# Patient Record
Sex: Male | Born: 1935
Health system: Southern US, Community
[De-identification: ages and names within clinical notes are randomized; demographics above are authoritative.]

## PROBLEM LIST (undated history)

## (undated) DIAGNOSIS — F329 Major depressive disorder, single episode, unspecified: Secondary | ICD-10-CM

## (undated) DIAGNOSIS — C801 Malignant (primary) neoplasm, unspecified: Secondary | ICD-10-CM

## (undated) DIAGNOSIS — F32A Depression, unspecified: Secondary | ICD-10-CM

## (undated) DIAGNOSIS — I219 Acute myocardial infarction, unspecified: Secondary | ICD-10-CM

## (undated) DIAGNOSIS — I2510 Atherosclerotic heart disease of native coronary artery without angina pectoris: Secondary | ICD-10-CM

## (undated) DIAGNOSIS — Z87442 Personal history of urinary calculi: Secondary | ICD-10-CM

## (undated) DIAGNOSIS — K219 Gastro-esophageal reflux disease without esophagitis: Secondary | ICD-10-CM

## (undated) DIAGNOSIS — I10 Essential (primary) hypertension: Secondary | ICD-10-CM

## (undated) HISTORY — PX: OTHER SURGICAL HISTORY: SHX169

## (undated) HISTORY — PX: CORONARY ANGIOPLASTY: SHX604

---

## 2004-12-14 ENCOUNTER — Ambulatory Visit: Payer: Self-pay | Admitting: Urology

## 2004-12-16 ENCOUNTER — Ambulatory Visit: Payer: Self-pay | Admitting: Urology

## 2004-12-27 ENCOUNTER — Ambulatory Visit: Payer: Self-pay | Admitting: Internal Medicine

## 2005-04-14 ENCOUNTER — Ambulatory Visit: Payer: Self-pay | Admitting: Orthopaedic Surgery

## 2008-07-16 ENCOUNTER — Ambulatory Visit: Payer: Self-pay | Admitting: Cardiology

## 2009-01-26 ENCOUNTER — Ambulatory Visit: Payer: Self-pay | Admitting: Internal Medicine

## 2009-02-25 ENCOUNTER — Ambulatory Visit: Payer: Self-pay

## 2009-07-07 ENCOUNTER — Ambulatory Visit: Payer: Self-pay | Admitting: Orthopedic Surgery

## 2009-07-09 ENCOUNTER — Ambulatory Visit: Payer: Self-pay | Admitting: Orthopedic Surgery

## 2009-11-22 ENCOUNTER — Emergency Department: Payer: Self-pay | Admitting: Unknown Physician Specialty

## 2009-11-24 ENCOUNTER — Ambulatory Visit: Payer: Self-pay | Admitting: Urology

## 2009-12-03 ENCOUNTER — Ambulatory Visit: Payer: Self-pay | Admitting: Urology

## 2009-12-08 ENCOUNTER — Ambulatory Visit: Payer: Self-pay | Admitting: Urology

## 2010-05-31 ENCOUNTER — Ambulatory Visit: Payer: Self-pay | Admitting: Urology

## 2011-01-10 ENCOUNTER — Ambulatory Visit: Payer: Self-pay | Admitting: Cardiology

## 2012-01-31 ENCOUNTER — Inpatient Hospital Stay: Payer: Self-pay | Admitting: Cardiology

## 2012-01-31 LAB — CBC WITH DIFFERENTIAL/PLATELET
Basophil #: 0.1 10*3/uL (ref 0.0–0.1)
Eosinophil %: 3.5 %
HCT: 43 % (ref 40.0–52.0)
Lymphocyte #: 2.3 10*3/uL (ref 1.0–3.6)
MCH: 31.8 pg (ref 26.0–34.0)
MCV: 93 fL (ref 80–100)
Monocyte #: 1 x10 3/mm (ref 0.2–1.0)
Monocyte %: 10 %
Neutrophil #: 6.1 10*3/uL (ref 1.4–6.5)
Neutrophil %: 62.5 %
Platelet: 163 10*3/uL (ref 150–440)
RBC: 4.62 10*6/uL (ref 4.40–5.90)
RDW: 13.6 % (ref 11.5–14.5)
WBC: 9.7 10*3/uL (ref 3.8–10.6)

## 2012-01-31 LAB — BASIC METABOLIC PANEL
Anion Gap: 8 (ref 7–16)
BUN: 23 mg/dL — ABNORMAL HIGH (ref 7–18)
Co2: 28 mmol/L (ref 21–32)
Creatinine: 1.11 mg/dL (ref 0.60–1.30)
EGFR (African American): >60
EGFR (Non-African Amer.): >60
Glucose: 83 mg/dL (ref 65–99)
Osmolality: 288 (ref 275–301)
Sodium: 143 mmol/L (ref 136–145)

## 2012-01-31 LAB — CK TOTAL AND CKMB (NOT AT ARMC)
CK, Total: 84 U/L (ref 35–232)
CK-MB: 0.9 ng/mL (ref 0.5–3.6)

## 2012-02-01 LAB — BASIC METABOLIC PANEL
Anion Gap: 11 (ref 7–16)
BUN: 21 mg/dL — ABNORMAL HIGH (ref 7–18)
Chloride: 107 mmol/L (ref 98–107)
Creatinine: 0.95 mg/dL (ref 0.60–1.30)
EGFR (African American): >60
Glucose: 88 mg/dL (ref 65–99)
Osmolality: 286 (ref 275–301)
Potassium: 4.4 mmol/L (ref 3.5–5.1)

## 2012-02-01 LAB — CBC WITH DIFFERENTIAL/PLATELET
Basophil #: 0.1 10*3/uL (ref 0.0–0.1)
Eosinophil #: 0.4 10*3/uL (ref 0.0–0.7)
HGB: 14.1 g/dL (ref 13.0–18.0)
Lymphocyte %: 27.1 %
MCH: 32 pg (ref 26.0–34.0)
MCV: 93 fL (ref 80–100)
Monocyte #: 0.9 x10 3/mm (ref 0.2–1.0)
Neutrophil #: 5.8 10*3/uL (ref 1.4–6.5)
Neutrophil %: 59.5 %
Platelet: 151 10*3/uL (ref 150–440)
RBC: 4.41 10*6/uL (ref 4.40–5.90)
WBC: 9.8 10*3/uL (ref 3.8–10.6)

## 2012-02-01 LAB — CK TOTAL AND CKMB (NOT AT ARMC)
CK-MB: 0.8 ng/mL (ref 0.5–3.6)
CK-MB: 0.8 ng/mL (ref 0.5–3.6)

## 2013-03-04 DIAGNOSIS — N529 Male erectile dysfunction, unspecified: Secondary | ICD-10-CM | POA: Insufficient documentation

## 2013-03-04 DIAGNOSIS — Z87442 Personal history of urinary calculi: Secondary | ICD-10-CM | POA: Insufficient documentation

## 2014-01-02 DIAGNOSIS — Z9889 Other specified postprocedural states: Secondary | ICD-10-CM | POA: Insufficient documentation

## 2014-03-05 DIAGNOSIS — I10 Essential (primary) hypertension: Secondary | ICD-10-CM | POA: Insufficient documentation

## 2014-03-05 DIAGNOSIS — M5090 Cervical disc disorder, unspecified, unspecified cervical region: Secondary | ICD-10-CM | POA: Insufficient documentation

## 2014-03-05 DIAGNOSIS — E782 Mixed hyperlipidemia: Secondary | ICD-10-CM | POA: Insufficient documentation

## 2014-03-24 ENCOUNTER — Ambulatory Visit: Payer: Self-pay | Admitting: Cardiology

## 2014-05-28 DIAGNOSIS — E780 Pure hypercholesterolemia: Secondary | ICD-10-CM | POA: Diagnosis not present

## 2014-05-28 DIAGNOSIS — I2510 Atherosclerotic heart disease of native coronary artery without angina pectoris: Secondary | ICD-10-CM | POA: Diagnosis not present

## 2014-05-28 DIAGNOSIS — Z0000 Encounter for general adult medical examination without abnormal findings: Secondary | ICD-10-CM | POA: Diagnosis not present

## 2014-05-28 DIAGNOSIS — L570 Actinic keratosis: Secondary | ICD-10-CM | POA: Diagnosis not present

## 2014-07-29 DIAGNOSIS — I10 Essential (primary) hypertension: Secondary | ICD-10-CM | POA: Diagnosis not present

## 2014-07-29 DIAGNOSIS — R001 Bradycardia, unspecified: Secondary | ICD-10-CM | POA: Diagnosis not present

## 2014-07-29 DIAGNOSIS — I2510 Atherosclerotic heart disease of native coronary artery without angina pectoris: Secondary | ICD-10-CM | POA: Diagnosis not present

## 2014-07-29 DIAGNOSIS — Z9889 Other specified postprocedural states: Secondary | ICD-10-CM | POA: Diagnosis not present

## 2014-08-19 NOTE — H&P (Signed)
PATIENT NAME:  Patrick Wilson, Patrick Wilson MR#:  [id number] DATE OF BIRTH:  Nov 14, 1933  DATE OF ADMISSION:  01/31/2012  PRIMARY CARE PHYSICIAN:  Dr. Emily Filbert  CHIEF COMPLAINT: Chest pain.   HISTORY OF PRESENT ILLNESS: The patient is a 81 year old gentleman with known history of coronary artery disease admitted with a prolonged episode of chest pain. The patient has a history of coronary stent in the mid right coronary artery on 01/10/2011. The patient has had recent chest discomfort and underwent ETT sestamibi study 10/24/2011 which revealed apical wall ischemia. Cardiac catheterization was deferred at that time. Yesterday the patient was in his usual state of health and experienced substernal chest discomfort described as a pressure sensation. There was no associated radiation. This episode lasted for approximately two hours. The patient felt somewhat weak and lightheaded afterwards. He has some mild chest discomfort today but much less than yesterday.   PAST MEDICAL HISTORY:  1. Status post bare metal Vision stent mid right coronary artery on 01/10/2011.  2. Hyperlipidemia.   MEDICATIONS:  1. Aspirin 81 mg daily.  2. Clopidogrel 75 mg daily.  3. Lipitor 20 mg daily.  4. Imdur 30 mg daily.  5. Metoprolol succinate 50 mg daily.  6. Nitroglycerin 0.4 mg p.r.n.  7. Trazodone 50 mg at bedtime.  8. Omeprazole 20 mg daily.  9. Etodolac 400 mg b.i.d.  10. Celexa 20 mg daily.   SOCIAL HISTORY: The patient is married. He has two children. He quit smoking 30 years ago.   FAMILY HISTORY: The patient has a sister in her 3s status post myocardial infarction.   REVIEW OF SYSTEMS: CONSTITUTIONAL: No fever or chills. EYES: No blurry vision.  EARS: No hearing loss. RESPIRATORY: No shortness of breath. CARDIOVASCULAR: Chest pain as described above. GI: No nausea, vomiting, diarrhea, or constipation. GU: No dysuria or hematuria. ENDOCRINE: No polyuria or polydipsia. INTEGUMENT: No rash. MUSCULOSKELETAL: No  arthralgias or myalgias. NEUROLOGIC: No focal muscle weakness or numbness. PSYCH: No depression or anxiety.   PHYSICAL EXAMINATION:  VITAL SIGNS: Weight 179.6, height 5 feet 8 inches. BMI 27, blood pressure 160/80 right arm, 150/70 left arm, pulse 46 and regular.   HEENT: Pupils equal and reactive to light and accommodation.   NECK: Supple without thyromegaly.   LUNGS: Clear.   HEART: Normal jugular venous pressure. Normal point of maximal impulse. Regular rate and rhythm. Normal S1, S2. No appreciable gallop, murmur, or rub.   ABDOMEN: Soft and nontender. Pulses were intact bilaterally.   MUSCULOSKELETAL: Normal muscle tone.   NEUROLOGIC: The patient is alert and oriented times three. Motor and sensory both grossly intact.   ACCESSORY DATA:  EKG reveals sinus arrhythmia at 52 bpm with less than 1-mm of ST elevation in leads III and aVF.   IMPRESSION: This is a 81 year old gentleman with known coronary artery disease with recent stress test showing apical wall ischemia, who now presents with a two-hour episode of chest pain with some mild residual chest discomfort with subtle EKG changes suggestive of inferior wall infarct.   RECOMMENDATIONS:  1. Admit to 2A telemetry.  2. Start heparin bolus with drip per nomogram.  3. Cycle cardiac enzymes.  4. Proceed with cardiac catheterization with selective coronary arteriography on 02/01/2012. The risks, benefits, and alternatives were explained and informed written consent obtained.    ____________________________ Isaias Cowman, MD ap:bjt D: 01/31/2012 14:37:31 ET T: 01/31/2012 15:02:04 ET JOB#: [id number]  cc: Isaias Cowman, MD, <Dictator> Isaias Cowman MD ELECTRONICALLY SIGNED 02/17/2012 15:06

## 2014-08-19 NOTE — Discharge Summary (Signed)
PATIENT NAME:  Patrick Wilson, Patrick Wilson MR#:  [id number] DATE OF BIRTH:  August 10, 1933  DATE OF ADMISSION:  01/31/2012 DATE OF DISCHARGE:  02/02/2012  ADMITTING DIAGNOSIS: Unstable angina.   FINAL DIAGNOSES: 1. Coronary artery disease.  2. Hyperlipidemia.   MEDICATIONS:  1. Aspirin 81 mg daily.  2. Clopidogrel  75 mg daily.  3. Lipitor 30 mg daily.  4. Imdur 30 mg daily.  5. Amlodipine 5 mg daily.  6. Nitroglycerin 0.4 mg p.r.n.  7. Trazodone 50 mg at bedtime.  8. Omeprazole 20 mg daily.  9. Etodolac 400 mg b.i.d.  10. Celexa 20 mg daily.   HISTORY OF PRESENT ILLNESS: Please see admission History and Physical.   HOSPITAL COURSE: The patient was admitted on 01/31/2012 after a prolonged episode of chest pain. EKG showed suggestive changes in the inferior leads. The patient was admitted to telemetry where he ruled out for myocardial infarction by CPK isoenzymes and troponin. The patient underwent cardiac catheterization on 02/01/2012. Coronary arteriography revealed insignificant left coronary artery disease. There was 60% in-stent restenosis in the distal right coronary artery. Fractional flow reserve was performed, which did not reveal physiological obstruction to flow  with an FFR measurement of 0.92. Percutaneous coronary intervention was deferred. The patient was returned to telemetry. The patient was hypertensive during cardiac catheterization and amlodipine was added to his regimen.    The patient appears to be clinically stable and was discharged home. He is scheduled to see me in followup in 1 to 2 weeks.    ____________________________ Isaias Cowman, MD ap:bjt D: 02/02/2012 08:50:23 ET T: 02/02/2012 11:42:15 ET JOB#: [id number]  cc: Isaias Cowman, MD, <Dictator> Isaias Cowman MD ELECTRONICALLY SIGNED 02/17/2012 15:06

## 2014-11-19 DIAGNOSIS — L570 Actinic keratosis: Secondary | ICD-10-CM | POA: Diagnosis not present

## 2014-11-19 DIAGNOSIS — Z0000 Encounter for general adult medical examination without abnormal findings: Secondary | ICD-10-CM | POA: Diagnosis not present

## 2014-11-19 DIAGNOSIS — E780 Pure hypercholesterolemia: Secondary | ICD-10-CM | POA: Diagnosis not present

## 2014-11-19 DIAGNOSIS — I2510 Atherosclerotic heart disease of native coronary artery without angina pectoris: Secondary | ICD-10-CM | POA: Diagnosis not present

## 2014-11-26 DIAGNOSIS — Z79899 Other long term (current) drug therapy: Secondary | ICD-10-CM | POA: Diagnosis not present

## 2014-11-26 DIAGNOSIS — I2510 Atherosclerotic heart disease of native coronary artery without angina pectoris: Secondary | ICD-10-CM | POA: Diagnosis not present

## 2014-11-26 DIAGNOSIS — R079 Chest pain, unspecified: Secondary | ICD-10-CM | POA: Diagnosis not present

## 2015-01-09 DIAGNOSIS — I2510 Atherosclerotic heart disease of native coronary artery without angina pectoris: Secondary | ICD-10-CM | POA: Diagnosis not present

## 2015-01-09 DIAGNOSIS — E782 Mixed hyperlipidemia: Secondary | ICD-10-CM | POA: Diagnosis not present

## 2015-01-09 DIAGNOSIS — R001 Bradycardia, unspecified: Secondary | ICD-10-CM | POA: Diagnosis not present

## 2015-01-09 DIAGNOSIS — I10 Essential (primary) hypertension: Secondary | ICD-10-CM | POA: Diagnosis not present

## 2015-02-18 DIAGNOSIS — R21 Rash and other nonspecific skin eruption: Secondary | ICD-10-CM | POA: Diagnosis not present

## 2015-02-18 DIAGNOSIS — I952 Hypotension due to drugs: Secondary | ICD-10-CM | POA: Diagnosis not present

## 2015-02-18 DIAGNOSIS — R5382 Chronic fatigue, unspecified: Secondary | ICD-10-CM | POA: Diagnosis not present

## 2015-02-20 ENCOUNTER — Emergency Department: Payer: Commercial Managed Care - HMO

## 2015-02-20 ENCOUNTER — Emergency Department
Admission: EM | Admit: 2015-02-20 | Discharge: 2015-02-20 | Disposition: A | Discharge status: Home / Self Care | Payer: Commercial Managed Care - HMO | Attending: Student | Admitting: Student

## 2015-02-20 ENCOUNTER — Encounter: Payer: Self-pay | Admitting: *Deleted

## 2015-02-20 DIAGNOSIS — I10 Essential (primary) hypertension: Secondary | ICD-10-CM | POA: Insufficient documentation

## 2015-02-20 DIAGNOSIS — Z880 Allergy status to penicillin: Secondary | ICD-10-CM | POA: Diagnosis not present

## 2015-02-20 DIAGNOSIS — R42 Dizziness and giddiness: Secondary | ICD-10-CM

## 2015-02-20 DIAGNOSIS — R030 Elevated blood-pressure reading, without diagnosis of hypertension: Secondary | ICD-10-CM | POA: Diagnosis not present

## 2015-02-20 LAB — URINALYSIS COMPLETE WITH MICROSCOPIC (ARMC ONLY)
BACTERIA UA: NONE SEEN
Bilirubin Urine: NEGATIVE
GLUCOSE, UA: NEGATIVE mg/dL
Ketones, ur: NEGATIVE mg/dL
Leukocytes, UA: NEGATIVE
Nitrite: NEGATIVE
Protein, ur: NEGATIVE mg/dL
SQUAMOUS EPITHELIAL / LPF: NONE SEEN
Specific Gravity, Urine: 1.01 (ref 1.005–1.030)
pH: 7 (ref 5.0–8.0)

## 2015-02-20 LAB — CBC
HCT: 45.8 % (ref 40.0–52.0)
Hemoglobin: 15.3 g/dL (ref 13.0–18.0)
MCH: 31 pg (ref 26.0–34.0)
MCHC: 33.5 g/dL (ref 32.0–36.0)
MCV: 92.4 fL (ref 80.0–100.0)
PLATELETS: 186 10*3/uL (ref 150–440)
RBC: 4.96 MIL/uL (ref 4.40–5.90)
RDW: 13.3 % (ref 11.5–14.5)
WBC: 9.9 10*3/uL (ref 3.8–10.6)

## 2015-02-20 LAB — BASIC METABOLIC PANEL
Anion gap: 6 (ref 5–15)
BUN: 22 mg/dL — AB (ref 6–20)
CHLORIDE: 107 mmol/L (ref 101–111)
CO2: 27 mmol/L (ref 22–32)
CREATININE: 1.16 mg/dL (ref 0.61–1.24)
Calcium: 9.3 mg/dL (ref 8.9–10.3)
GFR calc Af Amer: >60 mL/min (ref >60)
GFR calc non Af Amer: 58 mL/min — ABNORMAL LOW (ref >60)
Glucose, Bld: 100 mg/dL — ABNORMAL HIGH (ref 65–99)
Potassium: 4.4 mmol/L (ref 3.5–5.1)
SODIUM: 140 mmol/L (ref 135–145)

## 2015-02-20 LAB — TROPONIN I: Troponin I: <0.03 ng/mL (ref <0.031)

## 2015-02-20 MED ORDER — HYDROCHLOROTHIAZIDE 12.5 MG PO CAPS: 12.5000 mg | ORAL_CAPSULE | Freq: Every day | ORAL | Status: DC

## 2015-02-20 NOTE — ED Provider Notes (Signed)
[facility] Emergency Department Provider Note  ____________________________________________  Time seen: Approximately 5:44 PM  I have reviewed the triage vital signs and the nursing notes.   HISTORY  Chief Complaint Hypertension    HPI Patrick Wilson is a 81 y.o. male with history of coronary artery disease status post stents who presents for evaluation of hypertension, lightheadedness, mild headache today, gradual onset, constant since onset. Currently his lightheadedness and headache have resolved. Hee reports that earlier today he began feeling slightly lightheaded, he took his blood pressure and it was elevated. He went to the fire department and it was 172/108 when it was checked. He had a mild headache which is now resolved. No chest pain, no difficulty breathing, no vision change, no numbness, weakness, speech difficulty. He has otherwise been in his usual state of health. No history of hypertension. No modifying factors. Currently he feels well.   No past medical history on file.  There are no active problems to display for this patient.   Past Surgical History  Procedure Laterality Date  . Cardiac stents      No current outpatient prescriptions on file.  Allergies Penicillins  No family history on file.  Social History Social History  Substance Use Topics  . Smoking status: Never Smoker   . Smokeless tobacco: None  . Alcohol Use: No    Review of Systems Constitutional: No fever/chills Eyes: No visual changes. ENT: No sore throat. Cardiovascular: Denies chest pain. Respiratory: Denies shortness of breath. Gastrointestinal: No abdominal pain.  No nausea, no vomiting.  No diarrhea.  No constipation. Genitourinary: Negative for dysuria. Musculoskeletal: Negative for back pain. Skin: Negative for rash. Neurological: Positive for mild headahce, no focal weakness or numbness.  10-point ROS otherwise  negative.  ____________________________________________   PHYSICAL EXAM:  VITAL SIGNS: ED Triage Vitals  Enc Vitals Group     BP 02/20/15 1544 158/87 mmHg     Pulse Rate 02/20/15 1544 81     Resp --      Temp 02/20/15 1544 97.3 F (36.3 C)     Temp Source 02/20/15 1544 Oral     SpO2 02/20/15 1544 97 %     Weight 02/20/15 1544 178 lb (80.74 kg)     Height 02/20/15 1544 5\' 8"  (1.727 m)     Head Cir --      Peak Flow --      Pain Score --      Pain Loc --      Pain Edu? --      Excl. in [place]? --     Constitutional: Alert and oriented. Well appearing and in no acute distress. Eyes: Conjunctivae are normal. PERRL. EOMI. Head: Atraumatic. Nose: No congestion/rhinnorhea. Mouth/Throat: Mucous membranes are moist.  Oropharynx non-erythematous. Neck: No stridor.   Cardiovascular: Normal rate, regular rhythm. Grossly normal heart sounds.  Good peripheral circulation. Respiratory: Normal respiratory effort.  No retractions. Lungs CTAB. Gastrointestinal: Soft and nontender. No distention. No abdominal bruits. No CVA tenderness. Genitourinary: deferred Musculoskeletal: No lower extremity tenderness nor edema.  No joint effusions. Neurologic:  Normal speech and language. No gross focal neurologic deficits are appreciated. No gait instability. 5 out of 5 strength in bilateral upper and lower extremities, sensation intact to light touch throughout, cranial nerves II through XII intact, normal finger-nose-finger. Normal ambulation. Skin:  Skin is warm, dry and intact. No rash noted. Psychiatric: Mood and affect are normal. Speech and behavior are normal.  ____________________________________________   LABS (all  labs ordered are listed, but only abnormal results are displayed)  Labs Reviewed  BASIC METABOLIC PANEL - Abnormal; Notable for the following:    Glucose, Bld 100 (*)    BUN 22 (*)    GFR calc non Af Amer 58 (*)    All other components within normal limits  CBC  TROPONIN I   URINALYSIS COMPLETEWITH MICROSCOPIC ([facility] ONLY)  CBG MONITORING, ED   ____________________________________________  EKG  ED ECG REPORT I, Joanne Gavel, the attending physician, personally viewed and interpreted this ECG.   Date: 02/20/2015  EKG Time: 15:50  Rate: 74  Rhythm: sinus rhythm with 1st degree AV block  Axis: normal  Intervals:none  ST&T Change: No acute ST elevation.  ____________________________________________  RADIOLOGY  CXR  IMPRESSION: No acute abnormalities.   CT head  IMPRESSION: No evidence of acute intracranial abnormality.  Mild small vessel ischemic changes.  ____________________________________________   PROCEDURES  Procedure(s) performed: None  Critical Care performed: No  ____________________________________________   INITIAL IMPRESSION / ASSESSMENT AND PLAN / ED COURSE  Pertinent labs & imaging results that were available during my care of the patient were reviewed by me and considered in my medical decision making (see chart for details).  Patrick Wilson is a 81 y.o. male with history of coronary artery disease status post stents who presents for evaluation of hypertension, lightheadedness, mild headache today, gradual onset, constant since onset. On exam, he is very well-appearing and in no acute distress. Blood pressure 158/87 without any intervention in the ER. He is afebrile and vital signs are otherwise stable. He has an intact neurological exam and no planes at this time. EKG reassuring. Labs reviewed. Normal BMP, normal CBC, troponin negative. Urinalysis without proteinuria. Chest x-ray clear. CT head negative for any acute intracranial process. Suspect mild hypertension without any evidence of end organ dysfunction. Will start on low-dose HCTZ. Discussed return precautions and he will follow-up with his primary care doctor on Monday morning. He and his family at bedside are comfortable with the discharge  plan. ____________________________________________   FINAL CLINICAL IMPRESSION(S) / ED DIAGNOSES  Final diagnoses:  Essential hypertension  Lightheadedness      Joanne Gavel, MD 02/20/15 1929

## 2015-02-20 NOTE — ED Notes (Signed)
Pt reports feeling weak and reports "stubbling" while at home. Pt denies dizziness at this time. Pt is talking and joking with staff and presents in no acute distress.

## 2015-02-20 NOTE — ED Notes (Signed)
Pt reports being light headed, pt went to the fire station had blood check ; 172/108, pt denies a history of hypertension

## 2015-02-23 DIAGNOSIS — I10 Essential (primary) hypertension: Secondary | ICD-10-CM | POA: Diagnosis not present

## 2015-04-17 DIAGNOSIS — J0110 Acute frontal sinusitis, unspecified: Secondary | ICD-10-CM | POA: Diagnosis not present

## 2015-04-17 DIAGNOSIS — I10 Essential (primary) hypertension: Secondary | ICD-10-CM | POA: Diagnosis not present

## 2015-05-19 DIAGNOSIS — R001 Bradycardia, unspecified: Secondary | ICD-10-CM | POA: Diagnosis not present

## 2015-05-19 DIAGNOSIS — Z9889 Other specified postprocedural states: Secondary | ICD-10-CM | POA: Diagnosis not present

## 2015-05-19 DIAGNOSIS — R079 Chest pain, unspecified: Secondary | ICD-10-CM | POA: Diagnosis not present

## 2015-05-19 DIAGNOSIS — I2510 Atherosclerotic heart disease of native coronary artery without angina pectoris: Secondary | ICD-10-CM | POA: Diagnosis not present

## 2015-05-19 DIAGNOSIS — I10 Essential (primary) hypertension: Secondary | ICD-10-CM | POA: Diagnosis not present

## 2015-05-19 DIAGNOSIS — E782 Mixed hyperlipidemia: Secondary | ICD-10-CM | POA: Diagnosis not present

## 2015-05-22 DIAGNOSIS — R079 Chest pain, unspecified: Secondary | ICD-10-CM | POA: Diagnosis not present

## 2015-05-22 DIAGNOSIS — I2510 Atherosclerotic heart disease of native coronary artery without angina pectoris: Secondary | ICD-10-CM | POA: Diagnosis not present

## 2015-05-28 DIAGNOSIS — Z125 Encounter for screening for malignant neoplasm of prostate: Secondary | ICD-10-CM | POA: Diagnosis not present

## 2015-05-28 DIAGNOSIS — Z79899 Other long term (current) drug therapy: Secondary | ICD-10-CM | POA: Diagnosis not present

## 2015-06-02 DIAGNOSIS — E782 Mixed hyperlipidemia: Secondary | ICD-10-CM | POA: Diagnosis not present

## 2015-06-02 DIAGNOSIS — R001 Bradycardia, unspecified: Secondary | ICD-10-CM | POA: Diagnosis not present

## 2015-06-02 DIAGNOSIS — I10 Essential (primary) hypertension: Secondary | ICD-10-CM | POA: Diagnosis not present

## 2015-06-02 DIAGNOSIS — R0602 Shortness of breath: Secondary | ICD-10-CM | POA: Diagnosis not present

## 2015-06-02 DIAGNOSIS — R079 Chest pain, unspecified: Secondary | ICD-10-CM | POA: Diagnosis not present

## 2015-06-02 DIAGNOSIS — I2510 Atherosclerotic heart disease of native coronary artery without angina pectoris: Secondary | ICD-10-CM | POA: Diagnosis not present

## 2015-06-02 DIAGNOSIS — E7800 Pure hypercholesterolemia, unspecified: Secondary | ICD-10-CM | POA: Diagnosis not present

## 2015-06-02 DIAGNOSIS — Z9889 Other specified postprocedural states: Secondary | ICD-10-CM | POA: Diagnosis not present

## 2015-06-04 DIAGNOSIS — Z0000 Encounter for general adult medical examination without abnormal findings: Secondary | ICD-10-CM | POA: Diagnosis not present

## 2015-07-10 DIAGNOSIS — Z9889 Other specified postprocedural states: Secondary | ICD-10-CM | POA: Diagnosis not present

## 2015-07-10 DIAGNOSIS — I2510 Atherosclerotic heart disease of native coronary artery without angina pectoris: Secondary | ICD-10-CM | POA: Diagnosis not present

## 2015-07-10 DIAGNOSIS — I10 Essential (primary) hypertension: Secondary | ICD-10-CM | POA: Diagnosis not present

## 2015-07-10 DIAGNOSIS — E782 Mixed hyperlipidemia: Secondary | ICD-10-CM | POA: Diagnosis not present

## 2015-10-02 DIAGNOSIS — I2510 Atherosclerotic heart disease of native coronary artery without angina pectoris: Secondary | ICD-10-CM | POA: Diagnosis not present

## 2015-10-02 DIAGNOSIS — I10 Essential (primary) hypertension: Secondary | ICD-10-CM | POA: Diagnosis not present

## 2015-10-02 DIAGNOSIS — Z9889 Other specified postprocedural states: Secondary | ICD-10-CM | POA: Diagnosis not present

## 2015-11-13 ENCOUNTER — Ambulatory Visit
Admission: RE | Admit: 2015-11-13 | Discharge: 2015-11-13 | Disposition: A | Discharge status: Home / Self Care | Payer: Commercial Managed Care - HMO | Source: Ambulatory Visit | Attending: Urology | Admitting: Urology

## 2015-11-13 ENCOUNTER — Ambulatory Visit (INDEPENDENT_AMBULATORY_CARE_PROVIDER_SITE_OTHER): Payer: Commercial Managed Care - HMO | Admitting: Urology

## 2015-11-13 VITALS — Ht 68.0 in | Wt 176.4 lb

## 2015-11-13 DIAGNOSIS — R109 Unspecified abdominal pain: Secondary | ICD-10-CM | POA: Insufficient documentation

## 2015-11-13 DIAGNOSIS — Z87442 Personal history of urinary calculi: Secondary | ICD-10-CM

## 2015-11-13 DIAGNOSIS — N200 Calculus of kidney: Secondary | ICD-10-CM

## 2015-11-13 DIAGNOSIS — I7090 Unspecified atherosclerosis: Secondary | ICD-10-CM | POA: Insufficient documentation

## 2015-11-13 LAB — URINALYSIS, COMPLETE
BILIRUBIN UA: NEGATIVE
Glucose, UA: NEGATIVE
Leukocytes, UA: NEGATIVE
Nitrite, UA: NEGATIVE
Specific Gravity, UA: 1.025 (ref 1.005–1.030)
UUROB: 0.2 mg/dL (ref 0.2–1.0)
pH, UA: 6 (ref 5.0–7.5)

## 2015-11-13 LAB — MICROSCOPIC EXAMINATION

## 2015-11-13 MED ORDER — OXYCODONE-ACETAMINOPHEN 5-325 MG PO TABS: 1.0000 | ORAL_TABLET | ORAL | Status: DC | PRN

## 2015-11-13 MED ORDER — TAMSULOSIN HCL 0.4 MG PO CAPS: 0.4000 mg | ORAL_CAPSULE | Freq: Every day | ORAL | Status: DC

## 2015-11-13 NOTE — Progress Notes (Signed)
11/13/2015 12:51 PM   Patrick Wilson 05/10/1933 [id number]  Referring provider: Rusty Aus, MD [facility] [place], [place] [address]  Chief Complaint  Patient presents with  . New Patient (Initial Visit)    Renal Stone    HPI: The patient is a 82 year old gentleman with a past medical history of nephrolithiasis who 2 weeks ago felt like he was passing a stone on the left side. He felt intense sharp pain in the left side which is since resolved. He said stones 3 times in the past. 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 He also has had laser lithotripsy 1 and ESWL times one.  X-ray today shows a stone approximately 5 mm in size on the left lateral to L4.He also has nonobstructing left renal stones.  PMH: No past medical history on file.  Surgical History: Past Surgical History  Procedure Laterality Date  . Cardiac stents      Home Medications:    Medication List       This list is accurate as of: 11/13/15 12:51 PM.  Always use your most recent med list.               amLODipine 5 MG tablet  Commonly known as:  NORVASC  Take 5 mg by mouth at bedtime.     aspirin EC 81 MG tablet  Take 81 mg by mouth daily.     atorvastatin 20 MG tablet  Commonly known as:  LIPITOR  Take 20 mg by mouth at bedtime.     clopidogrel 75 MG tablet  Commonly known as:  PLAVIX  Take 75 mg by mouth daily.     Denture Adhesive Crea     hydrochlorothiazide 12.5 MG capsule  Commonly known as:  MICROZIDE  Take 1 capsule (12.5 mg total) by mouth daily.     isosorbide mononitrate 60 MG 24 hr tablet  Commonly known as:  IMDUR  Take by mouth. Reported on 11/13/2015     lisinopril-hydrochlorothiazide 20-12.5 MG tablet  Commonly known as:  PRINZIDE,ZESTORETIC  Take by mouth.     meloxicam 15 MG tablet  Commonly known as:  MOBIC  Take 15 mg by mouth at bedtime.     nitroGLYCERIN 0.4 MG SL tablet  Commonly known as:  NITROSTAT  Place 0.4 mg under the tongue every 5 (five) minutes as needed for chest pain. Reported on 11/13/2015     omeprazole 20 MG capsule  Commonly known as:  PRILOSEC  Take 20 mg by mouth at bedtime.     oxyCODONE-acetaminophen 5-325 MG tablet  Commonly known as:  ROXICET  Take 1 tablet by mouth every 4 (four) hours as needed for severe pain.     Phenylephrine-Acetaminophen 5-325 MG Tabs     tamsulosin 0.4 MG Caps capsule  Commonly known as:  FLOMAX  Take 1 capsule (0.4 mg total) by mouth daily.     traZODone 50 MG tablet  Commonly known as:  DESYREL  Take 50 mg by mouth at bedtime.     VITAMIN B COMPLEX PO        Allergies:  Allergies  Allergen Reactions  . Penicillins Rash and Other (See Comments)    Has patient had a PCN reaction causing immediate rash, facial/tongue/throat swelling, SOB or lightheadedness with hypotension: No Has patient had a PCN reaction causing severe rash involving mucus membranes or skin necrosis:  No Has patient had a PCN reaction that required hospitalization No Has patient had a PCN reaction occurring within the last 10 years: No If all of the above answers are "NO", then may proceed with Cephalosporin use.    Family History: No family history on file.  Social History:  reports that he has never smoked. He does not have any smokeless tobacco history on file. He reports that he does not drink alcohol. His drug history is not on file.  ROS: UROLOGY Frequent Urination?: Yes Hard to postpone urination?: Yes Burning/pain with urination?: No Get up at night to urinate?: Yes Leakage of urine?: Yes Urine stream starts and stops?: Yes Trouble starting stream?: No Do you have to strain to urinate?: No Blood in urine?: No Urinary tract infection?: No Sexually transmitted disease?: No Injury to kidneys or bladder?: No Painful intercourse?: No Weak stream?: No Erection problems?:  Yes Penile pain?: No  Gastrointestinal Nausea?: No Vomiting?: No Indigestion/heartburn?: No Diarrhea?: No Constipation?: Yes  Constitutional Fever: No Night sweats?: No Weight loss?: No Fatigue?: Yes  Skin Skin rash/lesions?: No Itching?: Yes  Eyes Blurred vision?: No Double vision?: No  Ears/Nose/Throat Sore throat?: No Sinus problems?: No  Hematologic/Lymphatic Swollen glands?: No Easy bruising?: No  Cardiovascular Leg swelling?: No Chest pain?: Yes  Respiratory Cough?: No Shortness of breath?: No  Endocrine Excessive thirst?: No  Musculoskeletal Back pain?: Yes Joint pain?: No  Neurological Headaches?: No Dizziness?: No  Psychologic Depression?: No Anxiety?: No  Physical Exam: Ht 5\' 8"  (1.727 m)  Wt 176 lb 6.4 oz (80.015 kg)  BMI 26.83 kg/m2  Constitutional:  Alert and oriented, No acute distress. HEENT: [place] AT, moist mucus membranes.  Trachea midline, no masses. Cardiovascular: No clubbing, cyanosis, or edema. Respiratory: Normal respiratory effort, no increased work of breathing. GI: Abdomen is soft, nontender, nondistended, no abdominal masses GU: No CVA tenderness.  Skin: No rashes, bruises or suspicious lesions. Lymph: No cervical or inguinal adenopathy. Neurologic: Grossly intact, no focal deficits, moving all 4 extremities. Psychiatric: Normal mood and affect.  Laboratory Data: Lab Results  Component Value Date   WBC 9.9 02/20/2015   HGB 15.3 02/20/2015   HCT 45.8 02/20/2015   MCV 92.4 02/20/2015   PLT 186 02/20/2015    Lab Results  Component Value Date   CREATININE 1.16 02/20/2015    No results found for: PSA  No results found for: TESTOSTERONE  No results found for: HGBA1C  Urinalysis    Component Value Date/Time   COLORURINE STRAW* 02/20/2015 1842   APPEARANCEUR CLEAR* 02/20/2015 1842   LABSPEC 1.010 02/20/2015 1842   PHURINE 7.0 02/20/2015 1842   GLUCOSEU NEGATIVE 02/20/2015 1842   HGBUR 1+* 02/20/2015  1842   BILIRUBINUR NEGATIVE 02/20/2015 1842   KETONESUR NEGATIVE 02/20/2015 1842   PROTEINUR NEGATIVE 02/20/2015 1842   NITRITE NEGATIVE 02/20/2015 1842   LEUKOCYTESUR NEGATIVE 02/20/2015 1842    Pertinent Imaging: X-ray today shows a stone approximately 5 mm in size on the left lateral to L4.He also has nonobstructing left renal stones.  Assessment & Plan:   1. Left ureteral stone 2. Left renal stone 3. Microscopic hematuria The patient has a left ureteral stone approximately 3 mm in width and 6 mm in length. I discussed treatment options which include watchful waiting with medical expulsive therapy, ureteroscopy, lithotripsy. He also has nonobstructing left renal stones. I did discuss ureteroscopy is awaited render him stone free. He has had this in the past and did not tolerate the procedure well  especially the stent afterwards. He is interested in medical expulsive therapy this time. He was given Flomax, pain medications, and a strainer. He will in the office if he develops unexpected fevers. He becomes uncontrolled. He'll follow-up in 2 weeks for KUB. He may decide intervention around that time he does not pass the stone.  His minimal microscopic hematuria is likely from the stone that he is currently passing   Return in about 2 weeks (around 11/27/2015) for KUB prior.  Nickie Retort, MD  [facility] [address], [place], [place] [address]  [phone]

## 2015-11-22 ENCOUNTER — Emergency Department
Admission: EM | Admit: 2015-11-22 | Discharge: 2015-11-23 | Disposition: A | Discharge status: Home / Self Care | Payer: Commercial Managed Care - HMO | Attending: Emergency Medicine | Admitting: Emergency Medicine

## 2015-11-22 DIAGNOSIS — I2510 Atherosclerotic heart disease of native coronary artery without angina pectoris: Secondary | ICD-10-CM | POA: Insufficient documentation

## 2015-11-22 DIAGNOSIS — I10 Essential (primary) hypertension: Secondary | ICD-10-CM | POA: Insufficient documentation

## 2015-11-22 DIAGNOSIS — N200 Calculus of kidney: Secondary | ICD-10-CM

## 2015-11-22 DIAGNOSIS — Z79899 Other long term (current) drug therapy: Secondary | ICD-10-CM | POA: Insufficient documentation

## 2015-11-22 DIAGNOSIS — R109 Unspecified abdominal pain: Secondary | ICD-10-CM | POA: Diagnosis not present

## 2015-11-22 DIAGNOSIS — Z7982 Long term (current) use of aspirin: Secondary | ICD-10-CM | POA: Diagnosis not present

## 2015-11-22 LAB — BASIC METABOLIC PANEL
Anion gap: 7 (ref 5–15)
BUN: 31 mg/dL — AB (ref 6–20)
CALCIUM: 9 mg/dL (ref 8.9–10.3)
CO2: 25 mmol/L (ref 22–32)
CREATININE: 1.33 mg/dL — AB (ref 0.61–1.24)
Chloride: 108 mmol/L (ref 101–111)
GFR calc Af Amer: 57 mL/min — ABNORMAL LOW (ref >60)
GFR calc non Af Amer: 49 mL/min — ABNORMAL LOW (ref >60)
GLUCOSE: 126 mg/dL — AB (ref 65–99)
Potassium: 3.6 mmol/L (ref 3.5–5.1)
Sodium: 140 mmol/L (ref 135–145)

## 2015-11-22 LAB — CBC
HCT: 42 % (ref 40.0–52.0)
Hemoglobin: 14.3 g/dL (ref 13.0–18.0)
MCH: 31.5 pg (ref 26.0–34.0)
MCHC: 34 g/dL (ref 32.0–36.0)
MCV: 92.8 fL (ref 80.0–100.0)
PLATELETS: 173 10*3/uL (ref 150–440)
RBC: 4.53 MIL/uL (ref 4.40–5.90)
RDW: 13.6 % (ref 11.5–14.5)
WBC: 9.6 10*3/uL (ref 3.8–10.6)

## 2015-11-22 MED ORDER — FENTANYL CITRATE (PF) 100 MCG/2ML IJ SOLN: 50.0000 ug | INTRAMUSCULAR | Status: DC | PRN

## 2015-11-22 MED ORDER — FENTANYL CITRATE (PF) 100 MCG/2ML IJ SOLN
INTRAMUSCULAR | Status: AC
  Filled 2015-11-22: qty 2

## 2015-11-22 NOTE — ED Triage Notes (Signed)
Paient reports he was told last week that he had a kidney stone.  Reports started with left flank pain today.

## 2015-11-22 NOTE — ED Notes (Signed)
Urine sent to lab 

## 2015-11-22 NOTE — ED Provider Notes (Signed)
[facility] Emergency Department Provider Note   ____________________________________________  Time seen: Approximately 11:24 PM  I have reviewed the triage vital signs and the nursing notes.   HISTORY  Chief Complaint Flank Pain    HPI DESSIE ROELOFS is a 82 y.o. male who comes into the hospital today with kidney stones. The patient reports this pain started at 5:30 to 6 PM. He reports that he has been seen by his urologist and diagnosed. He has about 5-7 stones on his left kidney. He reports that there anywhere from 2-6 mm in size. The patient reports that he has a plan to get x-rayed again next week he has not been taking anything for pain. The pain and not really been bothering him much until today. The patient has a prescription waiting for him at [facility] but did not take any medication for pain tonight and did not fill his prescriptions. Currently the patient's pain is a 0 out of 10 in intensity. He has some mild discomfort across his lower back. Reports is not that bad. The patient denies pain with urination and denies any visible blood in his urine. The patient reports that he did have some when he saw his urologist. He denies any nausea, vomiting, chest pain, shortness of breath, headache.   Past medical history Kidney stones Arthritis CAD  Patient Active Problem List   Diagnosis Date Noted  . Arterial vascular disease 11/13/2015  . Calculus of kidney 11/13/2015  . Cervical disc disease 03/05/2014  . Combined fat and carbohydrate induced hyperlipemia 03/05/2014  . Essential (primary) hypertension 03/05/2014  . History of cardiac catheterization 01/02/2014  . ED (erectile dysfunction) of organic origin 03/04/2013  . H/O urinary stone 03/04/2013    Past Surgical History:  Procedure Laterality Date  . cardiac stents          Lithotripsy       Kidney surgery  Current Outpatient Rx  . Order #: [id number] Class: Historical Med  . Order #:  [id number] Class: Historical Med  . Order #: [id number] Class: Historical Med  . Order #: [id number] Class: Historical Med  . Order #: [id number] Class: Historical Med  . Order #: [id number] Class: Historical Med  . Order #: [id number] Class: Print  . Order #: [id number] Class: Historical Med  . Order #: [id number] Class: Historical Med  . Order #: [id number] Class: Historical Med  . Order #: [id number] Class: Historical Med  . Order #: [id number] Class: Historical Med  . Order #: [id number] Class: Print  . Order #: [id number] Class: Historical Med  . Order #: [id number] Class: Print  . Order #: [id number] Class: Historical Med    Allergies Penicillins  No family history on file.  Social History Social History  Substance Use Topics  . Smoking status: Never Smoker  . Smokeless tobacco: Not on file  . Alcohol use No    Review of Systems Constitutional: No fever/chills Eyes: No visual changes. ENT: No sore throat. Cardiovascular: Denies chest pain. Respiratory: Denies shortness of breath. Gastrointestinal: No abdominal pain.  No nausea, no vomiting.  No diarrhea.  No constipation. Genitourinary: Negative for dysuria. Musculoskeletal: Left flank pain Skin: Negative for rash. Neurological: Negative for headaches, focal weakness or numbness.  10-point ROS otherwise negative.  ____________________________________________   PHYSICAL EXAM:  VITAL SIGNS: ED Triage Vitals  Enc Vitals Group     BP 11/22/15 2014 (!) 148/112     Pulse Rate 11/22/15 2014 75     Resp 11/22/15 2014 (!) 22     Temp 11/22/15 2014 97.8 F (36.6  C)     Temp Source 11/22/15 2014 Oral     SpO2 11/22/15 2014 98 %     Weight 11/22/15 2014 178 lb (80.7 kg)     Height 11/22/15 2014 5\' 8"  (1.727 m)     Head Circumference --      Peak Flow --      Pain Score 11/22/15 2015 10     Pain Loc --      Pain Edu? --      Excl. in [place]? --     Constitutional: Alert and oriented. Well appearing and in no acute distress. Eyes:  Conjunctivae are normal. PERRL. EOMI. Head: Atraumatic. Nose: No congestion/rhinnorhea. Mouth/Throat: Mucous membranes are moist.  Oropharynx non-erythematous. Cardiovascular: Normal rate, regular rhythm. Grossly normal heart sounds.  Good peripheral circulation. Respiratory: Normal respiratory effort.  No retractions. Lungs CTAB. Gastrointestinal: Soft and nontender. No distention. Mild left CVA tenderness to palpation Musculoskeletal: No lower extremity tenderness nor edema.   Neurologic:  Normal speech and language.  Skin:  Skin is warm, dry and intact.  Psychiatric: Mood and affect are normal.   ____________________________________________   LABS (all labs ordered are listed, but only abnormal results are displayed)  Labs Reviewed  URINALYSIS COMPLETEWITH MICROSCOPIC ([place]) - Abnormal; Notable for the following:       Result Value   Color, Urine YELLOW (*)    APPearance CLEAR (*)    Hgb urine dipstick 3+ (*)    Protein, ur 30 (*)    Squamous Epithelial / LPF 0-5 (*)    All other components within normal limits  BASIC METABOLIC PANEL - Abnormal; Notable for the following:    Glucose, Bld 126 (*)    BUN 31 (*)    Creatinine, Ser 1.33 (*)    GFR calc non Af Amer 49 (*)    GFR calc Af Amer 57 (*)    All other components within normal limits  CBC   ____________________________________________  EKG  None ____________________________________________  RADIOLOGY  None ____________________________________________   PROCEDURES  Procedure(s) performed: None  Procedures  Critical Care performed: No  ____________________________________________   INITIAL IMPRESSION / ASSESSMENT AND PLAN / ED COURSE  Pertinent labs & imaging results that were available during my care of the patient were reviewed by me and considered in my medical decision making (see chart for details).  This is an 82 year old male who comes into the hospital today with some left flank pain.  The patient has kidney stones and reports that he sits here to have his pain control. The patient did receive some medication and his pain is improved at this time. I am awaiting the results of his urinalysis and the patient will be dispositioned.  The patient's urinalysis does show too numerous to count red blood cells with 60-30 whites and no bacteria. As the patient does not have a significant infection he will be discharged to home. ____________________________________________   FINAL CLINICAL IMPRESSION(S) / ED DIAGNOSES  Final diagnoses:  Kidney stone  Flank pain      NEW MEDICATIONS STARTED DURING THIS VISIT:  New Prescriptions   No medications on file     Note:  This document was prepared using Dragon voice recognition software and may include unintentional dictation errors.    Loney Hering, MD 11/23/15 [phone]

## 2015-11-22 NOTE — ED Notes (Signed)
Patient to ED for flank pain and known kidney stone. Patient states he is already being treated for kidney stone that is not obstructing but he did not get his prescription filled because he wasn't hurting. Tonight he started hurting and had to come here for pain control. Currently patient is alert and oriented, pain-free and has just urinated while waiting to see RN.

## 2015-11-22 NOTE — ED Notes (Signed)
Patient reports that he is pain free at this time.

## 2015-11-23 LAB — URINALYSIS COMPLETE WITH MICROSCOPIC (ARMC ONLY)
BILIRUBIN URINE: NEGATIVE
Bacteria, UA: NONE SEEN
GLUCOSE, UA: NEGATIVE mg/dL
KETONES UR: NEGATIVE mg/dL
Leukocytes, UA: NEGATIVE
Nitrite: NEGATIVE
PH: 6 (ref 5.0–8.0)
Protein, ur: 30 mg/dL — AB
Specific Gravity, Urine: 1.016 (ref 1.005–1.030)

## 2015-11-23 NOTE — Discharge Instructions (Signed)
He was treated tonight for a kidney stone. He did receive some pain medicine here pain has resolved. Please follow back up with your urologist for further evaluation as well as to determine if you need any further intervention to treat or kidney stones. He is return if you have any further or worsening pain or any other concerns.

## 2015-11-24 ENCOUNTER — Ambulatory Visit (INDEPENDENT_AMBULATORY_CARE_PROVIDER_SITE_OTHER): Payer: Commercial Managed Care - HMO | Admitting: Urology

## 2015-11-24 ENCOUNTER — Encounter: Payer: Self-pay | Admitting: Urology

## 2015-11-24 VITALS — BP 107/73 | Ht 68.0 in | Wt 177.2 lb

## 2015-11-24 DIAGNOSIS — N200 Calculus of kidney: Secondary | ICD-10-CM | POA: Diagnosis not present

## 2015-11-24 DIAGNOSIS — N201 Calculus of ureter: Secondary | ICD-10-CM | POA: Diagnosis not present

## 2015-11-24 NOTE — Progress Notes (Signed)
11/24/2015 4:21 PM   Patrick Wilson Jun 02, 1933 [id number]  Referring provider: Rusty Aus, MD [facility] [place], [place] [address]  Chief Complaint  Patient presents with  . Follow-up    renal stone    HPI: Patrick Wilson is a 82yo here for followup for a left ureteral calculus. He has been on MET for 2 weeks and has not passed his calculus. KUB 2 weeks ago shows a 5-31m left proximal ureteral calculus. He has intermittent left flank pain that is alleviated with narcotics. He presented to the ER 2 days ago for pain which has been better since discharge. He denies worsening LUTS.   Pt is currently on plavix.   PMH: No past medical history on file.  Surgical History: Past Surgical History:  Procedure Laterality Date  . cardiac stents      Home Medications:    Medication List       Accurate as of 11/24/15  4:21 PM. Always use your most recent med list.          amLODipine 5 MG tablet Commonly known as:  NORVASC Take 5 mg by mouth at bedtime.   aspirin EC 81 MG tablet Take 81 mg by mouth daily.   atorvastatin 20 MG tablet Commonly known as:  LIPITOR Take 20 mg by mouth at bedtime.   clopidogrel 75 MG tablet Commonly known as:  PLAVIX Take 75 mg by mouth daily.   Denture Adhesive Crea   hydrochlorothiazide 12.5 MG capsule Commonly known as:  MICROZIDE Take 1 capsule (12.5 mg total) by mouth daily.   nitroGLYCERIN 0.4 MG SL tablet Commonly known as:  NITROSTAT Place 0.4 mg under the tongue every 5 (five) minutes as needed for chest pain. Reported on 11/13/2015   omeprazole 20 MG capsule Commonly known as:  PRILOSEC Take 20 mg by mouth at bedtime.   oxyCODONE-acetaminophen 5-325 MG tablet Commonly known as:  ROXICET Take 1 tablet by mouth every 4 (four) hours as needed for severe pain.   Phenylephrine-Acetaminophen 5-325 MG Tabs   tamsulosin 0.4 MG Caps capsule Commonly known as:  FLOMAX Take 1 capsule (0.4  mg total) by mouth daily.   traZODone 50 MG tablet Commonly known as:  DESYREL Take 50 mg by mouth at bedtime.   VITAMIN B COMPLEX PO       Allergies:  Allergies  Allergen Reactions  . Penicillins Rash and Other (See Comments)    Has patient had a PCN reaction causing immediate rash, facial/tongue/throat swelling, SOB or lightheadedness with hypotension: No Has patient had a PCN reaction causing severe rash involving mucus membranes or skin necrosis: No Has patient had a PCN reaction that required hospitalization No Has patient had a PCN reaction occurring within the last 10 years: No If all of the above answers are "NO", then may proceed with Cephalosporin use.    Family History: No family history on file.  Social History:  reports that he has never smoked. He does not have any smokeless tobacco history on file. He reports that he does not drink alcohol. His drug history is not on file.  ROS:                                        Physical Exam: BP 107/73 (BP Location: Left Arm, Patient Position: Sitting, Cuff Size: Large)   Ht '5\' 8"'  (  1.727 m)   Wt 80.4 kg (177 lb 3.2 oz)   BMI 26.94 kg/m   Constitutional:  Alert and oriented, No acute distress. HEENT: [place] AT, moist mucus membranes.  Trachea midline, no masses. Cardiovascular: No clubbing, cyanosis, or edema. Respiratory: Normal respiratory effort, no increased work of breathing. GI: Abdomen is soft, nontender, nondistended, no abdominal masses GU: No CVA tenderness.  Skin: No rashes, bruises or suspicious lesions. Lymph: No cervical or inguinal adenopathy. Neurologic: Grossly intact, no focal deficits, moving all 4 extremities. Psychiatric: Normal mood and affect.  Laboratory Data: Lab Results  Component Value Date   WBC 9.6 11/22/2015   HGB 14.3 11/22/2015   HCT 42.0 11/22/2015   MCV 92.8 11/22/2015   PLT 173 11/22/2015    Lab Results  Component Value Date   CREATININE 1.33 (H)  11/22/2015    No results found for: PSA  No results found for: TESTOSTERONE  No results found for: HGBA1C  Urinalysis    Component Value Date/Time   COLORURINE YELLOW (A) 11/22/2015 2346   APPEARANCEUR CLEAR (A) 11/22/2015 2346   APPEARANCEUR Clear 11/13/2015 1135   LABSPEC 1.016 11/22/2015 2346   PHURINE 6.0 11/22/2015 2346   GLUCOSEU NEGATIVE 11/22/2015 2346   HGBUR 3+ (A) 11/22/2015 2346   BILIRUBINUR NEGATIVE 11/22/2015 2346   BILIRUBINUR Negative 11/13/2015 1135   KETONESUR NEGATIVE 11/22/2015 2346   PROTEINUR 30 (A) 11/22/2015 2346   NITRITE NEGATIVE 11/22/2015 2346   LEUKOCYTESUR NEGATIVE 11/22/2015 2346   LEUKOCYTESUR Negative 11/13/2015 1135    Pertinent Imaging: KUB  Assessment & Plan:    1. Ureteral calculus -schedule for ESWL  There are no diagnoses linked to this encounter.  No Follow-up on file.  Nicolette Bang, MD  [facility] [address] [place], [place] [address] [phone]

## 2015-11-26 ENCOUNTER — Telehealth: Payer: Self-pay | Admitting: Radiology

## 2015-11-26 DIAGNOSIS — Z0000 Encounter for general adult medical examination without abnormal findings: Secondary | ICD-10-CM | POA: Diagnosis not present

## 2015-11-26 NOTE — Telephone Encounter (Signed)
Notified pt that per Dr Saralyn Pilar he should hold Plavix beginning 11/28/15 & hold ASA 81mg  beginning 11/30/15 prior to ESWL scheduled 12/03/15 with Dr Erlene Quan. Pt voices understanding.

## 2015-12-01 ENCOUNTER — Encounter: Payer: Self-pay | Admitting: *Deleted

## 2015-12-03 ENCOUNTER — Encounter: Payer: Self-pay | Admitting: *Deleted

## 2015-12-03 ENCOUNTER — Ambulatory Visit: Payer: Commercial Managed Care - HMO

## 2015-12-03 ENCOUNTER — Ambulatory Visit
Admission: RE | Admit: 2015-12-03 | Discharge: 2015-12-03 | Disposition: A | Discharge status: Home / Self Care | Payer: Commercial Managed Care - HMO | Source: Ambulatory Visit | Attending: Urology | Admitting: Urology

## 2015-12-03 ENCOUNTER — Encounter
Admission: RE | Disposition: A | Discharge status: Home / Self Care | Payer: Self-pay | Source: Ambulatory Visit | Attending: Urology

## 2015-12-03 DIAGNOSIS — Z79899 Other long term (current) drug therapy: Secondary | ICD-10-CM | POA: Insufficient documentation

## 2015-12-03 DIAGNOSIS — Z880 Allergy status to penicillin: Secondary | ICD-10-CM | POA: Insufficient documentation

## 2015-12-03 DIAGNOSIS — N201 Calculus of ureter: Secondary | ICD-10-CM | POA: Insufficient documentation

## 2015-12-03 DIAGNOSIS — N200 Calculus of kidney: Secondary | ICD-10-CM

## 2015-12-03 DIAGNOSIS — I252 Old myocardial infarction: Secondary | ICD-10-CM | POA: Diagnosis not present

## 2015-12-03 DIAGNOSIS — Z7982 Long term (current) use of aspirin: Secondary | ICD-10-CM | POA: Insufficient documentation

## 2015-12-03 DIAGNOSIS — Z7902 Long term (current) use of antithrombotics/antiplatelets: Secondary | ICD-10-CM | POA: Diagnosis not present

## 2015-12-03 DIAGNOSIS — Z79891 Long term (current) use of opiate analgesic: Secondary | ICD-10-CM | POA: Insufficient documentation

## 2015-12-03 DIAGNOSIS — Z955 Presence of coronary angioplasty implant and graft: Secondary | ICD-10-CM | POA: Diagnosis not present

## 2015-12-03 HISTORY — PX: EXTRACORPOREAL SHOCK WAVE LITHOTRIPSY: SHX1557

## 2015-12-03 HISTORY — DX: Acute myocardial infarction, unspecified: I21.9

## 2015-12-03 SURGERY — LITHOTRIPSY, ESWL
Anesthesia: Moderate Sedation | Laterality: Left

## 2015-12-03 MED ORDER — DIAZEPAM 5 MG PO TABS
10.0000 mg | ORAL_TABLET | ORAL | Status: AC
Start: 2015-12-03 — End: 2015-12-03
  Administered 2015-12-03: 10 mg via ORAL

## 2015-12-03 MED ORDER — OXYCODONE-ACETAMINOPHEN 5-325 MG PO TABS: 1.0000 | ORAL_TABLET | ORAL | 0 refills | Status: DC | PRN

## 2015-12-03 MED ORDER — TAMSULOSIN HCL 0.4 MG PO CAPS: 0.4000 mg | ORAL_CAPSULE | Freq: Every day | ORAL | 0 refills | Status: DC

## 2015-12-03 MED ORDER — DIPHENHYDRAMINE HCL 25 MG PO CAPS
25.0000 mg | ORAL_CAPSULE | ORAL | Status: AC
  Administered 2015-12-03: 25 mg via ORAL

## 2015-12-03 MED ORDER — DOCUSATE SODIUM 100 MG PO CAPS: 100.0000 mg | ORAL_CAPSULE | Freq: Two times a day (BID) | ORAL | 0 refills | Status: DC

## 2015-12-03 MED ORDER — CIPROFLOXACIN HCL 500 MG PO TABS
500.0000 mg | ORAL_TABLET | ORAL | Status: AC
  Administered 2015-12-03: 500 mg via ORAL

## 2015-12-03 MED ORDER — CIPROFLOXACIN HCL 500 MG PO TABS
ORAL_TABLET | ORAL | Status: AC
  Administered 2015-12-03: 500 mg via ORAL
  Filled 2015-12-03: qty 1

## 2015-12-03 MED ORDER — DIPHENHYDRAMINE HCL 25 MG PO CAPS
ORAL_CAPSULE | ORAL | Status: AC
  Administered 2015-12-03: 25 mg via ORAL
  Filled 2015-12-03: qty 1

## 2015-12-03 MED ORDER — DEXTROSE-NACL 5-0.45 % IV SOLN
INTRAVENOUS | Status: DC
  Administered 2015-12-03: 08:00:00 via INTRAVENOUS

## 2015-12-03 MED ORDER — DIAZEPAM 5 MG PO TABS
ORAL_TABLET | ORAL | Status: AC
  Administered 2015-12-03: 10 mg via ORAL
  Filled 2015-12-03: qty 2

## 2015-12-03 NOTE — Discharge Instructions (Addendum)
See [facility] discharge instructions in chart. ° °AMBULATORY SURGERY  °DISCHARGE INSTRUCTIONS ° ° °1) The drugs that you were given will stay in your system until tomorrow so for the next 24 hours you should not: ° °A) Drive an automobile °B) Make any legal decisions °C) Drink any alcoholic beverage ° ° °2) You may resume regular meals tomorrow.  Today it is better to start with liquids and gradually work up to solid foods. ° °You may eat anything you prefer, but it is better to start with liquids, then soup and crackers, and gradually work up to solid foods. ° ° °3) Please notify your doctor immediately if you have any unusual bleeding, trouble breathing, redness and pain at the surgery site, drainage, fever, or pain not relieved by medication. ° ° ° °4) Additional Instructions: ° ° ° ° ° ° ° °Please contact your physician with any problems or Same Day Surgery at [phone], Monday through Friday 6 am to 4 pm, or [place] [facility] [place] Main number at [phone]. ° ° °Lithotripsy, Care After °Refer to this sheet in the next few weeks. These instructions provide you with information on caring for yourself after your procedure. Your health care provider may also give you more specific instructions. Your treatment has been planned according to current medical practices, but problems sometimes occur. Call your health care provider if you have any problems or questions after your procedure. °WHAT TO EXPECT AFTER THE PROCEDURE  °· Your urine may have a red tinge for a few days after treatment. Blood loss is usually minimal. °· You may have soreness in the back or flank area. This usually goes away after a few days. The procedure can cause blotches or bruises on the back where the pressure wave enters the skin. These marks usually cause only minimal discomfort and should disappear in a short time. °· Stone fragments should begin to pass within 24 hours of treatment. However, a delayed passage is not  unusual. °· You may have pain, discomfort, and feel sick to your stomach (nauseated) when the crushed fragments of stone are passed down the tube from the kidney to the bladder. Stone fragments can pass soon after the procedure and may last for up to 4-8 weeks. °· A small number of patients may have severe pain when stone fragments are not able to pass, which leads to an obstruction. °· If your stone is greater than 1 inch (2.5 cm) in diameter or if you have multiple stones that have a combined diameter greater than 1 inch (2.5 cm), you may require more than one treatment. °· If you had a stent placed prior to your procedure, you may experience some discomfort, especially during urination. You may experience the pain or discomfort in your flank or back, or you may experience a sharp pain or discomfort at the base of your penis or in your lower abdomen. The discomfort usually lasts only a few minutes after urinating. °HOME CARE INSTRUCTIONS  °· Rest at home until you feel your energy improving. °· Only take over-the-counter or prescription medicines for pain, discomfort, or fever as directed by your health care provider. Depending on the type of lithotripsy, you may need to take antibiotics and anti-inflammatory medicines for a few days. °· Drink enough water and fluids to keep your urine clear or pale yellow. This helps "flush" your kidneys. It helps pass any remaining pieces of stone and prevents stones from coming back. °· Most people can resume daily   activities within 1-2 days after standard lithotripsy. It can take longer to recover from laser and percutaneous lithotripsy. °· Strain all urine through the provided strainer. Keep all particulate matter and stones for your health care provider to see. The stone may be as small as a grain of salt. It is very important to use the strainer each and every time you pass your urine. Any stones that are found can be sent to a medical lab for examination. °· Visit your  health care provider for a follow-up appointment in a few weeks. Your doctor may remove your stent if you have one. Your health care provider will also check to see whether stone particles still remain. °SEEK MEDICAL CARE IF:  °· Your pain is not relieved by medicine. °· You have a lasting nauseous feeling. °· You feel there is too much blood in the urine. °· You develop persistent problems with frequent or painful urination that does not at least partially improve after 2 days following the procedure. °· You have a congested cough. °· You feel lightheaded. °· You develop a rash or any other signs that might suggest an allergic problem. °· You develop any reaction or side effects to your medicine(s). °SEEK IMMEDIATE MEDICAL CARE IF:  °· You experience severe back or flank pain or both. °· You see nothing but blood when you urinate. °· You cannot pass any urine at all. °· You have a fever or shaking chills. °· You develop shortness of breath, difficulty breathing, or chest pain. °· You develop vomiting that will not stop after 6-8 hours. °· You have a fainting episode. °  °This information is not intended to replace advice given to you by your health care provider. Make sure you discuss any questions you have with your health care provider. °  °Document Released: 05/08/2007 Document Revised: 01/07/2015 Document Reviewed: 11/01/2012 °Elsevier Interactive Patient Education ©2016 [employer] Inc[employer] ° °

## 2015-12-03 NOTE — H&P (View-Only) (Signed)
11/24/2015 4:21 PM   Luisa Dago Aug 14, 1933 [id number]  Referring provider: Rusty Aus, MD [facility] [place], [place] [address]  Chief Complaint  Patient presents with  . Follow-up    renal stone    HPI: Mr Barletta is a 82yo here for followup for a left ureteral calculus. He has been on MET for 2 weeks and has not passed his calculus. KUB 2 weeks ago shows a 5-29m left proximal ureteral calculus. He has intermittent left flank pain that is alleviated with narcotics. He presented to the ER 2 days ago for pain which has been better since discharge. He denies worsening LUTS.   Pt is currently on plavix.   PMH: No past medical history on file.  Surgical History: Past Surgical History:  Procedure Laterality Date  . cardiac stents      Home Medications:    Medication List       Accurate as of 11/24/15  4:21 PM. Always use your most recent med list.          amLODipine 5 MG tablet Commonly known as:  NORVASC Take 5 mg by mouth at bedtime.   aspirin EC 81 MG tablet Take 81 mg by mouth daily.   atorvastatin 20 MG tablet Commonly known as:  LIPITOR Take 20 mg by mouth at bedtime.   clopidogrel 75 MG tablet Commonly known as:  PLAVIX Take 75 mg by mouth daily.   Denture Adhesive Crea   hydrochlorothiazide 12.5 MG capsule Commonly known as:  MICROZIDE Take 1 capsule (12.5 mg total) by mouth daily.   nitroGLYCERIN 0.4 MG SL tablet Commonly known as:  NITROSTAT Place 0.4 mg under the tongue every 5 (five) minutes as needed for chest pain. Reported on 11/13/2015   omeprazole 20 MG capsule Commonly known as:  PRILOSEC Take 20 mg by mouth at bedtime.   oxyCODONE-acetaminophen 5-325 MG tablet Commonly known as:  ROXICET Take 1 tablet by mouth every 4 (four) hours as needed for severe pain.   Phenylephrine-Acetaminophen 5-325 MG Tabs   tamsulosin 0.4 MG Caps capsule Commonly known as:  FLOMAX Take 1 capsule (0.4  mg total) by mouth daily.   traZODone 50 MG tablet Commonly known as:  DESYREL Take 50 mg by mouth at bedtime.   VITAMIN B COMPLEX PO       Allergies:  Allergies  Allergen Reactions  . Penicillins Rash and Other (See Comments)    Has patient had a PCN reaction causing immediate rash, facial/tongue/throat swelling, SOB or lightheadedness with hypotension: No Has patient had a PCN reaction causing severe rash involving mucus membranes or skin necrosis: No Has patient had a PCN reaction that required hospitalization No Has patient had a PCN reaction occurring within the last 10 years: No If all of the above answers are "NO", then may proceed with Cephalosporin use.    Family History: No family history on file.  Social History:  reports that he has never smoked. He does not have any smokeless tobacco history on file. He reports that he does not drink alcohol. His drug history is not on file.  ROS:                                        Physical Exam: BP 107/73 (BP Location: Left Arm, Patient Position: Sitting, Cuff Size: Large)   Ht '5\' 8"'  (  1.727 m)   Wt 80.4 kg (177 lb 3.2 oz)   BMI 26.94 kg/m   Constitutional:  Alert and oriented, No acute distress. HEENT: [place] AT, moist mucus membranes.  Trachea midline, no masses. Cardiovascular: No clubbing, cyanosis, or edema. Respiratory: Normal respiratory effort, no increased work of breathing. GI: Abdomen is soft, nontender, nondistended, no abdominal masses GU: No CVA tenderness.  Skin: No rashes, bruises or suspicious lesions. Lymph: No cervical or inguinal adenopathy. Neurologic: Grossly intact, no focal deficits, moving all 4 extremities. Psychiatric: Normal mood and affect.  Laboratory Data: Lab Results  Component Value Date   WBC 9.6 11/22/2015   HGB 14.3 11/22/2015   HCT 42.0 11/22/2015   MCV 92.8 11/22/2015   PLT 173 11/22/2015    Lab Results  Component Value Date   CREATININE 1.33 (H)  11/22/2015    No results found for: PSA  No results found for: TESTOSTERONE  No results found for: HGBA1C  Urinalysis    Component Value Date/Time   COLORURINE YELLOW (A) 11/22/2015 2346   APPEARANCEUR CLEAR (A) 11/22/2015 2346   APPEARANCEUR Clear 11/13/2015 1135   LABSPEC 1.016 11/22/2015 2346   PHURINE 6.0 11/22/2015 2346   GLUCOSEU NEGATIVE 11/22/2015 2346   HGBUR 3+ (A) 11/22/2015 2346   BILIRUBINUR NEGATIVE 11/22/2015 2346   BILIRUBINUR Negative 11/13/2015 1135   KETONESUR NEGATIVE 11/22/2015 2346   PROTEINUR 30 (A) 11/22/2015 2346   NITRITE NEGATIVE 11/22/2015 2346   LEUKOCYTESUR NEGATIVE 11/22/2015 2346   LEUKOCYTESUR Negative 11/13/2015 1135    Pertinent Imaging: KUB  Assessment & Plan:    1. Ureteral calculus -schedule for ESWL  There are no diagnoses linked to this encounter.  No Follow-up on file.  Nicolette Bang, MD  [facility] [address], [place], [place] [address] [phone]

## 2015-12-03 NOTE — Interval H&P Note (Signed)
History and Physical Interval Note:  12/03/2015 11:34 AM  Patrick Wilson  has presented today for surgery, with the diagnosis of kidney stones  The various methods of treatment have been discussed with the patient and family. After consideration of risks, benefits and other options for treatment, the patient has consented to  Procedure(s): EXTRACORPOREAL SHOCK WAVE LITHOTRIPSY (ESWL) (Left) as a surgical intervention .  The patient's history has been reviewed, patient examined, no change in status, stable for surgery.  I have reviewed the patient's chart and labs.  Questions were answered to the patient's satisfaction.     Hollice Espy

## 2015-12-04 ENCOUNTER — Ambulatory Visit: Payer: Commercial Managed Care - HMO

## 2015-12-04 DIAGNOSIS — L570 Actinic keratosis: Secondary | ICD-10-CM | POA: Diagnosis not present

## 2015-12-04 DIAGNOSIS — E782 Mixed hyperlipidemia: Secondary | ICD-10-CM | POA: Diagnosis not present

## 2015-12-04 DIAGNOSIS — I2510 Atherosclerotic heart disease of native coronary artery without angina pectoris: Secondary | ICD-10-CM | POA: Diagnosis not present

## 2015-12-16 ENCOUNTER — Ambulatory Visit: Payer: Commercial Managed Care - HMO | Admitting: Urology

## 2015-12-16 ENCOUNTER — Encounter: Payer: Self-pay | Admitting: Urology

## 2015-12-16 NOTE — Progress Notes (Deleted)
12/16/2015 2:20 PM   Patrick Wilson January 26, 1934 [id number]  Referring provider: Rusty Aus, MD [facility] [place], [place] [address]  No chief complaint on file.   HPI: ***     PMH: Past Medical History:  Diagnosis Date  . Myocardial infarction [facility])     Surgical History: Past Surgical History:  Procedure Laterality Date  . cardiac stents    . EXTRACORPOREAL SHOCK WAVE LITHOTRIPSY Left 12/03/2015   Procedure: EXTRACORPOREAL SHOCK WAVE LITHOTRIPSY (ESWL);  Surgeon: Hollice Espy, MD;  Location: [facility] ORS;  Service: Urology;  Laterality: Left;    Home Medications:    Medication List       Accurate as of 12/16/15  2:20 PM. Always use your most recent med list.          amLODipine 5 MG tablet Commonly known as:  NORVASC Take 5 mg by mouth at bedtime.   aspirin EC 81 MG tablet Take 81 mg by mouth daily.   atorvastatin 20 MG tablet Commonly known as:  LIPITOR Take 20 mg by mouth at bedtime.   clopidogrel 75 MG tablet Commonly known as:  PLAVIX Take 75 mg by mouth daily.   Denture Adhesive Crea   docusate sodium 100 MG capsule Commonly known as:  COLACE Take 1 capsule (100 mg total) by mouth 2 (two) times daily.   hydrochlorothiazide 12.5 MG capsule Commonly known as:  MICROZIDE Take 1 capsule (12.5 mg total) by mouth daily.   nitroGLYCERIN 0.4 MG SL tablet Commonly known as:  NITROSTAT Place 0.4 mg under the tongue every 5 (five) minutes as needed for chest pain. Reported on 11/13/2015   omeprazole 20 MG capsule Commonly known as:  PRILOSEC Take 20 mg by mouth at bedtime.   oxyCODONE-acetaminophen 5-325 MG tablet Commonly known as:  ROXICET Take 1 tablet by mouth every 4 (four) hours as needed for severe pain.   Phenylephrine-Acetaminophen 5-325 MG Tabs   tamsulosin 0.4 MG Caps capsule Commonly known as:  FLOMAX Take 1 capsule (0.4 mg total) by mouth daily.   traZODone 50 MG tablet Commonly known  as:  DESYREL Take 50 mg by mouth at bedtime.   VITAMIN B COMPLEX PO       Allergies:  Allergies  Allergen Reactions  . Penicillins Rash and Other (See Comments)    Has patient had a PCN reaction causing immediate rash, facial/tongue/throat swelling, SOB or lightheadedness with hypotension: No Has patient had a PCN reaction causing severe rash involving mucus membranes or skin necrosis: No Has patient had a PCN reaction that required hospitalization No Has patient had a PCN reaction occurring within the last 10 years: No If all of the above answers are "NO", then may proceed with Cephalosporin use.    Family History: No family history on file.  Social History:  reports that he has never smoked. He uses smokeless tobacco. He reports that he does not drink alcohol. His drug history is not on file.  ROS:                                        Physical Exam: There were no vitals taken for this visit.  Constitutional:  Alert and oriented, No acute distress. HEENT: [name], moist mucus membranes.  Trachea midline, no masses. Cardiovascular: No clubbing, cyanosis, or edema. Respiratory: Normal respiratory effort, no increased work of breathing.  GI: Abdomen is soft, nontender, nondistended, no abdominal masses GU: No CVA tenderness. *** Skin: No rashes, bruises or suspicious lesions. Lymph: No cervical or inguinal adenopathy. Neurologic: Grossly intact, no focal deficits, moving all 4 extremities. Psychiatric: Normal mood and affect.  Laboratory Data: Lab Results  Component Value Date   WBC 9.6 11/22/2015   HGB 14.3 11/22/2015   HCT 42.0 11/22/2015   MCV 92.8 11/22/2015   PLT 173 11/22/2015    Lab Results  Component Value Date   CREATININE 1.33 (H) 11/22/2015    No results found for: PSA  No results found for: TESTOSTERONE  No results found for: HGBA1C  Urinalysis    Component Value Date/Time   COLORURINE YELLOW (A) 11/22/2015 2346    APPEARANCEUR CLEAR (A) 11/22/2015 2346   APPEARANCEUR Clear 11/13/2015 1135   LABSPEC 1.016 11/22/2015 2346   PHURINE 6.0 11/22/2015 2346   GLUCOSEU NEGATIVE 11/22/2015 2346   HGBUR 3+ (A) 11/22/2015 2346   BILIRUBINUR NEGATIVE 11/22/2015 2346   BILIRUBINUR Negative 11/13/2015 1135   KETONESUR NEGATIVE 11/22/2015 2346   PROTEINUR 30 (A) 11/22/2015 2346   NITRITE NEGATIVE 11/22/2015 2346   LEUKOCYTESUR NEGATIVE 11/22/2015 2346   LEUKOCYTESUR Negative 11/13/2015 1135    Pertinent Imaging: ***  Assessment & Plan:  ***  There are no diagnoses linked to this encounter.  No Follow-up on file.  Hollice Espy, MD  [facility] [address] [place], [place] [address] [phone]

## 2015-12-24 DIAGNOSIS — H2513 Age-related nuclear cataract, bilateral: Secondary | ICD-10-CM | POA: Diagnosis not present

## 2016-01-15 ENCOUNTER — Ambulatory Visit (INDEPENDENT_AMBULATORY_CARE_PROVIDER_SITE_OTHER): Payer: Commercial Managed Care - HMO | Admitting: Urology

## 2016-01-15 ENCOUNTER — Ambulatory Visit
Admission: RE | Admit: 2016-01-15 | Discharge: 2016-01-15 | Disposition: A | Discharge status: Home / Self Care | Payer: Commercial Managed Care - HMO | Source: Ambulatory Visit | Attending: Urology | Admitting: Urology

## 2016-01-15 VITALS — BP 112/68 | HR 65 | Ht 68.0 in | Wt 179.0 lb

## 2016-01-15 DIAGNOSIS — I878 Other specified disorders of veins: Secondary | ICD-10-CM | POA: Insufficient documentation

## 2016-01-15 DIAGNOSIS — N200 Calculus of kidney: Secondary | ICD-10-CM | POA: Diagnosis not present

## 2016-01-15 DIAGNOSIS — N201 Calculus of ureter: Secondary | ICD-10-CM | POA: Diagnosis not present

## 2016-01-15 NOTE — Progress Notes (Signed)
01/15/2016 12:58 PM   Luisa Dago Mar 10, 1934 [id number]  Referring provider: Rusty Aus, MD [facility] [place], [place] [address]  Chief Complaint  Patient presents with  . Nephrolithiasis    2wk post litho w/KUB    HPI: 82 year old male with a 5-6 mm left proximal ureteral calculus who underwent ESWL on 12/03/2015. He returns today for follow-up. He was previously scheduled for 2 weeks postop however missed this appointment and rescheduled for today.  No flank pain since surgery.  He tolerated surgery well.  No urinary symptoms.  KUB shows no obvious ureteral stone although it was difficult to see stone on previous KUB.  He has had 4 kidney stone episodes in the past.  He has had open nephrolithotomy, basket extraction, and ESWL x 2 (including this episode).   He does report that he does drink a lot of water, 3-4x 16 oz water.   PMH: Past Medical History:  Diagnosis Date  . Myocardial infarction [facility])     Surgical History: Past Surgical History:  Procedure Laterality Date  . cardiac stents    . EXTRACORPOREAL SHOCK WAVE LITHOTRIPSY Left 12/03/2015   Procedure: EXTRACORPOREAL SHOCK WAVE LITHOTRIPSY (ESWL);  Surgeon: Hollice Espy, MD;  Location: [facility] ORS;  Service: Urology;  Laterality: Left;    Home Medications:    Medication List       Accurate as of 01/15/16 12:58 PM. Always use your most recent med list.          amLODipine 5 MG tablet Commonly known as:  NORVASC Take 5 mg by mouth at bedtime.   aspirin EC 81 MG tablet Take 81 mg by mouth daily.   atorvastatin 20 MG tablet Commonly known as:  LIPITOR Take 20 mg by mouth at bedtime.   clopidogrel 75 MG tablet Commonly known as:  PLAVIX Take 75 mg by mouth daily.   Denture Adhesive Crea   docusate sodium 100 MG capsule Commonly known as:  COLACE Take 1 capsule (100 mg total) by mouth 2 (two) times daily.   hydrochlorothiazide 12.5 MG  capsule Commonly known as:  MICROZIDE Take 1 capsule (12.5 mg total) by mouth daily.   nitroGLYCERIN 0.4 MG SL tablet Commonly known as:  NITROSTAT Place 0.4 mg under the tongue every 5 (five) minutes as needed for chest pain. Reported on 11/13/2015   omeprazole 20 MG capsule Commonly known as:  PRILOSEC Take 20 mg by mouth at bedtime.   Phenylephrine-Acetaminophen 5-325 MG Tabs   tamsulosin 0.4 MG Caps capsule Commonly known as:  FLOMAX Take 1 capsule (0.4 mg total) by mouth daily.   traZODone 50 MG tablet Commonly known as:  DESYREL   VITAMIN B COMPLEX PO       Allergies:  Allergies  Allergen Reactions  . Penicillins Rash and Other (See Comments)    Has patient had a PCN reaction causing immediate rash, facial/tongue/throat swelling, SOB or lightheadedness with hypotension: No Has patient had a PCN reaction causing severe rash involving mucus membranes or skin necrosis: No Has patient had a PCN reaction that required hospitalization No Has patient had a PCN reaction occurring within the last 10 years: No If all of the above answers are "NO", then may proceed with Cephalosporin use.    Family History: No family history on file.  Social History:  reports that he has never smoked. He uses smokeless tobacco. He reports that he does not drink alcohol. His drug history is not on  file.  ROS: UROLOGY Frequent Urination?: Yes Hard to postpone urination?: Yes Burning/pain with urination?: No Get up at night to urinate?: No Leakage of urine?: No Urine stream starts and stops?: No Trouble starting stream?: No Do you have to strain to urinate?: No Blood in urine?: No Urinary tract infection?: No Sexually transmitted disease?: No Injury to kidneys or bladder?: No Painful intercourse?: No Weak stream?: No Erection problems?: No Penile pain?: No  Gastrointestinal Nausea?: No Vomiting?: No Indigestion/heartburn?: No Diarrhea?: No Constipation?:  Yes  Constitutional Fever: No Night sweats?: No Weight loss?: No Fatigue?: Yes  Skin Skin rash/lesions?: No Itching?: No  Eyes Blurred vision?: No Double vision?: No  Ears/Nose/Throat Sore throat?: No Sinus problems?: No  Hematologic/Lymphatic Swollen glands?: No Easy bruising?: No  Cardiovascular Leg swelling?: No Chest pain?: No  Respiratory Cough?: No Shortness of breath?: No  Endocrine Excessive thirst?: No  Musculoskeletal Back pain?: No Joint pain?: No  Neurological Headaches?: No Dizziness?: No  Psychologic Depression?: No Anxiety?: No  Physical Exam: BP 112/68   Pulse 65   Ht 5\' 8"  (1.727 m)   Wt 179 lb (81.2 kg)   BMI 27.22 kg/m   Constitutional:  Alert and oriented, No acute distress. HEENT: [place] AT, moist mucus membranes.  Trachea midline, no masses. Cardiovascular: No clubbing, cyanosis, or edema. Respiratory: Normal respiratory effort, no increased work of breathing. GI: Abdomen is soft, nontender, nondistended, no abdominal masses GU: No CVA tenderness.  Skin: No rashes, bruises or suspicious lesions. Neurologic: Grossly intact, no focal deficits, moving all 4 extremities. Psychiatric: Normal mood and affect.  Laboratory Data: Lab Results  Component Value Date   WBC 9.6 11/22/2015   HGB 14.3 11/22/2015   HCT 42.0 11/22/2015   MCV 92.8 11/22/2015   PLT 173 11/22/2015    Lab Results  Component Value Date   CREATININE 1.33 (H) 11/22/2015    Pertinent Imaging: Study Result   CLINICAL DATA:  Left-sided nephrolithiasis.  EXAM: ABDOMEN - 1 VIEW  COMPARISON:  12/03/2015  FINDINGS: Probable tiny radiopaque stones overlying lower pole right kidney. Multiple tiny stones are seen overlying the left kidney. No definite ureteral stone on either side. Phleboliths overlie the anatomic pelvis.  IMPRESSION: Bilateral nephrolithiasis.   Electronically Signed   By: Misty Stanley M.D.   On: 01/15/2016 10:28    KUB  reviewed personally today with the patient.  Assessment & Plan:    1. Left ureteral stone S/p presumably successful ESWL given lack of symptoms  KUB today shows no ureteral stone, although stone was previously difficult to visualize on x-ray Recommend follow-up with renal ultrasound to ensure no evidence of residual hydronephrosis - will call with renal ultrasound results - US Renal; Future  2. Nephrolithiasis Multiple bilateral nonobstructing stones, small, asymptomatic  Will continue to follow with serial imaging  We discussed general stone prevention techniques including drinking plenty water with goal of producing 2.5 L urine daily, increased citric acid intake, avoidance of high oxalate containing foods, and decreased salt intake.  Information about dietary recommendations given today.    - DG Abd 1 View; Future  Return in about 1 year (around 01/14/2017) for KUB (will call with renal ultrasound results).  Hollice Espy, MD  [facility] [address] [place], [place] [address] [phone]

## 2016-01-15 NOTE — Patient Instructions (Signed)
Dietary Guidelines to Help Prevent Kidney Stones Your risk of kidney stones can be decreased by adjusting the foods you eat. The most important thing you can do is drink enough fluid. You should drink enough fluid to keep your urine clear or pale yellow. The following guidelines provide specific information for the type of kidney stone you have had. GUIDELINES ACCORDING TO TYPE OF KIDNEY STONE Calcium Oxalate Kidney Stones  Reduce the amount of salt you eat. Foods that have a lot of salt cause your body to release excess calcium into your urine. The excess calcium can combine with a substance called oxalate to form kidney stones.  Reduce the amount of animal protein you eat if the amount you eat is excessive. Animal protein causes your body to release excess calcium into your urine. Ask your dietitian how much protein from animal sources you should be eating.  Avoid foods that are high in oxalates. If you take vitamins, they should have less than 500 mg of vitamin C. Your body turns vitamin C into oxalates. You do not need to avoid fruits and vegetables high in vitamin C. Calcium Phosphate Kidney Stones  Reduce the amount of salt you eat to help prevent the release of excess calcium into your urine.  Reduce the amount of animal protein you eat if the amount you eat is excessive. Animal protein causes your body to release excess calcium into your urine. Ask your dietitian how much protein from animal sources you should be eating.  Get enough calcium from food or take a calcium supplement (ask your dietitian for recommendations). Food sources of calcium that do not increase your risk of kidney stones include:  Broccoli.  Dairy products, such as cheese and yogurt.  Pudding. Uric Acid Kidney Stones  Do not have more than 6 oz of animal protein per day. FOOD SOURCES Animal Protein Sources  Meat (all types).  Poultry.  Eggs.  Fish, seafood. Foods High in Salt  Salt seasonings.  Soy  sauce.  Teriyaki sauce.  Cured and processed meats.  Salted crackers and snack foods.  Fast food.  Canned soups and most canned foods. Foods High in Oxalates  Grains:  Amaranth.  Barley.  Grits.  Wheat germ.  Bran.  Buckwheat flour.  All bran cereals.  Pretzels.  Whole wheat bread.  Vegetables:  Beans (wax).  Beets and beet greens.  Collard greens.  Eggplant.  Escarole.  Leeks.  Okra.  Parsley.  Rutabagas.  Spinach.  Swiss chard.  Tomato paste.  Fried potatoes.  Sweet potatoes.  Fruits:  Red currants.  Figs.  Kiwi.  Rhubarb.  Meat and Other Protein Sources:  Beans (dried).  Soy burgers and other soybean products.  Miso.  Nuts (peanuts, almonds, pecans, cashews, hazelnuts).  Nut butters.  Sesame seeds and tahini (paste made of sesame seeds).  Poppy seeds.  Beverages:  Chocolate drink mixes.  Soy milk.  Instant iced tea.  Juices made from high-oxalate fruits or vegetables.  Other:  Carob.  Chocolate.  Fruitcake.  Marmalades.   This information is not intended to replace advice given to you by your health care provider. Make sure you discuss any questions you have with your health care provider.   Document Released: 08/13/2010 Document Revised: 04/23/2013 Document Reviewed: 03/15/2013 Elsevier Interactive Patient Education 2016 Elsevier Inc.  

## 2016-01-27 ENCOUNTER — Ambulatory Visit
Admission: RE | Admit: 2016-01-27 | Discharge: 2016-01-27 | Disposition: A | Discharge status: Home / Self Care | Payer: Commercial Managed Care - HMO | Source: Ambulatory Visit | Attending: Urology | Admitting: Urology

## 2016-01-27 DIAGNOSIS — N400 Enlarged prostate without lower urinary tract symptoms: Secondary | ICD-10-CM | POA: Insufficient documentation

## 2016-01-27 DIAGNOSIS — R93422 Abnormal radiologic findings on diagnostic imaging of left kidney: Secondary | ICD-10-CM | POA: Diagnosis not present

## 2016-01-27 DIAGNOSIS — R93421 Abnormal radiologic findings on diagnostic imaging of right kidney: Secondary | ICD-10-CM | POA: Diagnosis not present

## 2016-01-27 DIAGNOSIS — N201 Calculus of ureter: Secondary | ICD-10-CM | POA: Insufficient documentation

## 2016-01-29 ENCOUNTER — Telehealth: Payer: Self-pay

## 2016-01-29 NOTE — Telephone Encounter (Signed)
-----   Message from Hollice Espy, MD sent at 01/28/2016  8:13 AM EDT ----- Your renal ultrasound looks fine.  No swelling of your kidneys.  Good news.  Please let us know if you develop any flank pain.  Hollice Espy, MD

## 2016-01-29 NOTE — Telephone Encounter (Signed)
Spoke with pt in reference to RUS results. Pt voiced understanding.  

## 2016-02-25 DIAGNOSIS — R079 Chest pain, unspecified: Secondary | ICD-10-CM | POA: Diagnosis not present

## 2016-02-25 DIAGNOSIS — I10 Essential (primary) hypertension: Secondary | ICD-10-CM | POA: Diagnosis not present

## 2016-02-25 DIAGNOSIS — Z9889 Other specified postprocedural states: Secondary | ICD-10-CM | POA: Diagnosis not present

## 2016-02-25 DIAGNOSIS — E782 Mixed hyperlipidemia: Secondary | ICD-10-CM | POA: Diagnosis not present

## 2016-02-25 DIAGNOSIS — I2510 Atherosclerotic heart disease of native coronary artery without angina pectoris: Secondary | ICD-10-CM | POA: Diagnosis not present

## 2016-02-29 DIAGNOSIS — R079 Chest pain, unspecified: Secondary | ICD-10-CM | POA: Diagnosis not present

## 2016-02-29 DIAGNOSIS — I2510 Atherosclerotic heart disease of native coronary artery without angina pectoris: Secondary | ICD-10-CM | POA: Diagnosis not present

## 2016-03-07 DIAGNOSIS — Z9889 Other specified postprocedural states: Secondary | ICD-10-CM | POA: Diagnosis not present

## 2016-03-07 DIAGNOSIS — R079 Chest pain, unspecified: Secondary | ICD-10-CM | POA: Diagnosis not present

## 2016-03-07 DIAGNOSIS — I10 Essential (primary) hypertension: Secondary | ICD-10-CM | POA: Diagnosis not present

## 2016-03-16 DIAGNOSIS — I493 Ventricular premature depolarization: Secondary | ICD-10-CM | POA: Diagnosis not present

## 2016-03-16 DIAGNOSIS — R0789 Other chest pain: Secondary | ICD-10-CM | POA: Diagnosis not present

## 2016-03-22 DIAGNOSIS — R0789 Other chest pain: Secondary | ICD-10-CM | POA: Diagnosis not present

## 2016-04-05 DIAGNOSIS — R0789 Other chest pain: Secondary | ICD-10-CM | POA: Diagnosis not present

## 2016-06-01 DIAGNOSIS — I2510 Atherosclerotic heart disease of native coronary artery without angina pectoris: Secondary | ICD-10-CM | POA: Diagnosis not present

## 2016-06-01 DIAGNOSIS — R7309 Other abnormal glucose: Secondary | ICD-10-CM | POA: Diagnosis not present

## 2016-06-01 DIAGNOSIS — Z125 Encounter for screening for malignant neoplasm of prostate: Secondary | ICD-10-CM | POA: Diagnosis not present

## 2016-06-01 DIAGNOSIS — E782 Mixed hyperlipidemia: Secondary | ICD-10-CM | POA: Diagnosis not present

## 2016-06-08 DIAGNOSIS — Z0000 Encounter for general adult medical examination without abnormal findings: Secondary | ICD-10-CM | POA: Insufficient documentation

## 2016-06-08 DIAGNOSIS — R972 Elevated prostate specific antigen [PSA]: Secondary | ICD-10-CM | POA: Diagnosis not present

## 2016-09-07 DIAGNOSIS — I10 Essential (primary) hypertension: Secondary | ICD-10-CM | POA: Diagnosis not present

## 2016-09-07 DIAGNOSIS — Z9889 Other specified postprocedural states: Secondary | ICD-10-CM | POA: Diagnosis not present

## 2016-09-07 DIAGNOSIS — I2510 Atherosclerotic heart disease of native coronary artery without angina pectoris: Secondary | ICD-10-CM | POA: Diagnosis not present

## 2016-09-07 DIAGNOSIS — E782 Mixed hyperlipidemia: Secondary | ICD-10-CM | POA: Diagnosis not present

## 2016-10-28 DIAGNOSIS — H2511 Age-related nuclear cataract, right eye: Secondary | ICD-10-CM | POA: Diagnosis not present

## 2016-11-30 DIAGNOSIS — E782 Mixed hyperlipidemia: Secondary | ICD-10-CM | POA: Diagnosis not present

## 2016-11-30 DIAGNOSIS — R972 Elevated prostate specific antigen [PSA]: Secondary | ICD-10-CM | POA: Diagnosis not present

## 2016-11-30 DIAGNOSIS — Z0000 Encounter for general adult medical examination without abnormal findings: Secondary | ICD-10-CM | POA: Diagnosis not present

## 2016-11-30 DIAGNOSIS — I2510 Atherosclerotic heart disease of native coronary artery without angina pectoris: Secondary | ICD-10-CM | POA: Diagnosis not present

## 2016-12-07 DIAGNOSIS — Z125 Encounter for screening for malignant neoplasm of prostate: Secondary | ICD-10-CM | POA: Diagnosis not present

## 2016-12-07 DIAGNOSIS — Z79899 Other long term (current) drug therapy: Secondary | ICD-10-CM | POA: Diagnosis not present

## 2016-12-07 DIAGNOSIS — I2510 Atherosclerotic heart disease of native coronary artery without angina pectoris: Secondary | ICD-10-CM | POA: Diagnosis not present

## 2016-12-07 DIAGNOSIS — E782 Mixed hyperlipidemia: Secondary | ICD-10-CM | POA: Diagnosis not present

## 2017-01-11 ENCOUNTER — Encounter: Payer: Self-pay | Admitting: Urology

## 2017-01-11 ENCOUNTER — Ambulatory Visit: Payer: Commercial Managed Care - HMO | Admitting: Urology

## 2017-01-24 DIAGNOSIS — H6122 Impacted cerumen, left ear: Secondary | ICD-10-CM | POA: Diagnosis not present

## 2017-01-24 DIAGNOSIS — L989 Disorder of the skin and subcutaneous tissue, unspecified: Secondary | ICD-10-CM | POA: Diagnosis not present

## 2017-01-24 DIAGNOSIS — J309 Allergic rhinitis, unspecified: Secondary | ICD-10-CM | POA: Diagnosis not present

## 2017-02-01 DIAGNOSIS — Z08 Encounter for follow-up examination after completed treatment for malignant neoplasm: Secondary | ICD-10-CM | POA: Diagnosis not present

## 2017-02-01 DIAGNOSIS — L570 Actinic keratosis: Secondary | ICD-10-CM | POA: Diagnosis not present

## 2017-02-01 DIAGNOSIS — L820 Inflamed seborrheic keratosis: Secondary | ICD-10-CM | POA: Diagnosis not present

## 2017-02-01 DIAGNOSIS — D485 Neoplasm of uncertain behavior of skin: Secondary | ICD-10-CM | POA: Diagnosis not present

## 2017-02-01 DIAGNOSIS — L298 Other pruritus: Secondary | ICD-10-CM | POA: Diagnosis not present

## 2017-02-01 DIAGNOSIS — C44629 Squamous cell carcinoma of skin of left upper limb, including shoulder: Secondary | ICD-10-CM | POA: Diagnosis not present

## 2017-02-01 DIAGNOSIS — X32XXXA Exposure to sunlight, initial encounter: Secondary | ICD-10-CM | POA: Diagnosis not present

## 2017-02-01 DIAGNOSIS — L538 Other specified erythematous conditions: Secondary | ICD-10-CM | POA: Diagnosis not present

## 2017-02-01 DIAGNOSIS — Z85828 Personal history of other malignant neoplasm of skin: Secondary | ICD-10-CM | POA: Diagnosis not present

## 2017-02-08 DIAGNOSIS — D0462 Carcinoma in situ of skin of left upper limb, including shoulder: Secondary | ICD-10-CM | POA: Diagnosis not present

## 2017-02-08 DIAGNOSIS — C44629 Squamous cell carcinoma of skin of left upper limb, including shoulder: Secondary | ICD-10-CM | POA: Diagnosis not present

## 2017-02-14 DIAGNOSIS — H6123 Impacted cerumen, bilateral: Secondary | ICD-10-CM | POA: Diagnosis not present

## 2017-02-14 DIAGNOSIS — H60332 Swimmer's ear, left ear: Secondary | ICD-10-CM | POA: Diagnosis not present

## 2017-02-16 DIAGNOSIS — H60332 Swimmer's ear, left ear: Secondary | ICD-10-CM | POA: Diagnosis not present

## 2017-03-13 DIAGNOSIS — E782 Mixed hyperlipidemia: Secondary | ICD-10-CM | POA: Diagnosis not present

## 2017-03-13 DIAGNOSIS — I10 Essential (primary) hypertension: Secondary | ICD-10-CM | POA: Diagnosis not present

## 2017-03-13 DIAGNOSIS — Z9889 Other specified postprocedural states: Secondary | ICD-10-CM | POA: Diagnosis not present

## 2017-05-12 DIAGNOSIS — D0439 Carcinoma in situ of skin of other parts of face: Secondary | ICD-10-CM | POA: Diagnosis not present

## 2017-05-12 DIAGNOSIS — C44319 Basal cell carcinoma of skin of other parts of face: Secondary | ICD-10-CM | POA: Diagnosis not present

## 2017-05-12 DIAGNOSIS — X32XXXA Exposure to sunlight, initial encounter: Secondary | ICD-10-CM | POA: Diagnosis not present

## 2017-05-12 DIAGNOSIS — Z4802 Encounter for removal of sutures: Secondary | ICD-10-CM | POA: Diagnosis not present

## 2017-05-12 DIAGNOSIS — Z08 Encounter for follow-up examination after completed treatment for malignant neoplasm: Secondary | ICD-10-CM | POA: Diagnosis not present

## 2017-05-12 DIAGNOSIS — Z85828 Personal history of other malignant neoplasm of skin: Secondary | ICD-10-CM | POA: Diagnosis not present

## 2017-05-12 DIAGNOSIS — D485 Neoplasm of uncertain behavior of skin: Secondary | ICD-10-CM | POA: Diagnosis not present

## 2017-05-12 DIAGNOSIS — L570 Actinic keratosis: Secondary | ICD-10-CM | POA: Diagnosis not present

## 2017-06-08 DIAGNOSIS — Z125 Encounter for screening for malignant neoplasm of prostate: Secondary | ICD-10-CM | POA: Diagnosis not present

## 2017-06-08 DIAGNOSIS — Z79899 Other long term (current) drug therapy: Secondary | ICD-10-CM | POA: Diagnosis not present

## 2017-06-08 DIAGNOSIS — E782 Mixed hyperlipidemia: Secondary | ICD-10-CM | POA: Diagnosis not present

## 2017-06-15 DIAGNOSIS — Z0000 Encounter for general adult medical examination without abnormal findings: Secondary | ICD-10-CM | POA: Diagnosis not present

## 2017-06-15 DIAGNOSIS — R972 Elevated prostate specific antigen [PSA]: Secondary | ICD-10-CM | POA: Diagnosis not present

## 2017-06-15 DIAGNOSIS — E782 Mixed hyperlipidemia: Secondary | ICD-10-CM | POA: Diagnosis not present

## 2017-06-21 DIAGNOSIS — C44319 Basal cell carcinoma of skin of other parts of face: Secondary | ICD-10-CM | POA: Diagnosis not present

## 2017-06-28 DIAGNOSIS — D0439 Carcinoma in situ of skin of other parts of face: Secondary | ICD-10-CM | POA: Diagnosis not present

## 2017-09-13 DIAGNOSIS — I10 Essential (primary) hypertension: Secondary | ICD-10-CM | POA: Diagnosis not present

## 2017-09-13 DIAGNOSIS — E782 Mixed hyperlipidemia: Secondary | ICD-10-CM | POA: Diagnosis not present

## 2017-09-13 DIAGNOSIS — Z9889 Other specified postprocedural states: Secondary | ICD-10-CM | POA: Diagnosis not present

## 2017-10-06 DIAGNOSIS — H2513 Age-related nuclear cataract, bilateral: Secondary | ICD-10-CM | POA: Diagnosis not present

## 2017-10-13 DIAGNOSIS — H2512 Age-related nuclear cataract, left eye: Secondary | ICD-10-CM | POA: Diagnosis not present

## 2017-10-17 ENCOUNTER — Encounter: Payer: Self-pay | Admitting: *Deleted

## 2017-10-24 ENCOUNTER — Encounter
Admission: RE | Disposition: A | Discharge status: Home / Self Care | Payer: Self-pay | Source: Ambulatory Visit | Attending: Ophthalmology

## 2017-10-24 ENCOUNTER — Ambulatory Visit: Payer: Medicare HMO | Admitting: Certified Registered"

## 2017-10-24 ENCOUNTER — Other Ambulatory Visit: Payer: Self-pay

## 2017-10-24 ENCOUNTER — Ambulatory Visit
Admission: RE | Admit: 2017-10-24 | Discharge: 2017-10-24 | Disposition: A | Discharge status: Home / Self Care | Payer: Medicare HMO | Source: Ambulatory Visit | Attending: Ophthalmology | Admitting: Ophthalmology

## 2017-10-24 DIAGNOSIS — K219 Gastro-esophageal reflux disease without esophagitis: Secondary | ICD-10-CM | POA: Diagnosis not present

## 2017-10-24 DIAGNOSIS — Z85828 Personal history of other malignant neoplasm of skin: Secondary | ICD-10-CM | POA: Insufficient documentation

## 2017-10-24 DIAGNOSIS — Z880 Allergy status to penicillin: Secondary | ICD-10-CM | POA: Insufficient documentation

## 2017-10-24 DIAGNOSIS — E7800 Pure hypercholesterolemia, unspecified: Secondary | ICD-10-CM | POA: Insufficient documentation

## 2017-10-24 DIAGNOSIS — Z87442 Personal history of urinary calculi: Secondary | ICD-10-CM | POA: Diagnosis not present

## 2017-10-24 DIAGNOSIS — I2510 Atherosclerotic heart disease of native coronary artery without angina pectoris: Secondary | ICD-10-CM | POA: Diagnosis not present

## 2017-10-24 DIAGNOSIS — J45909 Unspecified asthma, uncomplicated: Secondary | ICD-10-CM | POA: Diagnosis not present

## 2017-10-24 DIAGNOSIS — I10 Essential (primary) hypertension: Secondary | ICD-10-CM | POA: Diagnosis not present

## 2017-10-24 DIAGNOSIS — F17220 Nicotine dependence, chewing tobacco, uncomplicated: Secondary | ICD-10-CM | POA: Insufficient documentation

## 2017-10-24 DIAGNOSIS — F329 Major depressive disorder, single episode, unspecified: Secondary | ICD-10-CM | POA: Diagnosis not present

## 2017-10-24 DIAGNOSIS — I252 Old myocardial infarction: Secondary | ICD-10-CM | POA: Diagnosis not present

## 2017-10-24 DIAGNOSIS — H2512 Age-related nuclear cataract, left eye: Secondary | ICD-10-CM | POA: Insufficient documentation

## 2017-10-24 DIAGNOSIS — Z955 Presence of coronary angioplasty implant and graft: Secondary | ICD-10-CM | POA: Diagnosis not present

## 2017-10-24 HISTORY — DX: Malignant (primary) neoplasm, unspecified: C80.1

## 2017-10-24 HISTORY — DX: Major depressive disorder, single episode, unspecified: F32.9

## 2017-10-24 HISTORY — DX: Essential (primary) hypertension: I10

## 2017-10-24 HISTORY — PX: CATARACT EXTRACTION W/PHACO: SHX586

## 2017-10-24 HISTORY — DX: Depression, unspecified: F32.A

## 2017-10-24 HISTORY — DX: Atherosclerotic heart disease of native coronary artery without angina pectoris: I25.10

## 2017-10-24 HISTORY — DX: Personal history of urinary calculi: Z87.442

## 2017-10-24 SURGERY — PHACOEMULSIFICATION, CATARACT, WITH IOL INSERTION
Anesthesia: Monitor Anesthesia Care | Site: Eye | Laterality: Left | Wound class: "Clean "

## 2017-10-24 MED ORDER — ARMC OPHTHALMIC DILATING DROPS
1.0000 | OPHTHALMIC | Status: AC
Start: 2017-10-24 — End: 2017-10-24
  Administered 2017-10-24 (×2): 1 via OPHTHALMIC

## 2017-10-24 MED ORDER — EPINEPHRINE PF 1 MG/ML IJ SOLN
INTRAOCULAR | Status: DC | PRN
  Administered 2017-10-24: 11:00:00 via OPHTHALMIC

## 2017-10-24 MED ORDER — LIDOCAINE HCL (PF) 4 % IJ SOLN
INTRAOCULAR | Status: DC | PRN
  Administered 2017-10-24: 4 mL via OPHTHALMIC

## 2017-10-24 MED ORDER — LIDOCAINE HCL (PF) 4 % IJ SOLN
INTRAMUSCULAR | Status: AC
  Filled 2017-10-24: qty 5

## 2017-10-24 MED ORDER — CARBACHOL 0.01 % IO SOLN
INTRAOCULAR | Status: DC | PRN
Start: 2017-10-24 — End: 2017-10-24
  Administered 2017-10-24: 0.5 mL via INTRAOCULAR

## 2017-10-24 MED ORDER — MOXIFLOXACIN HCL 0.5 % OP SOLN: 1.0000 [drp] | OPHTHALMIC | Status: DC | PRN

## 2017-10-24 MED ORDER — MOXIFLOXACIN HCL 0.5 % OP SOLN
OPHTHALMIC | Status: DC | PRN
  Administered 2017-10-24: 0.2 mL via OPHTHALMIC

## 2017-10-24 MED ORDER — POVIDONE-IODINE 5 % OP SOLN
OPHTHALMIC | Status: DC | PRN
  Administered 2017-10-24: 1 via OPHTHALMIC

## 2017-10-24 MED ORDER — ARMC OPHTHALMIC DILATING DROPS
OPHTHALMIC | Status: AC
  Administered 2017-10-24 (×2): 1 via OPHTHALMIC
  Administered 2017-10-24: 1
  Filled 2017-10-24: qty 0.4

## 2017-10-24 MED ORDER — POVIDONE-IODINE 5 % OP SOLN
OPHTHALMIC | Status: AC
  Filled 2017-10-24: qty 30

## 2017-10-24 MED ORDER — NA CHONDROIT SULF-NA HYALURON 40-17 MG/ML IO SOLN
INTRAOCULAR | Status: AC
  Filled 2017-10-24: qty 1

## 2017-10-24 MED ORDER — SODIUM CHLORIDE 0.9 % IV SOLN
INTRAVENOUS | Status: DC
  Administered 2017-10-24: 10:00:00 via INTRAVENOUS

## 2017-10-24 MED ORDER — MIDAZOLAM HCL 2 MG/2ML IJ SOLN
INTRAMUSCULAR | Status: AC
Start: 2017-10-24 — End: ?
  Filled 2017-10-24: qty 2

## 2017-10-24 MED ORDER — EPINEPHRINE PF 1 MG/ML IJ SOLN
INTRAMUSCULAR | Status: AC
  Filled 2017-10-24: qty 2

## 2017-10-24 MED ORDER — MOXIFLOXACIN HCL 0.5 % OP SOLN
OPHTHALMIC | Status: AC
  Filled 2017-10-24: qty 3

## 2017-10-24 MED ORDER — NA CHONDROIT SULF-NA HYALURON 40-17 MG/ML IO SOLN
INTRAOCULAR | Status: DC | PRN
  Administered 2017-10-24: 1 mL via INTRAOCULAR

## 2017-10-24 MED ORDER — MIDAZOLAM HCL 2 MG/2ML IJ SOLN
INTRAMUSCULAR | Status: DC | PRN
  Administered 2017-10-24: 1 mg via INTRAVENOUS

## 2017-10-24 SURGICAL SUPPLY — 16 items
GLOVE BIO SURGEON STRL SZ8 (GLOVE) ×3 IMPLANT
GLOVE BIOGEL M 6.5 STRL (GLOVE) ×3 IMPLANT
GLOVE SURG LX 8.0 MICRO (GLOVE) ×2
GLOVE SURG LX STRL 8.0 MICRO (GLOVE) ×1 IMPLANT
GOWN STRL REUS W/ TWL LRG LVL3 (GOWN DISPOSABLE) ×2 IMPLANT
GOWN STRL REUS W/TWL LRG LVL3 (GOWN DISPOSABLE) ×4
LABEL CATARACT MEDS ST (LABEL) ×3 IMPLANT
LENS IOL TECNIS ITEC 22.0 (Intraocular Lens) ×2 IMPLANT
PACK CATARACT (MISCELLANEOUS) ×3 IMPLANT
PACK CATARACT BRASINGTON LX (MISCELLANEOUS) ×3 IMPLANT
PACK EYE AFTER SURG (MISCELLANEOUS) ×3 IMPLANT
SOL BSS BAG (MISCELLANEOUS) ×3
SOLUTION BSS BAG (MISCELLANEOUS) ×1 IMPLANT
SYR 5ML LL (SYRINGE) ×3 IMPLANT
WATER STERILE IRR 250ML POUR (IV SOLUTION) ×3 IMPLANT
WIPE NON LINTING 3.25X3.25 (MISCELLANEOUS) ×3 IMPLANT

## 2017-10-24 NOTE — Anesthesia Procedure Notes (Signed)
Procedure Name: MAC Performed by: Lance Muss, CRNA Pre-anesthesia Checklist: Patient identified, Emergency Drugs available, Suction available, Patient being monitored and Timeout performed Oxygen Delivery Method: Nasal cannula

## 2017-10-24 NOTE — Discharge Instructions (Signed)

## 2017-10-24 NOTE — Anesthesia Preprocedure Evaluation (Addendum)
Anesthesia Evaluation  Patient identified by MRN, date of birth, ID band Patient awake    Reviewed: Allergy & Precautions, H&P , NPO status , reviewed documented beta blocker date and time   Airway Mallampati: II  TM Distance: >3 FB Neck ROM: full    Dental  (+) Upper Dentures, Lower Dentures   Pulmonary    Pulmonary exam normal        Cardiovascular hypertension, + CAD and + Past MI  Normal cardiovascular exam     Neuro/Psych PSYCHIATRIC DISORDERS Depression    GI/Hepatic   Endo/Other    Renal/GU Renal disease     Musculoskeletal   Abdominal   Peds  Hematology   Anesthesia Other Findings Past Medical History: No date: Cancer ([facility])     Comment:  SKIN No date: Coronary artery disease No date: Depression No date: History of kidney stones No date: Hypertension No date: Myocardial infarction [facility])  Past Surgical History: No date: cardiac stents No date: CORONARY ANGIOPLASTY     Comment:  STENT 12/03/2015: EXTRACORPOREAL SHOCK WAVE LITHOTRIPSY; Left     Comment:  Procedure: EXTRACORPOREAL SHOCK WAVE LITHOTRIPSY (ESWL);              Surgeon: Hollice Espy, MD;  Location: [facility] ORS;                Service: Urology;  Laterality: Left;  BMI    Body Mass Index:  27.41 kg/m      Reproductive/Obstetrics                             Anesthesia Physical Anesthesia Plan  ASA: III  Anesthesia Plan: MAC   Post-op Pain Management:    Induction:   PONV Risk Score and Plan: 1 and Treatment may vary due to age or medical condition and TIVA  Airway Management Planned:   Additional Equipment:   Intra-op Plan:   Post-operative Plan:   Informed Consent: I have reviewed the patients History and Physical, chart, labs and discussed the procedure including the risks, benefits and alternatives for the proposed anesthesia with the patient or authorized representative who has indicated his/her  understanding and acceptance.   Dental Advisory Given  Plan Discussed with: CRNA  Anesthesia Plan Comments:         Anesthesia Quick Evaluation

## 2017-10-24 NOTE — Anesthesia Post-op Follow-up Note (Signed)
Anesthesia QCDR form completed.        

## 2017-10-24 NOTE — Op Note (Signed)
PREOPERATIVE DIAGNOSIS:  Nuclear sclerotic cataract of the left eye.   POSTOPERATIVE DIAGNOSIS:  Nuclear sclerotic cataract of the left eye.   OPERATIVE PROCEDURE: Procedure(s): CATARACT EXTRACTION PHACO AND INTRAOCULAR LENS PLACEMENT (IOC)   SURGEON:  Birder Robson, MD.   ANESTHESIA:  Anesthesiologist: Alphonsus Sias, MD CRNA: Lance Muss, CRNA  1.      Managed anesthesia care. 2.     0.73ml of Shugarcaine was instilled following the paracentesis   COMPLICATIONS:  None.   TECHNIQUE:   Stop and chop   DESCRIPTION OF PROCEDURE:  The patient was examined and consented in the preoperative holding area where the aforementioned topical anesthesia was applied to the left eye and then brought back to the Operating Room where the left eye was prepped and draped in the usual sterile ophthalmic fashion and a lid speculum was placed. A paracentesis was created with the side port blade and the anterior chamber was filled with viscoelastic. A near clear corneal incision was performed with the steel keratome. A continuous curvilinear capsulorrhexis was performed with a cystotome followed by the capsulorrhexis forceps. Hydrodissection and hydrodelineation were carried out with BSS on a blunt cannula. The lens was removed in a stop and chop  technique and the remaining cortical material was removed with the irrigation-aspiration handpiece. The capsular bag was inflated with viscoelastic and the Technis [id number] lens was placed in the capsular bag without complication. The remaining viscoelastic was removed from the eye with the irrigation-aspiration handpiece. The wounds were hydrated. The anterior chamber was flushed with Miostat and the eye was inflated to physiologic pressure. 0.19ml Vigamox was placed in the anterior chamber. The wounds were found to be water tight. The eye was dressed with Vigamox. The patient was given protective glasses to wear throughout the day and a shield with which to sleep tonight.  The patient was also given drops with which to begin a drop regimen today and will follow-up with me in one day. Implant Name Type Inv. Item Serial No. Manufacturer Lot No. LRB No. Used  LENS IOL DIOP 22.0 - [id number] Intraocular Lens LENS IOL DIOP 22.0 [id number] AMO  Left 1    Procedure(s) with comments: CATARACT EXTRACTION PHACO AND INTRAOCULAR LENS PLACEMENT (IOC) (Left) - [place] 00:34.3 AP% 15.8 CDE 5.41 Fluid Pack lot # [id number]  Electronically signed: Birder Robson 10/24/2017 11:07 AM

## 2017-10-24 NOTE — Transfer of Care (Signed)
Immediate Anesthesia Transfer of Care Note  Patient: Patrick Wilson  Procedure(s) Performed: CATARACT EXTRACTION PHACO AND INTRAOCULAR LENS PLACEMENT (IOC) (Left Eye)  Patient Location: PACU  Anesthesia Type:MAC  Level of Consciousness: awake, alert  and oriented  Airway & Oxygen Therapy: Patient Spontanous Breathing  Post-op Assessment: Report given to RN and Post -op Vital signs reviewed and stable  Post vital signs: Reviewed and stable  Last Vitals:  Vitals Value Taken Time  BP 108/61 10/24/2017 11:08 AM  Temp 36.3 C 10/24/2017 11:08 AM  Pulse 55 10/24/2017 11:08 AM  Resp 11 10/24/2017 11:08 AM  SpO2 98 % 10/24/2017 11:08 AM    Last Pain:  Vitals:   10/24/17 1108  TempSrc: Temporal  PainSc: 0-No pain         Complications: No apparent anesthesia complications

## 2017-10-24 NOTE — Anesthesia Postprocedure Evaluation (Signed)
Anesthesia Post Note  Patient: Patrick Wilson  Procedure(s) Performed: CATARACT EXTRACTION PHACO AND INTRAOCULAR LENS PLACEMENT (IOC) (Left Eye)  Patient location during evaluation: PACU Anesthesia Type: MAC Level of consciousness: awake and awake and alert Pain management: pain level controlled Vital Signs Assessment: post-procedure vital signs reviewed and stable Respiratory status: spontaneous breathing, nonlabored ventilation and respiratory function stable Cardiovascular status: stable Anesthetic complications: no     Last Vitals:  Vitals:   10/24/17 0957 10/24/17 1108  BP: (!) 115/58 108/61  Pulse: 61 (!) 55  Resp: 18 11  Temp: (!) 36.1 C (!) 36.3 C  SpO2: 97% 98%    Last Pain:  Vitals:   10/24/17 1108  TempSrc: Temporal  PainSc: 0-No pain                 [employer]

## 2017-10-24 NOTE — H&P (Signed)
All labs reviewed. Abnormal studies sent to patients PCP when indicated.  Previous H&P reviewed, patient examined, there are NO CHANGES.  Patrick Wilson Porfilio6/25/201910:42 AM

## 2017-10-25 ENCOUNTER — Encounter: Payer: Self-pay | Admitting: Ophthalmology

## 2017-11-08 DIAGNOSIS — H60339 Swimmer's ear, unspecified ear: Secondary | ICD-10-CM | POA: Diagnosis not present

## 2017-11-08 DIAGNOSIS — H6123 Impacted cerumen, bilateral: Secondary | ICD-10-CM | POA: Diagnosis not present

## 2017-11-17 DIAGNOSIS — H2511 Age-related nuclear cataract, right eye: Secondary | ICD-10-CM | POA: Diagnosis not present

## 2017-11-20 ENCOUNTER — Encounter: Payer: Self-pay | Admitting: *Deleted

## 2017-11-28 ENCOUNTER — Ambulatory Visit: Payer: Medicare HMO | Admitting: Anesthesiology

## 2017-11-28 ENCOUNTER — Other Ambulatory Visit: Payer: Self-pay

## 2017-11-28 ENCOUNTER — Ambulatory Visit
Admission: RE | Admit: 2017-11-28 | Discharge: 2017-11-28 | Disposition: A | Discharge status: Home / Self Care | Payer: Medicare HMO | Source: Ambulatory Visit | Attending: Ophthalmology | Admitting: Ophthalmology

## 2017-11-28 ENCOUNTER — Encounter
Admission: RE | Disposition: A | Discharge status: Home / Self Care | Payer: Self-pay | Source: Ambulatory Visit | Attending: Ophthalmology

## 2017-11-28 ENCOUNTER — Encounter: Payer: Self-pay | Admitting: *Deleted

## 2017-11-28 DIAGNOSIS — Z79899 Other long term (current) drug therapy: Secondary | ICD-10-CM | POA: Diagnosis not present

## 2017-11-28 DIAGNOSIS — H2511 Age-related nuclear cataract, right eye: Secondary | ICD-10-CM | POA: Insufficient documentation

## 2017-11-28 DIAGNOSIS — I110 Hypertensive heart disease with heart failure: Secondary | ICD-10-CM | POA: Diagnosis not present

## 2017-11-28 DIAGNOSIS — I252 Old myocardial infarction: Secondary | ICD-10-CM | POA: Insufficient documentation

## 2017-11-28 DIAGNOSIS — I10 Essential (primary) hypertension: Secondary | ICD-10-CM | POA: Insufficient documentation

## 2017-11-28 DIAGNOSIS — I509 Heart failure, unspecified: Secondary | ICD-10-CM | POA: Diagnosis not present

## 2017-11-28 DIAGNOSIS — Z85828 Personal history of other malignant neoplasm of skin: Secondary | ICD-10-CM | POA: Diagnosis not present

## 2017-11-28 DIAGNOSIS — I2510 Atherosclerotic heart disease of native coronary artery without angina pectoris: Secondary | ICD-10-CM | POA: Insufficient documentation

## 2017-11-28 DIAGNOSIS — Z955 Presence of coronary angioplasty implant and graft: Secondary | ICD-10-CM | POA: Insufficient documentation

## 2017-11-28 DIAGNOSIS — F329 Major depressive disorder, single episode, unspecified: Secondary | ICD-10-CM | POA: Diagnosis not present

## 2017-11-28 DIAGNOSIS — F17220 Nicotine dependence, chewing tobacco, uncomplicated: Secondary | ICD-10-CM | POA: Diagnosis not present

## 2017-11-28 DIAGNOSIS — K219 Gastro-esophageal reflux disease without esophagitis: Secondary | ICD-10-CM | POA: Insufficient documentation

## 2017-11-28 HISTORY — PX: CATARACT EXTRACTION W/PHACO: SHX586

## 2017-11-28 HISTORY — DX: Gastro-esophageal reflux disease without esophagitis: K21.9

## 2017-11-28 SURGERY — PHACOEMULSIFICATION, CATARACT, WITH IOL INSERTION
Anesthesia: Monitor Anesthesia Care | Site: Eye | Laterality: Right | Wound class: "Clean "

## 2017-11-28 MED ORDER — NA CHONDROIT SULF-NA HYALURON 40-17 MG/ML IO SOLN
INTRAOCULAR | Status: AC
  Filled 2017-11-28: qty 1

## 2017-11-28 MED ORDER — POVIDONE-IODINE 5 % OP SOLN
OPHTHALMIC | Status: DC | PRN
  Administered 2017-11-28: 1 via OPHTHALMIC

## 2017-11-28 MED ORDER — MOXIFLOXACIN HCL 0.5 % OP SOLN
OPHTHALMIC | Status: DC | PRN
  Administered 2017-11-28: .2 mL via OPHTHALMIC

## 2017-11-28 MED ORDER — CARBACHOL 0.01 % IO SOLN
INTRAOCULAR | Status: DC | PRN
  Administered 2017-11-28: .5 mL via INTRAOCULAR

## 2017-11-28 MED ORDER — PHENYLEPHRINE HCL 10 % OP SOLN
1.0000 [drp] | OPHTHALMIC | Status: AC
  Administered 2017-11-28 (×4): 1 [drp] via OPHTHALMIC

## 2017-11-28 MED ORDER — CYCLOPENTOLATE HCL 2 % OP SOLN
1.0000 [drp] | OPHTHALMIC | Status: AC
  Administered 2017-11-28 (×4): 1 [drp] via OPHTHALMIC

## 2017-11-28 MED ORDER — MIDAZOLAM HCL 2 MG/2ML IJ SOLN
INTRAMUSCULAR | Status: DC | PRN
  Administered 2017-11-28: 0.5 mg via INTRAVENOUS

## 2017-11-28 MED ORDER — LIDOCAINE HCL (PF) 4 % IJ SOLN
INTRAOCULAR | Status: DC | PRN
  Administered 2017-11-28: 2 mL via OPHTHALMIC

## 2017-11-28 MED ORDER — LIDOCAINE HCL (PF) 4 % IJ SOLN
INTRAMUSCULAR | Status: AC
  Filled 2017-11-28: qty 5

## 2017-11-28 MED ORDER — FENTANYL CITRATE (PF) 100 MCG/2ML IJ SOLN
INTRAMUSCULAR | Status: DC | PRN
  Administered 2017-11-28: 25 ug via INTRAVENOUS

## 2017-11-28 MED ORDER — MOXIFLOXACIN HCL 0.5 % OP SOLN: 1.0000 [drp] | OPHTHALMIC | Status: DC | PRN

## 2017-11-28 MED ORDER — EPINEPHRINE PF 1 MG/ML IJ SOLN
INTRAMUSCULAR | Status: AC
  Filled 2017-11-28: qty 1

## 2017-11-28 MED ORDER — CYCLOPENTOLATE HCL 2 % OP SOLN
OPHTHALMIC | Status: AC
  Administered 2017-11-28: 1 [drp] via OPHTHALMIC
  Filled 2017-11-28: qty 2

## 2017-11-28 MED ORDER — MOXIFLOXACIN HCL 0.5 % OP SOLN
OPHTHALMIC | Status: AC
  Filled 2017-11-28: qty 3

## 2017-11-28 MED ORDER — TETRACAINE HCL 0.5 % OP SOLN
OPHTHALMIC | Status: AC
  Administered 2017-11-28: 09:00:00
  Filled 2017-11-28: qty 4

## 2017-11-28 MED ORDER — NA CHONDROIT SULF-NA HYALURON 40-17 MG/ML IO SOLN
INTRAOCULAR | Status: DC | PRN
  Administered 2017-11-28: 1 mL via INTRAOCULAR

## 2017-11-28 MED ORDER — PHENYLEPHRINE HCL 10 % OP SOLN
OPHTHALMIC | Status: AC
  Administered 2017-11-28: 1 [drp] via OPHTHALMIC
  Filled 2017-11-28: qty 5

## 2017-11-28 MED ORDER — TETRACAINE HCL 0.5 % OP SOLN
1.0000 [drp] | Freq: Once | OPHTHALMIC | Status: AC
  Administered 2017-11-28: 1 [drp] via OPHTHALMIC

## 2017-11-28 MED ORDER — ONDANSETRON HCL 4 MG/2ML IJ SOLN: 4.0000 mg | Freq: Once | INTRAMUSCULAR | Status: DC | PRN

## 2017-11-28 MED ORDER — POVIDONE-IODINE 5 % OP SOLN
OPHTHALMIC | Status: AC
  Filled 2017-11-28: qty 30

## 2017-11-28 MED ORDER — SODIUM CHLORIDE 0.9 % IV SOLN
INTRAVENOUS | Status: DC
  Administered 2017-11-28: 08:00:00 via INTRAVENOUS

## 2017-11-28 MED ORDER — EPINEPHRINE PF 1 MG/ML IJ SOLN
INTRAOCULAR | Status: DC | PRN
  Administered 2017-11-28: 1 mL via OPHTHALMIC

## 2017-11-28 SURGICAL SUPPLY — 16 items
GLOVE BIO SURGEON STRL SZ8 (GLOVE) ×3 IMPLANT
GLOVE BIOGEL M 6.5 STRL (GLOVE) ×3 IMPLANT
GLOVE SURG LX 8.0 MICRO (GLOVE) ×2
GLOVE SURG LX STRL 8.0 MICRO (GLOVE) ×1 IMPLANT
GOWN STRL REUS W/ TWL LRG LVL3 (GOWN DISPOSABLE) ×2 IMPLANT
GOWN STRL REUS W/TWL LRG LVL3 (GOWN DISPOSABLE) ×4
LABEL CATARACT MEDS ST (LABEL) ×3 IMPLANT
LENS IOL TECNIS ITEC 21.5 (Intraocular Lens) ×2 IMPLANT
PACK CATARACT (MISCELLANEOUS) ×3 IMPLANT
PACK CATARACT BRASINGTON LX (MISCELLANEOUS) ×3 IMPLANT
PACK EYE AFTER SURG (MISCELLANEOUS) ×3 IMPLANT
SOL BSS BAG (MISCELLANEOUS) ×3
SOLUTION BSS BAG (MISCELLANEOUS) ×1 IMPLANT
SYR 5ML LL (SYRINGE) ×3 IMPLANT
WATER STERILE IRR 250ML POUR (IV SOLUTION) ×3 IMPLANT
WIPE NON LINTING 3.25X3.25 (MISCELLANEOUS) ×3 IMPLANT

## 2017-11-28 NOTE — Anesthesia Post-op Follow-up Note (Signed)
Anesthesia QCDR form completed.        

## 2017-11-28 NOTE — Op Note (Signed)
PREOPERATIVE DIAGNOSIS:  Nuclear sclerotic cataract of the right eye.   POSTOPERATIVE DIAGNOSIS:  nuclear sclerotic cataract right eye   OPERATIVE PROCEDURE: Procedure(s): CATARACT EXTRACTION PHACO AND INTRAOCULAR LENS PLACEMENT (IOC)   SURGEON:  Birder Robson, MD.   ANESTHESIA:  Anesthesiologist: Gunnar Fusi, MD CRNA: Aline Brochure, CRNA; Bernardo Heater, CRNA  1.      Managed anesthesia care. 2.      0.56ml of Shugarcaine was instilled in the eye following the paracentesis.   COMPLICATIONS:  None.   TECHNIQUE:   Stop and chop   DESCRIPTION OF PROCEDURE:  The patient was examined and consented in the preoperative holding area where the aforementioned topical anesthesia was applied to the right eye and then brought back to the Operating Room where the right eye was prepped and draped in the usual sterile ophthalmic fashion and a lid speculum was placed. A paracentesis was created with the side port blade and the anterior chamber was filled with viscoelastic. A near clear corneal incision was performed with the steel keratome. A continuous curvilinear capsulorrhexis was performed with a cystotome followed by the capsulorrhexis forceps. Hydrodissection and hydrodelineation were carried out with BSS on a blunt cannula. The lens was removed in a stop and chop  technique and the remaining cortical material was removed with the irrigation-aspiration handpiece. The capsular bag was inflated with viscoelastic and the Technis [id number]  lens was placed in the capsular bag without complication. The remaining viscoelastic was removed from the eye with the irrigation-aspiration handpiece. The wounds were hydrated. The anterior chamber was flushed with Miostat and the eye was inflated to physiologic pressure. 0.48ml of Vigamox was placed in the anterior chamber. The wounds were found to be water tight. The eye was dressed with Vigamox. The patient was given protective glasses to wear throughout the day  and a shield with which to sleep tonight. The patient was also given drops with which to begin a drop regimen today and will follow-up with me in one day. Implant Name Type Inv. Item Serial No. Manufacturer Lot No. LRB No. Used  LENS IOL DIOP 21.5 - [id number] Intraocular Lens LENS IOL DIOP 21.5 [id number] AMO  Right 1   Procedure(s) with comments: CATARACT EXTRACTION PHACO AND INTRAOCULAR LENS PLACEMENT (IOC) (Right) - [place] 00:32 AP% 11.3 CDE 3.66 Fluid pack lot # [id number]  Electronically signed: Birder Robson 11/28/2017 9:33 AM

## 2017-11-28 NOTE — H&P (Signed)
All labs reviewed. Abnormal studies sent to patients PCP when indicated.  Previous H&P reviewed, patient examined, there are NO CHANGES.  Patrick Geissinger Porfilio7/30/20199:09 AM

## 2017-11-28 NOTE — Anesthesia Preprocedure Evaluation (Signed)
Anesthesia Evaluation  Patient identified by MRN, date of birth, ID band Patient awake    Reviewed: Allergy & Precautions, NPO status , Patient's Chart, lab work & pertinent test results  History of Anesthesia Complications Negative for: history of anesthetic complications  Airway Mallampati: III       Dental   Pulmonary neg sleep apnea, neg COPD,           Cardiovascular hypertension, Pt. on medications + CAD, + Past MI and + Cardiac Stents  (-) CHF (-) dysrhythmias (-) Valvular Problems/Murmurs     Neuro/Psych neg Seizures Depression    GI/Hepatic Neg liver ROS, GERD  Medicated,  Endo/Other  neg diabetes  Renal/GU Renal disease (stones)     Musculoskeletal   Abdominal   Peds  Hematology   Anesthesia Other Findings   Reproductive/Obstetrics                            Anesthesia Physical Anesthesia Plan  ASA: III  Anesthesia Plan: MAC   Post-op Pain Management:    Induction:   PONV Risk Score and Plan:   Airway Management Planned:   Additional Equipment:   Intra-op Plan:   Post-operative Plan:   Informed Consent: I have reviewed the patients History and Physical, chart, labs and discussed the procedure including the risks, benefits and alternatives for the proposed anesthesia with the patient or authorized representative who has indicated his/her understanding and acceptance.     Plan Discussed with:   Anesthesia Plan Comments:         Anesthesia Quick Evaluation

## 2017-11-28 NOTE — Transfer of Care (Signed)
Immediate Anesthesia Transfer of Care Note  Patient: Patrick Wilson  Procedure(s) Performed: CATARACT EXTRACTION PHACO AND INTRAOCULAR LENS PLACEMENT (IOC) (Right Eye)  Patient Location: Short Stay  Anesthesia Type:MAC  Level of Consciousness: awake, alert  and oriented  Airway & Oxygen Therapy: Patient Spontanous Breathing  Post-op Assessment: Post -op Vital signs reviewed and stable  Post vital signs: stable  Last Vitals:  Vitals Value Taken Time  BP 138/77 11/28/2017  9:36 AM  Temp 36.3 C 11/28/2017  9:35 AM  Pulse 66 11/28/2017  9:36 AM  Resp 12 11/28/2017  9:36 AM  SpO2 97 % 11/28/2017  9:36 AM    Last Pain:  Vitals:   11/28/17 0935  TempSrc: Temporal  PainSc:          Complications: No apparent anesthesia complications

## 2017-11-28 NOTE — Anesthesia Postprocedure Evaluation (Signed)
Anesthesia Post Note  Patient: Patrick Wilson  Procedure(s) Performed: CATARACT EXTRACTION PHACO AND INTRAOCULAR LENS PLACEMENT (IOC) (Right Eye)  Patient location during evaluation: Short Stay Anesthesia Type: MAC Level of consciousness: awake and alert Pain management: pain level controlled Vital Signs Assessment: post-procedure vital signs reviewed and stable Respiratory status: spontaneous breathing, nonlabored ventilation, respiratory function stable and patient connected to nasal cannula oxygen Cardiovascular status: stable and blood pressure returned to baseline Postop Assessment: no apparent nausea or vomiting Anesthetic complications: no     Last Vitals:  Vitals:   11/28/17 0935 11/28/17 0936  BP: 138/77 138/77  Pulse: 67 66  Resp: 16 12  Temp: (!) 36.3 C   SpO2: 97% 97%    Last Pain:  Vitals:   11/28/17 0935  TempSrc: Temporal  PainSc: 0-No pain                 Estill Batten

## 2017-11-28 NOTE — OR Nursing (Signed)
IV not documented in epic - d/c'd postop left hand, gauze/paper tape applied.

## 2017-11-28 NOTE — Discharge Instructions (Addendum)
Eye Surgery Discharge Instructions    Expect mild scratchy sensation or mild soreness. DO NOT RUB YOUR EYE!  The day of surgery:  Minimal physical activity, but bed rest is not required  No reading, computer work, or close hand work  No bending, lifting, or straining.  May watch TV  For 24 hours:  No driving, legal decisions, or alcoholic beverages  Safety precautions  Eat anything you prefer: It is better to start with liquids, then soup then solid foods.  Solar shield eyeglasses should be worn for comfort in the sunlight/patch while sleeping  Resume all regular medications including aspirin or Coumadin if these were discontinued prior to surgery. You may shower, bathe, shave, or wash your hair. Tylenol may be taken for mild discomfort. FOLLOW DR. PORFILIO'S POSTOP EYE DROP INSTRUCTION SHEET AS REVIEWED.  Call your doctor if you experience significant pain, nausea, or vomiting, fever > 101 or other signs of infection. [phone] or [phone] Specific instructions:  Follow-up Information    Birder Robson, MD Follow up.   Specialty:  Ophthalmology Why:  11/29/17 @ 8:10 am Contact information: [address] [place]  [address] [phone]

## 2017-12-13 DIAGNOSIS — E782 Mixed hyperlipidemia: Secondary | ICD-10-CM | POA: Diagnosis not present

## 2017-12-13 DIAGNOSIS — R972 Elevated prostate specific antigen [PSA]: Secondary | ICD-10-CM | POA: Diagnosis not present

## 2017-12-20 DIAGNOSIS — I2510 Atherosclerotic heart disease of native coronary artery without angina pectoris: Secondary | ICD-10-CM | POA: Diagnosis not present

## 2017-12-20 DIAGNOSIS — Z79899 Other long term (current) drug therapy: Secondary | ICD-10-CM | POA: Diagnosis not present

## 2017-12-20 DIAGNOSIS — E782 Mixed hyperlipidemia: Secondary | ICD-10-CM | POA: Diagnosis not present

## 2017-12-20 DIAGNOSIS — Z125 Encounter for screening for malignant neoplasm of prostate: Secondary | ICD-10-CM | POA: Diagnosis not present

## 2017-12-27 DIAGNOSIS — C44311 Basal cell carcinoma of skin of nose: Secondary | ICD-10-CM | POA: Diagnosis not present

## 2017-12-27 DIAGNOSIS — X32XXXA Exposure to sunlight, initial encounter: Secondary | ICD-10-CM | POA: Diagnosis not present

## 2017-12-27 DIAGNOSIS — D0461 Carcinoma in situ of skin of right upper limb, including shoulder: Secondary | ICD-10-CM | POA: Diagnosis not present

## 2017-12-27 DIAGNOSIS — L570 Actinic keratosis: Secondary | ICD-10-CM | POA: Diagnosis not present

## 2017-12-27 DIAGNOSIS — L821 Other seborrheic keratosis: Secondary | ICD-10-CM | POA: Diagnosis not present

## 2017-12-27 DIAGNOSIS — D485 Neoplasm of uncertain behavior of skin: Secondary | ICD-10-CM | POA: Diagnosis not present

## 2017-12-27 DIAGNOSIS — Z08 Encounter for follow-up examination after completed treatment for malignant neoplasm: Secondary | ICD-10-CM | POA: Diagnosis not present

## 2017-12-27 DIAGNOSIS — Z85828 Personal history of other malignant neoplasm of skin: Secondary | ICD-10-CM | POA: Diagnosis not present

## 2018-01-03 DIAGNOSIS — Z961 Presence of intraocular lens: Secondary | ICD-10-CM | POA: Diagnosis not present

## 2018-01-18 DIAGNOSIS — D0461 Carcinoma in situ of skin of right upper limb, including shoulder: Secondary | ICD-10-CM | POA: Diagnosis not present

## 2018-03-01 DIAGNOSIS — C44311 Basal cell carcinoma of skin of nose: Secondary | ICD-10-CM | POA: Diagnosis not present

## 2018-03-01 DIAGNOSIS — Z85828 Personal history of other malignant neoplasm of skin: Secondary | ICD-10-CM | POA: Diagnosis not present

## 2018-03-08 DIAGNOSIS — Z4802 Encounter for removal of sutures: Secondary | ICD-10-CM | POA: Diagnosis not present

## 2018-03-26 DIAGNOSIS — Z9889 Other specified postprocedural states: Secondary | ICD-10-CM | POA: Diagnosis not present

## 2018-03-26 DIAGNOSIS — I2510 Atherosclerotic heart disease of native coronary artery without angina pectoris: Secondary | ICD-10-CM | POA: Diagnosis not present

## 2018-03-26 DIAGNOSIS — E782 Mixed hyperlipidemia: Secondary | ICD-10-CM | POA: Diagnosis not present

## 2018-03-26 DIAGNOSIS — I10 Essential (primary) hypertension: Secondary | ICD-10-CM | POA: Diagnosis not present

## 2018-05-09 DIAGNOSIS — H6123 Impacted cerumen, bilateral: Secondary | ICD-10-CM | POA: Diagnosis not present

## 2018-05-09 DIAGNOSIS — H60339 Swimmer's ear, unspecified ear: Secondary | ICD-10-CM | POA: Diagnosis not present

## 2018-06-18 DIAGNOSIS — Z125 Encounter for screening for malignant neoplasm of prostate: Secondary | ICD-10-CM | POA: Diagnosis not present

## 2018-06-18 DIAGNOSIS — Z79899 Other long term (current) drug therapy: Secondary | ICD-10-CM | POA: Diagnosis not present

## 2018-06-18 DIAGNOSIS — E782 Mixed hyperlipidemia: Secondary | ICD-10-CM | POA: Diagnosis not present

## 2018-06-25 DIAGNOSIS — F320 Major depressive disorder, single episode, mild: Secondary | ICD-10-CM | POA: Diagnosis not present

## 2018-06-25 DIAGNOSIS — Z0000 Encounter for general adult medical examination without abnormal findings: Secondary | ICD-10-CM | POA: Diagnosis not present

## 2018-06-25 DIAGNOSIS — E782 Mixed hyperlipidemia: Secondary | ICD-10-CM | POA: Diagnosis not present

## 2018-06-28 IMAGING — CR DG ABDOMEN 1V
2 series · 2 of 2 positions shown · non-contrast
Comparison: Radiographs November 13, 2015.

CLINICAL DATA: Kidney stones.

EXAM:
ABDOMEN - 1 VIEW

[abdomen kub (1 of 2)]
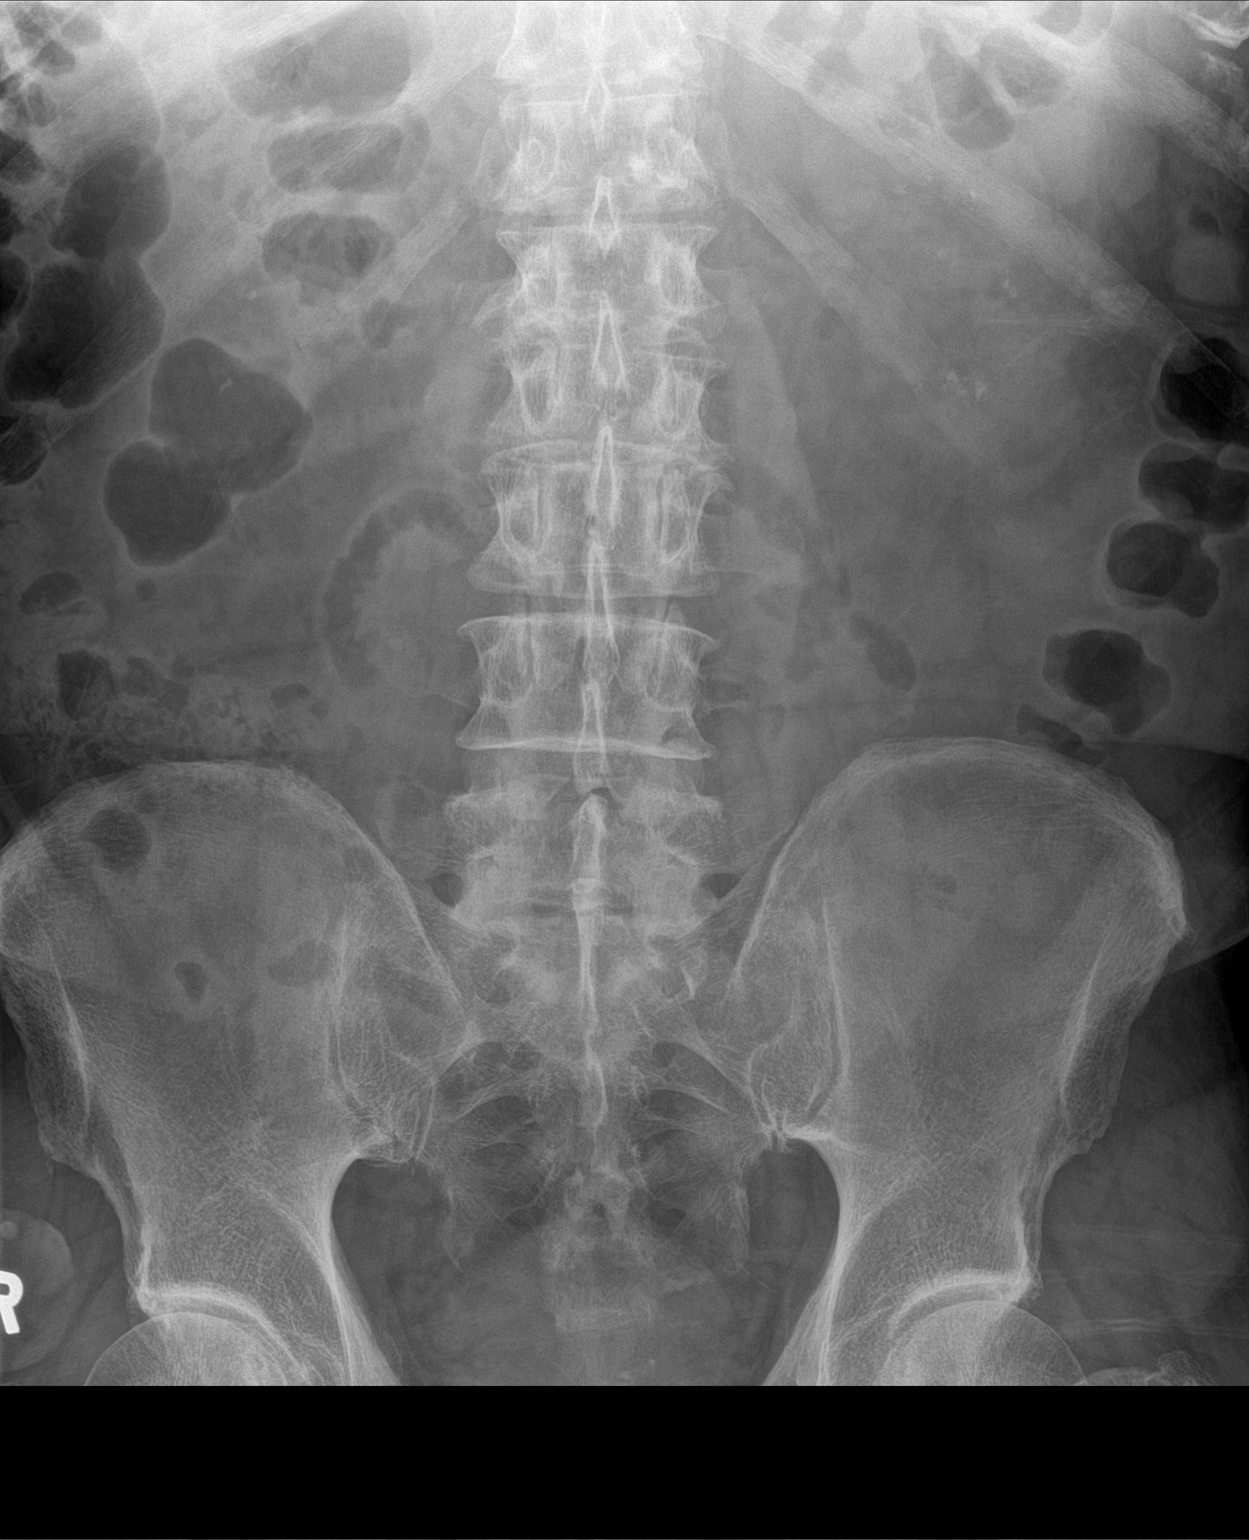

[abdomen kub (2 of 2)]
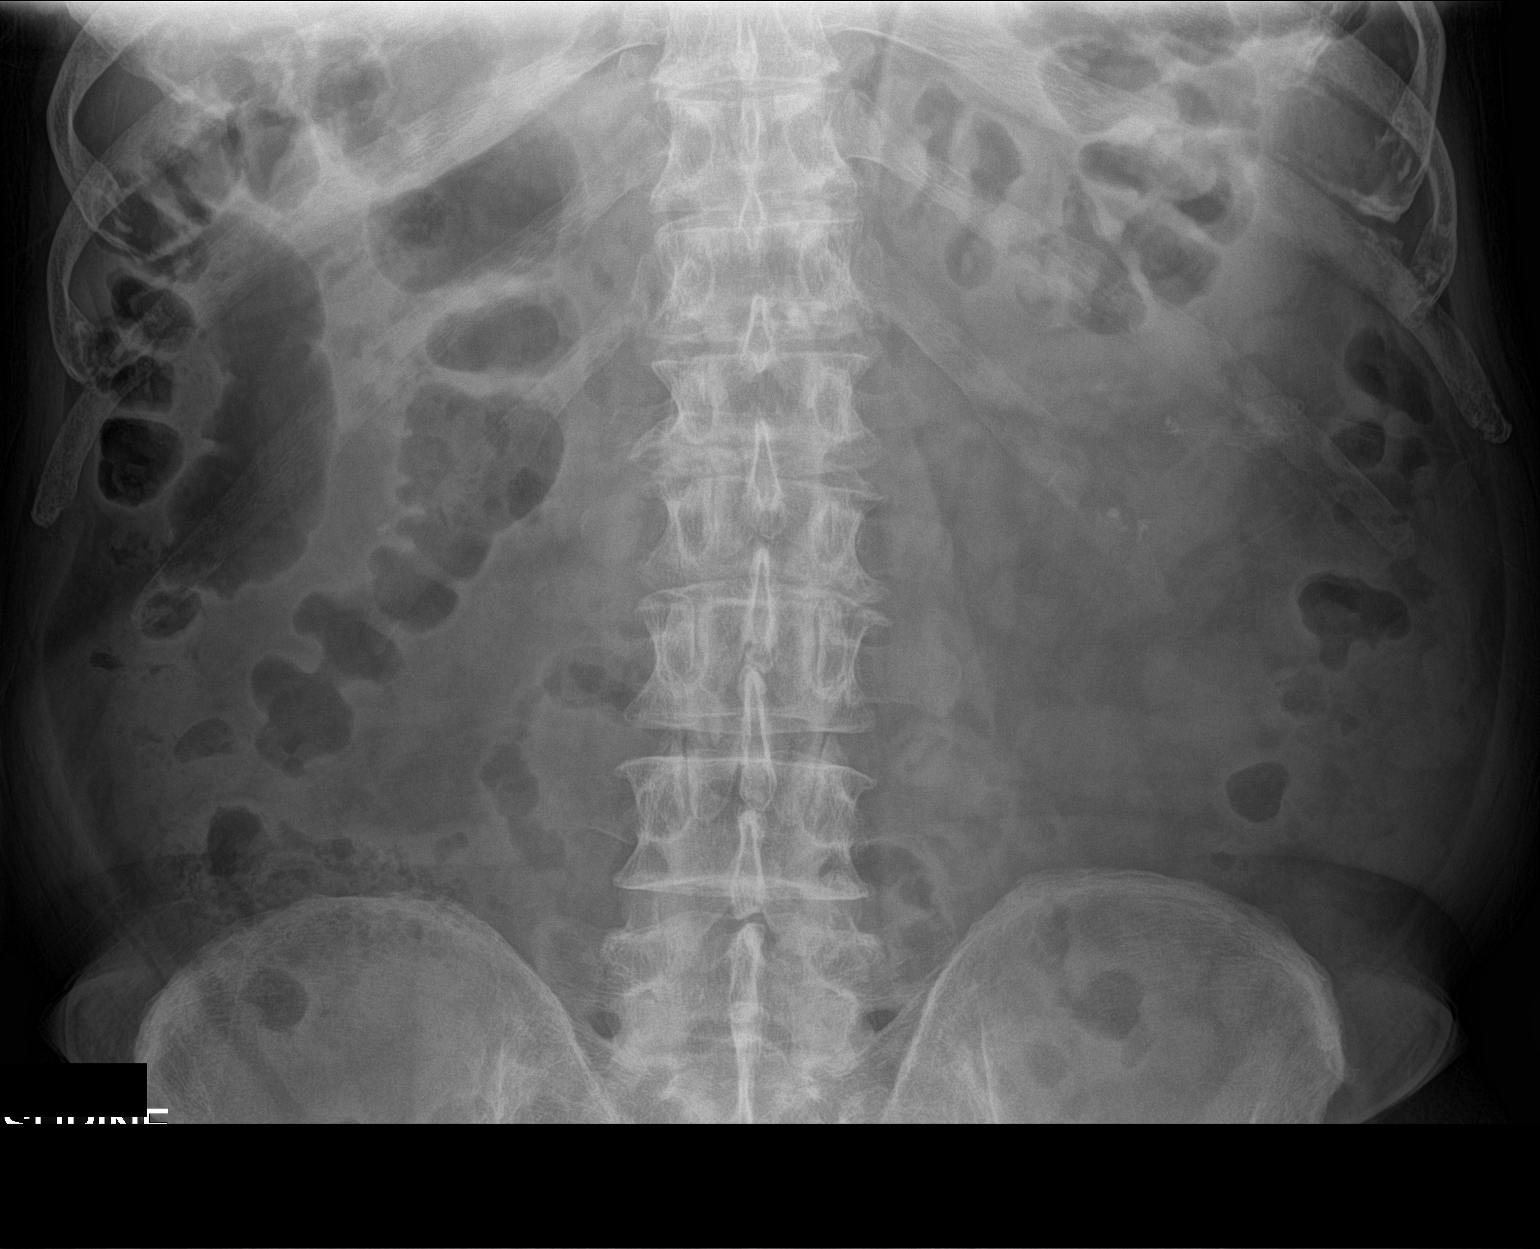

[2 of 2 positions shown; findings below may reference images not displayed]

FINDINGS: The bowel gas pattern is normal. Stable bilateral nephrolithiasis.
Possible left ureteral calculus noted on prior study is not well
visualized currently. Calcifications are seen in the pelvis which
are stable and may represent phleboliths.
IMPRESSION: Stable bilateral nephrolithiasis. No evidence of bowel obstruction
or ileus.

## 2018-07-19 DIAGNOSIS — H11003 Unspecified pterygium of eye, bilateral: Secondary | ICD-10-CM | POA: Diagnosis not present

## 2018-08-10 IMAGING — CR DG ABDOMEN 1V
1 series · 2 of 2 positions shown · non-contrast
Comparison: 12/03/2015

CLINICAL DATA: Left-sided nephrolithiasis.

EXAM:
ABDOMEN - 1 VIEW

[Series 1: dg abd 1 view · 0.14mm/px · 2 of 2 slices shown]
[im 1/2]
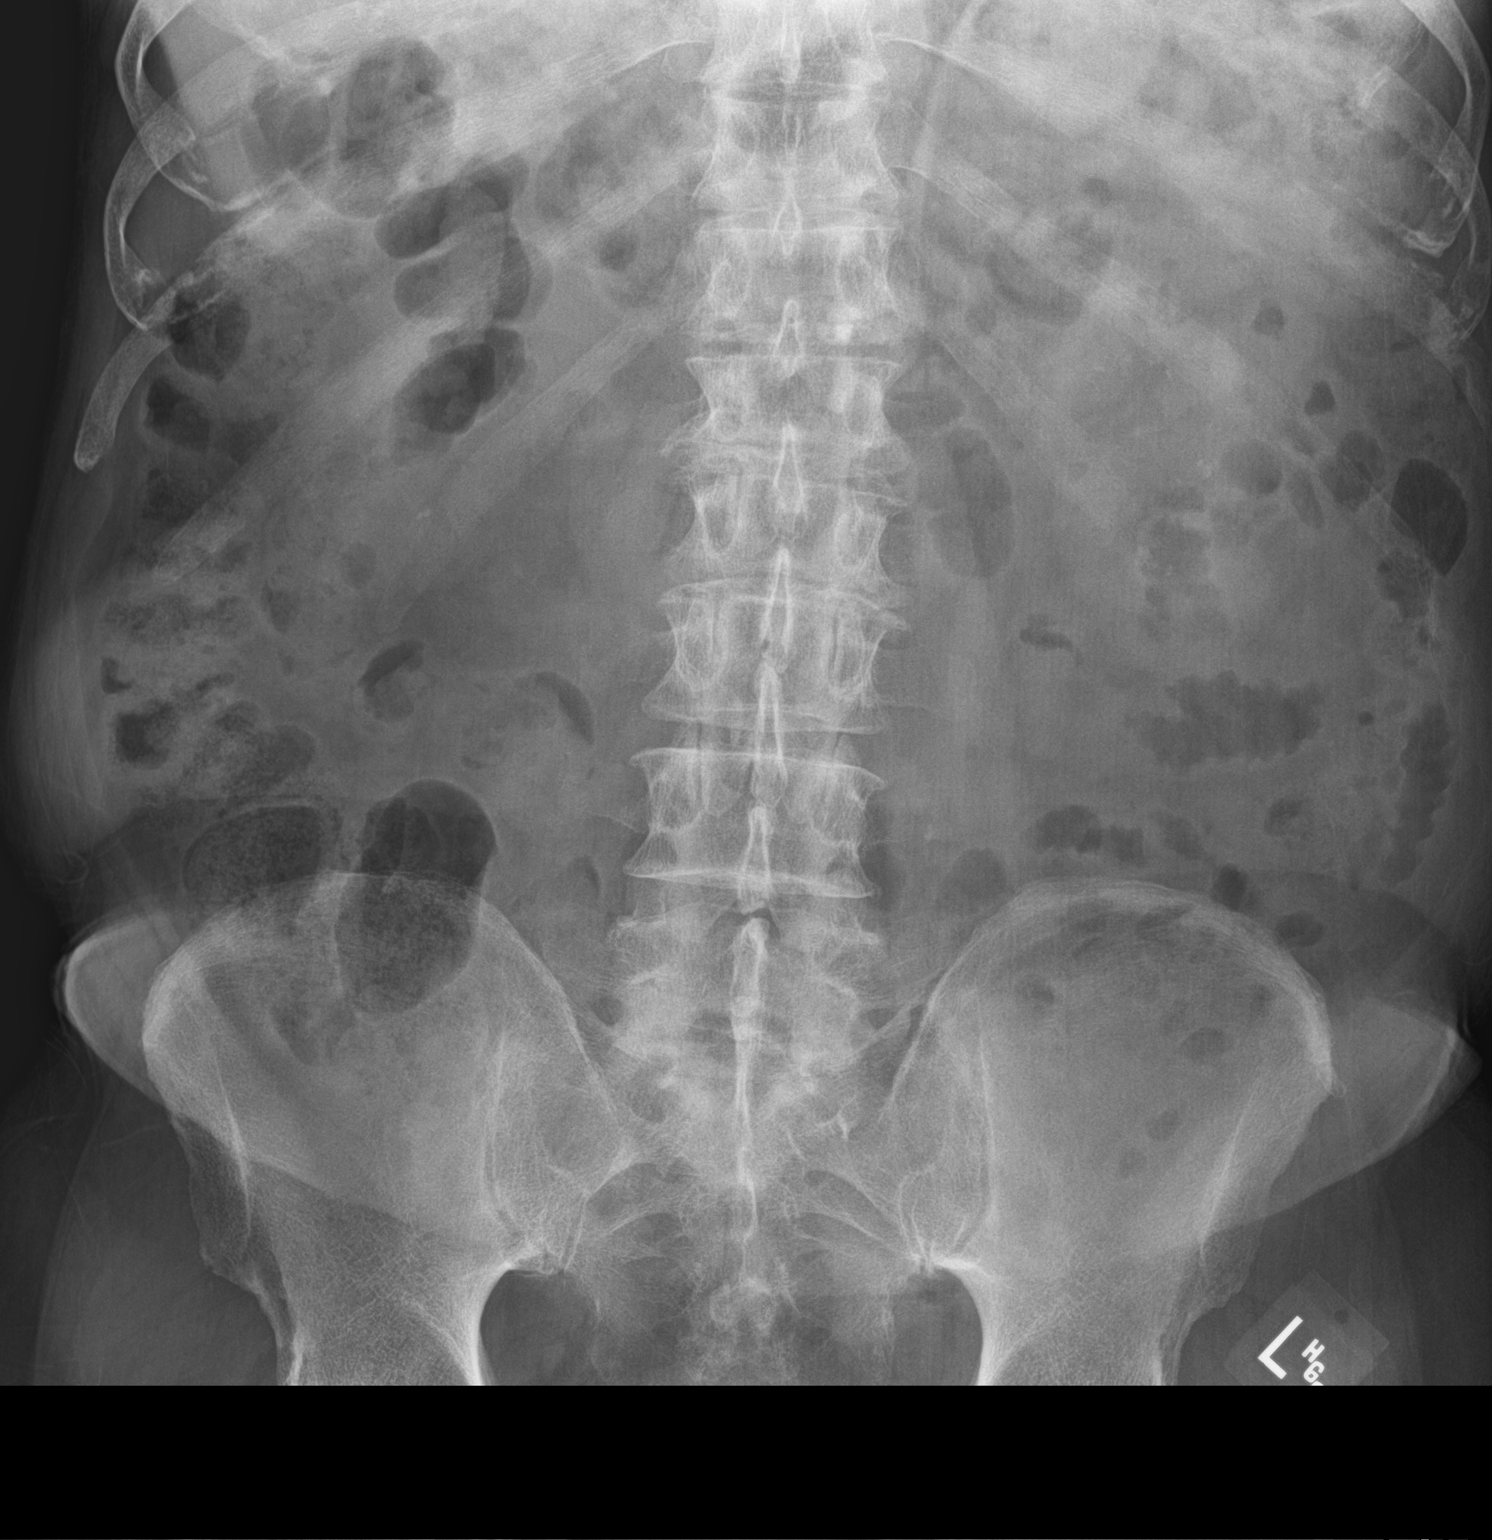
[im 2/2]
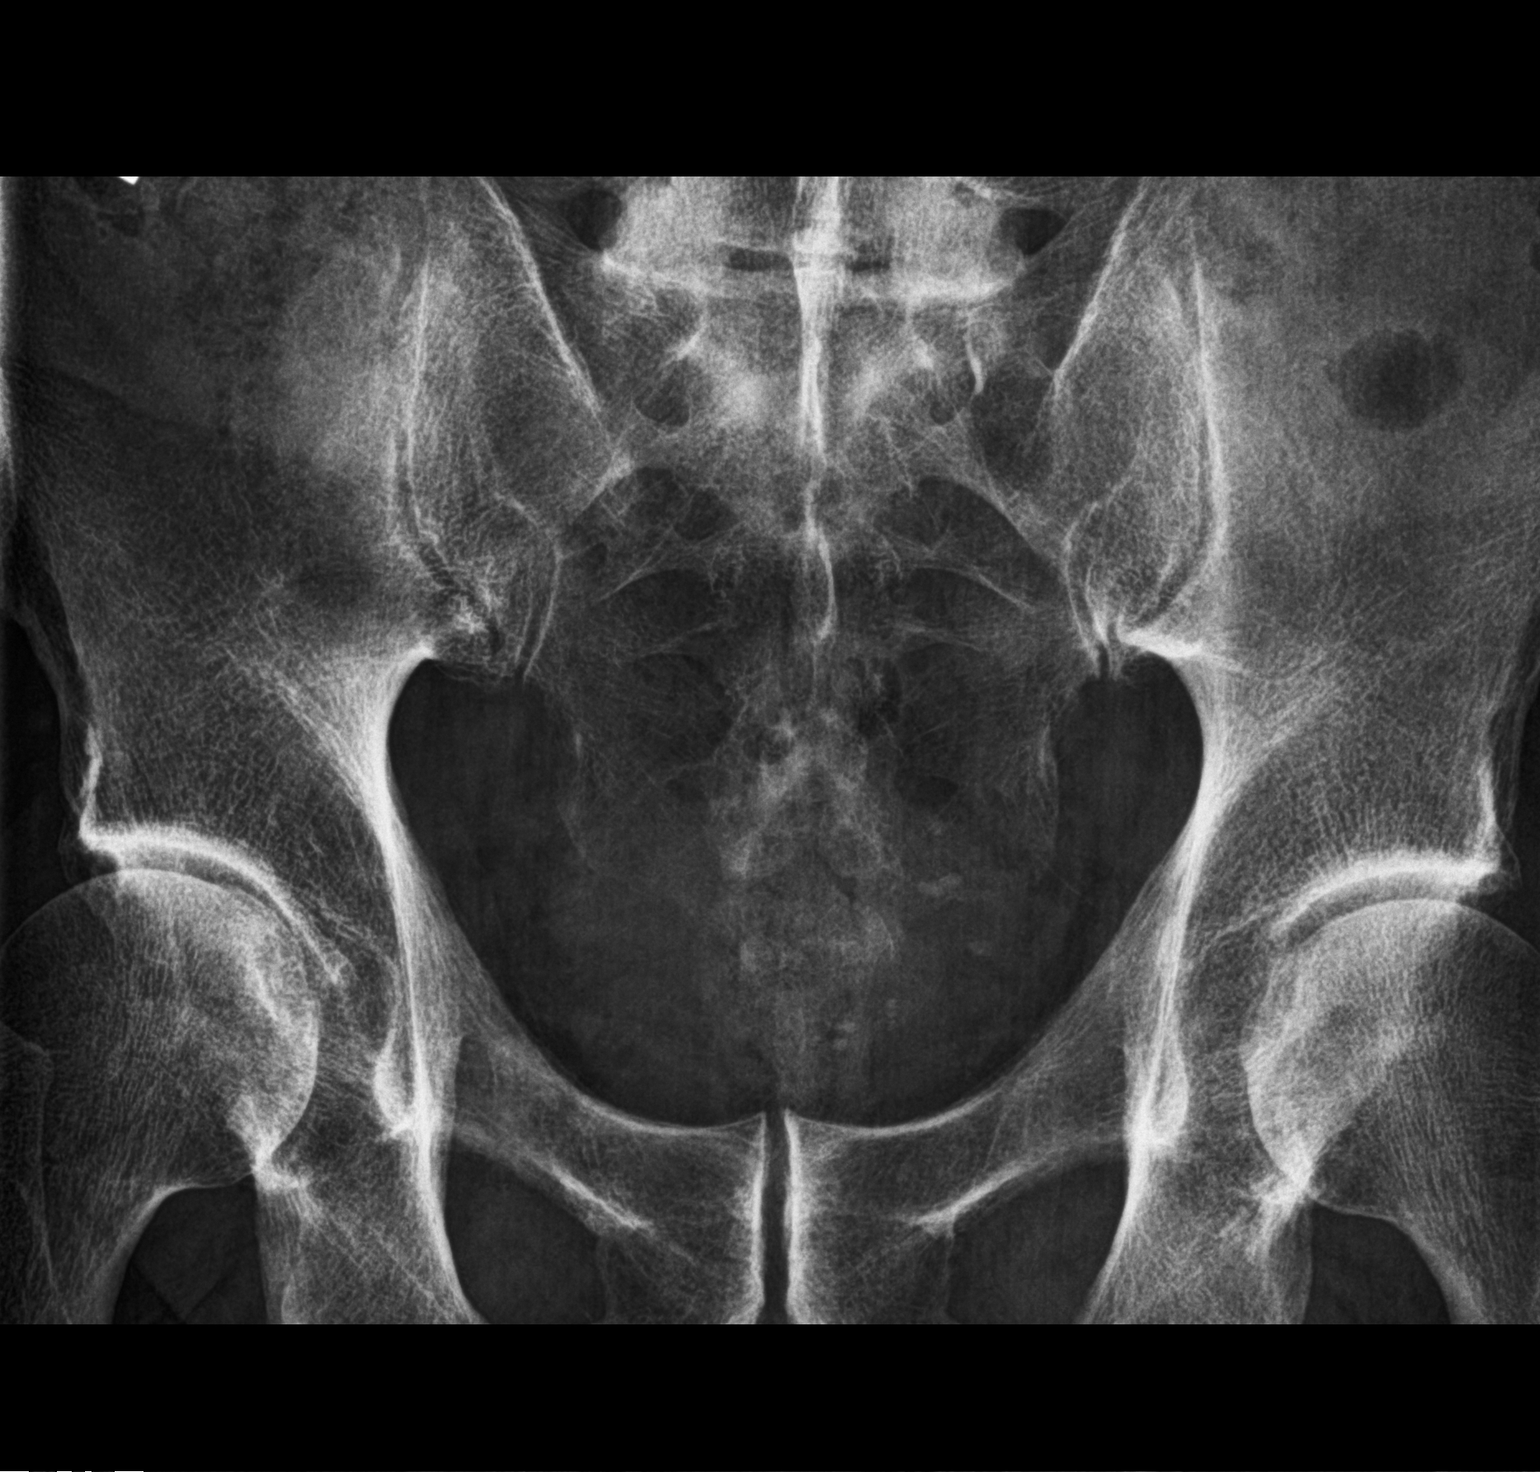

[2 of 2 positions shown; findings below may reference images not displayed]

FINDINGS: Probable tiny radiopaque stones overlying lower pole right kidney.
Multiple tiny stones are seen overlying the left kidney. No definite
ureteral stone on either side. Phleboliths overlie the anatomic
pelvis.
IMPRESSION: Bilateral nephrolithiasis.

## 2018-09-13 DIAGNOSIS — R0789 Other chest pain: Secondary | ICD-10-CM | POA: Diagnosis not present

## 2018-09-13 DIAGNOSIS — F430 Acute stress reaction: Secondary | ICD-10-CM | POA: Diagnosis not present

## 2018-09-13 DIAGNOSIS — M25551 Pain in right hip: Secondary | ICD-10-CM | POA: Diagnosis not present

## 2018-10-01 ENCOUNTER — Other Ambulatory Visit: Payer: Self-pay | Admitting: Internal Medicine

## 2018-10-01 DIAGNOSIS — R0789 Other chest pain: Secondary | ICD-10-CM

## 2018-10-01 DIAGNOSIS — K210 Gastro-esophageal reflux disease with esophagitis: Secondary | ICD-10-CM | POA: Diagnosis not present

## 2018-10-02 ENCOUNTER — Other Ambulatory Visit: Payer: Self-pay

## 2018-10-02 ENCOUNTER — Ambulatory Visit
Admission: RE | Admit: 2018-10-02 | Discharge: 2018-10-02 | Disposition: A | Discharge status: Home / Self Care | Payer: Medicare HMO | Source: Ambulatory Visit | Attending: Internal Medicine | Admitting: Internal Medicine

## 2018-10-02 DIAGNOSIS — R1013 Epigastric pain: Secondary | ICD-10-CM | POA: Diagnosis not present

## 2018-10-02 DIAGNOSIS — R0789 Other chest pain: Secondary | ICD-10-CM | POA: Insufficient documentation

## 2018-10-08 DIAGNOSIS — M5010 Cervical disc disorder with radiculopathy, unspecified cervical region: Secondary | ICD-10-CM | POA: Diagnosis not present

## 2018-10-08 DIAGNOSIS — R0789 Other chest pain: Secondary | ICD-10-CM | POA: Diagnosis not present

## 2018-10-15 ENCOUNTER — Other Ambulatory Visit: Payer: Self-pay

## 2018-10-15 ENCOUNTER — Ambulatory Visit
Admission: RE | Admit: 2018-10-15 | Discharge: 2018-10-15 | Disposition: A | Discharge status: Home / Self Care | Payer: Medicare HMO | Source: Ambulatory Visit | Attending: Urology | Admitting: Urology

## 2018-10-15 ENCOUNTER — Other Ambulatory Visit: Payer: Self-pay | Admitting: Internal Medicine

## 2018-10-15 ENCOUNTER — Inpatient Hospital Stay
Admission: RE | Admit: 2018-10-15 | Discharge: 2018-10-15 | Disposition: A | Discharge status: Home / Self Care | Payer: Medicare HMO | Source: Ambulatory Visit | Attending: Internal Medicine | Admitting: Internal Medicine

## 2018-10-15 DIAGNOSIS — I252 Old myocardial infarction: Secondary | ICD-10-CM | POA: Diagnosis not present

## 2018-10-15 DIAGNOSIS — I2111 ST elevation (STEMI) myocardial infarction involving right coronary artery: Secondary | ICD-10-CM | POA: Diagnosis present

## 2018-10-15 DIAGNOSIS — N179 Acute kidney failure, unspecified: Secondary | ICD-10-CM | POA: Diagnosis present

## 2018-10-15 DIAGNOSIS — R0600 Dyspnea, unspecified: Secondary | ICD-10-CM | POA: Diagnosis not present

## 2018-10-15 DIAGNOSIS — I2119 ST elevation (STEMI) myocardial infarction involving other coronary artery of inferior wall: Secondary | ICD-10-CM | POA: Diagnosis present

## 2018-10-15 DIAGNOSIS — F419 Anxiety disorder, unspecified: Secondary | ICD-10-CM | POA: Diagnosis present

## 2018-10-15 DIAGNOSIS — R0609 Other forms of dyspnea: Secondary | ICD-10-CM

## 2018-10-15 DIAGNOSIS — R0789 Other chest pain: Secondary | ICD-10-CM

## 2018-10-15 DIAGNOSIS — Z7982 Long term (current) use of aspirin: Secondary | ICD-10-CM | POA: Diagnosis not present

## 2018-10-15 DIAGNOSIS — Z01810 Encounter for preprocedural cardiovascular examination: Secondary | ICD-10-CM | POA: Diagnosis not present

## 2018-10-15 DIAGNOSIS — I213 ST elevation (STEMI) myocardial infarction of unspecified site: Secondary | ICD-10-CM | POA: Diagnosis not present

## 2018-10-15 DIAGNOSIS — E785 Hyperlipidemia, unspecified: Secondary | ICD-10-CM | POA: Diagnosis present

## 2018-10-15 DIAGNOSIS — R0602 Shortness of breath: Secondary | ICD-10-CM | POA: Diagnosis not present

## 2018-10-15 DIAGNOSIS — F329 Major depressive disorder, single episode, unspecified: Secondary | ICD-10-CM | POA: Diagnosis present

## 2018-10-15 DIAGNOSIS — R531 Weakness: Secondary | ICD-10-CM | POA: Diagnosis not present

## 2018-10-15 DIAGNOSIS — Z7902 Long term (current) use of antithrombotics/antiplatelets: Secondary | ICD-10-CM | POA: Diagnosis not present

## 2018-10-15 DIAGNOSIS — Z791 Long term (current) use of non-steroidal anti-inflammatories (NSAID): Secondary | ICD-10-CM | POA: Diagnosis not present

## 2018-10-15 DIAGNOSIS — I10 Essential (primary) hypertension: Secondary | ICD-10-CM | POA: Diagnosis present

## 2018-10-15 DIAGNOSIS — R079 Chest pain, unspecified: Secondary | ICD-10-CM | POA: Diagnosis present

## 2018-10-15 DIAGNOSIS — R338 Other retention of urine: Secondary | ICD-10-CM | POA: Diagnosis present

## 2018-10-15 DIAGNOSIS — N401 Enlarged prostate with lower urinary tract symptoms: Secondary | ICD-10-CM | POA: Diagnosis present

## 2018-10-15 DIAGNOSIS — Z955 Presence of coronary angioplasty implant and graft: Secondary | ICD-10-CM | POA: Diagnosis not present

## 2018-10-15 DIAGNOSIS — I2102 ST elevation (STEMI) myocardial infarction involving left anterior descending coronary artery: Secondary | ICD-10-CM | POA: Diagnosis not present

## 2018-10-15 DIAGNOSIS — K219 Gastro-esophageal reflux disease without esophagitis: Secondary | ICD-10-CM | POA: Diagnosis present

## 2018-10-15 DIAGNOSIS — I25119 Atherosclerotic heart disease of native coronary artery with unspecified angina pectoris: Secondary | ICD-10-CM | POA: Diagnosis present

## 2018-10-15 DIAGNOSIS — Z20828 Contact with and (suspected) exposure to other viral communicable diseases: Secondary | ICD-10-CM | POA: Diagnosis present

## 2018-10-15 DIAGNOSIS — Z880 Allergy status to penicillin: Secondary | ICD-10-CM | POA: Diagnosis not present

## 2018-10-15 DIAGNOSIS — Z87442 Personal history of urinary calculi: Secondary | ICD-10-CM | POA: Diagnosis not present

## 2018-10-15 DIAGNOSIS — Z85828 Personal history of other malignant neoplasm of skin: Secondary | ICD-10-CM | POA: Diagnosis not present

## 2018-10-15 MED ORDER — IOHEXOL 350 MG/ML SOLN
75.0000 mL | Freq: Once | INTRAVENOUS | Status: AC | PRN
  Administered 2018-10-15: 75 mL via INTRAVENOUS

## 2018-10-17 ENCOUNTER — Encounter
Admission: EM | Disposition: A | Discharge status: Home / Self Care | Payer: Self-pay | Source: Home / Self Care | Attending: Family Medicine

## 2018-10-17 ENCOUNTER — Other Ambulatory Visit: Payer: Self-pay

## 2018-10-17 ENCOUNTER — Inpatient Hospital Stay
Admission: EM | Admit: 2018-10-17 | Discharge: 2018-10-18 | DRG: 247 | Disposition: A | Discharge status: Home / Self Care | Payer: Medicare HMO | Source: Ambulatory Visit | Attending: Specialist | Admitting: Specialist

## 2018-10-17 ENCOUNTER — Emergency Department: Payer: Medicare HMO

## 2018-10-17 DIAGNOSIS — Z20828 Contact with and (suspected) exposure to other viral communicable diseases: Secondary | ICD-10-CM | POA: Diagnosis present

## 2018-10-17 DIAGNOSIS — R338 Other retention of urine: Secondary | ICD-10-CM | POA: Diagnosis present

## 2018-10-17 DIAGNOSIS — K219 Gastro-esophageal reflux disease without esophagitis: Secondary | ICD-10-CM | POA: Diagnosis present

## 2018-10-17 DIAGNOSIS — F329 Major depressive disorder, single episode, unspecified: Secondary | ICD-10-CM | POA: Diagnosis present

## 2018-10-17 DIAGNOSIS — Z7982 Long term (current) use of aspirin: Secondary | ICD-10-CM

## 2018-10-17 DIAGNOSIS — Z955 Presence of coronary angioplasty implant and graft: Secondary | ICD-10-CM

## 2018-10-17 DIAGNOSIS — I213 ST elevation (STEMI) myocardial infarction of unspecified site: Secondary | ICD-10-CM | POA: Diagnosis present

## 2018-10-17 DIAGNOSIS — Z880 Allergy status to penicillin: Secondary | ICD-10-CM | POA: Diagnosis not present

## 2018-10-17 DIAGNOSIS — N401 Enlarged prostate with lower urinary tract symptoms: Secondary | ICD-10-CM | POA: Diagnosis present

## 2018-10-17 DIAGNOSIS — I10 Essential (primary) hypertension: Secondary | ICD-10-CM | POA: Diagnosis present

## 2018-10-17 DIAGNOSIS — E785 Hyperlipidemia, unspecified: Secondary | ICD-10-CM | POA: Diagnosis present

## 2018-10-17 DIAGNOSIS — I25119 Atherosclerotic heart disease of native coronary artery with unspecified angina pectoris: Secondary | ICD-10-CM | POA: Diagnosis present

## 2018-10-17 DIAGNOSIS — Z7902 Long term (current) use of antithrombotics/antiplatelets: Secondary | ICD-10-CM | POA: Diagnosis not present

## 2018-10-17 DIAGNOSIS — Z85828 Personal history of other malignant neoplasm of skin: Secondary | ICD-10-CM | POA: Diagnosis not present

## 2018-10-17 DIAGNOSIS — I2119 ST elevation (STEMI) myocardial infarction involving other coronary artery of inferior wall: Secondary | ICD-10-CM | POA: Diagnosis present

## 2018-10-17 DIAGNOSIS — Z791 Long term (current) use of non-steroidal anti-inflammatories (NSAID): Secondary | ICD-10-CM

## 2018-10-17 DIAGNOSIS — I2111 ST elevation (STEMI) myocardial infarction involving right coronary artery: Secondary | ICD-10-CM | POA: Diagnosis present

## 2018-10-17 DIAGNOSIS — N179 Acute kidney failure, unspecified: Secondary | ICD-10-CM | POA: Diagnosis present

## 2018-10-17 DIAGNOSIS — F419 Anxiety disorder, unspecified: Secondary | ICD-10-CM | POA: Diagnosis present

## 2018-10-17 DIAGNOSIS — R0789 Other chest pain: Secondary | ICD-10-CM | POA: Diagnosis not present

## 2018-10-17 DIAGNOSIS — Z87442 Personal history of urinary calculi: Secondary | ICD-10-CM

## 2018-10-17 DIAGNOSIS — I252 Old myocardial infarction: Secondary | ICD-10-CM

## 2018-10-17 DIAGNOSIS — R079 Chest pain, unspecified: Secondary | ICD-10-CM | POA: Diagnosis present

## 2018-10-17 HISTORY — PX: CORONARY/GRAFT ACUTE MI REVASCULARIZATION: CATH118305

## 2018-10-17 HISTORY — PX: LEFT HEART CATH AND CORONARY ANGIOGRAPHY: CATH118249

## 2018-10-17 LAB — CBC WITH DIFFERENTIAL/PLATELET
Abs Immature Granulocytes: 0.29 10*3/uL — ABNORMAL HIGH (ref 0.00–0.07)
Basophils Absolute: 0.1 10*3/uL (ref 0.0–0.1)
Basophils Relative: 0 %
Eosinophils Absolute: 0 10*3/uL (ref 0.0–0.5)
Eosinophils Relative: 0 %
HCT: 48.6 % (ref 39.0–52.0)
Hemoglobin: 16 g/dL (ref 13.0–17.0)
Immature Granulocytes: 1 %
Lymphocytes Relative: 16 %
Lymphs Abs: 3.2 10*3/uL (ref 0.7–4.0)
MCH: 31.6 pg (ref 26.0–34.0)
MCHC: 32.9 g/dL (ref 30.0–36.0)
MCV: 95.9 fL (ref 80.0–100.0)
Monocytes Absolute: 2 10*3/uL — ABNORMAL HIGH (ref 0.1–1.0)
Monocytes Relative: 10 %
Neutro Abs: 14.9 10*3/uL — ABNORMAL HIGH (ref 1.7–7.7)
Neutrophils Relative %: 73 %
Platelets: 254 10*3/uL (ref 150–400)
RBC: 5.07 MIL/uL (ref 4.22–5.81)
RDW: 13.4 % (ref 11.5–15.5)
WBC: 20.4 10*3/uL — ABNORMAL HIGH (ref 4.0–10.5)
nRBC: 0 % (ref 0.0–0.2)

## 2018-10-17 LAB — POCT ACTIVATED CLOTTING TIME: Activated Clotting Time: 411 seconds

## 2018-10-17 LAB — APTT: aPTT: 25 seconds (ref 24–36)

## 2018-10-17 LAB — COMPREHENSIVE METABOLIC PANEL
ALT: 39 U/L (ref 0–44)
AST: 32 U/L (ref 15–41)
Albumin: 4.1 g/dL (ref 3.5–5.0)
Alkaline Phosphatase: 59 U/L (ref 38–126)
Anion gap: 16 — ABNORMAL HIGH (ref 5–15)
BUN: 23 mg/dL (ref 8–23)
CO2: 24 mmol/L (ref 22–32)
Calcium: 9.7 mg/dL (ref 8.9–10.3)
Chloride: 102 mmol/L (ref 98–111)
Creatinine, Ser: 1.35 mg/dL — ABNORMAL HIGH (ref 0.61–1.24)
GFR calc Af Amer: 56 mL/min — ABNORMAL LOW (ref >60)
GFR calc non Af Amer: 48 mL/min — ABNORMAL LOW (ref >60)
Glucose, Bld: 97 mg/dL (ref 70–99)
Potassium: 4.5 mmol/L (ref 3.5–5.1)
Sodium: 142 mmol/L (ref 135–145)
Total Bilirubin: 0.6 mg/dL (ref 0.3–1.2)
Total Protein: 6.9 g/dL (ref 6.5–8.1)

## 2018-10-17 LAB — LIPID PANEL
Cholesterol: 138 mg/dL (ref 0–200)
HDL: 64 mg/dL (ref >40)
LDL Cholesterol: 54 mg/dL (ref 0–99)
Total CHOL/HDL Ratio: 2.2 RATIO
Triglycerides: 101 mg/dL (ref <150)
VLDL: 20 mg/dL (ref 0–40)

## 2018-10-17 LAB — PROTIME-INR
INR: 1 (ref 0.8–1.2)
Prothrombin Time: 12.9 seconds (ref 11.4–15.2)

## 2018-10-17 LAB — TROPONIN I
Troponin I: 0.03 ng/mL (ref <0.03)
Troponin I: 0.04 ng/mL (ref <0.03)

## 2018-10-17 LAB — MRSA PCR SCREENING: MRSA by PCR: NEGATIVE

## 2018-10-17 LAB — SARS CORONAVIRUS 2 BY RT PCR (HOSPITAL ORDER, PERFORMED IN ~~LOC~~ HOSPITAL LAB): SARS Coronavirus 2: NEGATIVE

## 2018-10-17 SURGERY — CORONARY/GRAFT ACUTE MI REVASCULARIZATION
Anesthesia: Moderate Sedation

## 2018-10-17 MED ORDER — AMLODIPINE BESYLATE 5 MG PO TABS: 5.0000 mg | ORAL_TABLET | Freq: Every day | ORAL | Status: DC

## 2018-10-17 MED ORDER — MAGNESIUM GLUCONATE 500 MG PO TABS
500.0000 mg | ORAL_TABLET | Freq: Every day | ORAL | Status: DC
  Administered 2018-10-18: 500 mg via ORAL
  Filled 2018-10-17: qty 1

## 2018-10-17 MED ORDER — HEPARIN (PORCINE) IN NACL 1000-0.9 UT/500ML-% IV SOLN
INTRAVENOUS | Status: DC | PRN
  Administered 2018-10-17: 500 mL

## 2018-10-17 MED ORDER — LABETALOL HCL 5 MG/ML IV SOLN: 10.0000 mg | INTRAVENOUS | Status: AC | PRN

## 2018-10-17 MED ORDER — TAMSULOSIN HCL 0.4 MG PO CAPS
0.4000 mg | ORAL_CAPSULE | Freq: Every day | ORAL | Status: DC
  Administered 2018-10-18: 0.4 mg via ORAL
  Filled 2018-10-17: qty 1

## 2018-10-17 MED ORDER — SODIUM CHLORIDE 0.9 % IV SOLN
INTRAVENOUS | Status: AC | PRN
  Administered 2018-10-17: 50 mL/h via INTRAVENOUS

## 2018-10-17 MED ORDER — CLOPIDOGREL BISULFATE 75 MG PO TABS
ORAL_TABLET | ORAL | Status: AC
  Filled 2018-10-17: qty 8

## 2018-10-17 MED ORDER — FENTANYL CITRATE (PF) 100 MCG/2ML IJ SOLN
INTRAMUSCULAR | Status: DC | PRN
  Administered 2018-10-17: 25 ug via INTRAVENOUS

## 2018-10-17 MED ORDER — SODIUM CHLORIDE 0.9 % IV SOLN: INTRAVENOUS | Status: DC

## 2018-10-17 MED ORDER — SODIUM CHLORIDE 0.9 % IV SOLN
0.2500 mg/kg/h | INTRAVENOUS | Status: AC
  Administered 2018-10-17: 0.25 mg/kg/h via INTRAVENOUS
  Filled 2018-10-17: qty 250

## 2018-10-17 MED ORDER — FENTANYL CITRATE (PF) 100 MCG/2ML IJ SOLN
INTRAMUSCULAR | Status: AC
  Filled 2018-10-17: qty 2

## 2018-10-17 MED ORDER — HEPARIN SODIUM (PORCINE) 5000 UNIT/ML IJ SOLN
4000.0000 [IU] | Freq: Once | INTRAMUSCULAR | Status: AC
  Administered 2018-10-17: 4000 [IU] via INTRAVENOUS

## 2018-10-17 MED ORDER — NITROGLYCERIN 0.4 MG SL SUBL: 0.4000 mg | SUBLINGUAL_TABLET | SUBLINGUAL | Status: DC | PRN

## 2018-10-17 MED ORDER — BIVALIRUDIN TRIFLUOROACETATE 250 MG IV SOLR
INTRAVENOUS | Status: AC
  Filled 2018-10-17: qty 250

## 2018-10-17 MED ORDER — ASPIRIN 81 MG PO CHEW: 324.0000 mg | CHEWABLE_TABLET | Freq: Once | ORAL | Status: DC

## 2018-10-17 MED ORDER — TICAGRELOR 90 MG PO TABS
ORAL_TABLET | ORAL | Status: AC
  Filled 2018-10-17: qty 2

## 2018-10-17 MED ORDER — DOCUSATE SODIUM 100 MG PO CAPS
100.0000 mg | ORAL_CAPSULE | Freq: Two times a day (BID) | ORAL | Status: DC
  Administered 2018-10-17 – 2018-10-18 (×2): 100 mg via ORAL
  Filled 2018-10-17 (×2): qty 1

## 2018-10-17 MED ORDER — TICAGRELOR 90 MG PO TABS
ORAL_TABLET | ORAL | Status: DC | PRN
  Administered 2018-10-17: 180 mg via ORAL

## 2018-10-17 MED ORDER — BIVALIRUDIN BOLUS VIA INFUSION - CUPID
INTRAVENOUS | Status: DC | PRN
  Administered 2018-10-17: 57.15 mg via INTRAVENOUS

## 2018-10-17 MED ORDER — METOPROLOL TARTRATE 25 MG PO TABS
12.5000 mg | ORAL_TABLET | Freq: Two times a day (BID) | ORAL | Status: DC
  Administered 2018-10-17: 12.5 mg via ORAL

## 2018-10-17 MED ORDER — HEPARIN (PORCINE) IN NACL 1000-0.9 UT/500ML-% IV SOLN
INTRAVENOUS | Status: AC
  Filled 2018-10-17: qty 1000

## 2018-10-17 MED ORDER — SODIUM CHLORIDE 0.9 % IV SOLN: 250.0000 mL | INTRAVENOUS | Status: DC | PRN

## 2018-10-17 MED ORDER — SODIUM CHLORIDE 0.9% FLUSH: 3.0000 mL | Freq: Two times a day (BID) | INTRAVENOUS | Status: DC

## 2018-10-17 MED ORDER — B COMPLEX-C PO TABS
ORAL_TABLET | Freq: Every day | ORAL | Status: DC
  Administered 2018-10-18: 1 via ORAL
  Filled 2018-10-17: qty 1

## 2018-10-17 MED ORDER — TRAZODONE HCL 50 MG PO TABS
50.0000 mg | ORAL_TABLET | Freq: Every day | ORAL | Status: DC
  Administered 2018-10-17: 50 mg via ORAL
  Filled 2018-10-17: qty 1

## 2018-10-17 MED ORDER — IOHEXOL 300 MG/ML  SOLN
INTRAMUSCULAR | Status: DC | PRN
  Administered 2018-10-17: 215 mL via INTRA_ARTERIAL

## 2018-10-17 MED ORDER — ATORVASTATIN CALCIUM 80 MG PO TABS
80.0000 mg | ORAL_TABLET | Freq: Every day | ORAL | Status: DC
  Administered 2018-10-17: 80 mg via ORAL
  Filled 2018-10-17 (×2): qty 1

## 2018-10-17 MED ORDER — SODIUM CHLORIDE 0.9 % IV SOLN
INTRAVENOUS | Status: AC | PRN
  Administered 2018-10-17: 1.75 mg/kg/h via INTRAVENOUS

## 2018-10-17 MED ORDER — SODIUM CHLORIDE 0.9% FLUSH: 3.0000 mL | INTRAVENOUS | Status: DC | PRN

## 2018-10-17 MED ORDER — LISINOPRIL 10 MG PO TABS
5.0000 mg | ORAL_TABLET | Freq: Every day | ORAL | Status: DC
  Administered 2018-10-17: 5 mg via ORAL
  Filled 2018-10-17 (×2): qty 0.5

## 2018-10-17 MED ORDER — ASPIRIN 81 MG PO CHEW
81.0000 mg | CHEWABLE_TABLET | Freq: Every day | ORAL | Status: DC
  Administered 2018-10-18: 81 mg via ORAL
  Filled 2018-10-17: qty 1

## 2018-10-17 MED ORDER — PANTOPRAZOLE SODIUM 40 MG PO TBEC
40.0000 mg | DELAYED_RELEASE_TABLET | Freq: Every day | ORAL | Status: DC
  Administered 2018-10-18: 40 mg via ORAL
  Filled 2018-10-17: qty 1

## 2018-10-17 MED ORDER — VENLAFAXINE HCL ER 37.5 MG PO CP24
37.5000 mg | ORAL_CAPSULE | Freq: Every day | ORAL | Status: DC
  Administered 2018-10-18: 37.5 mg via ORAL
  Filled 2018-10-17: qty 1

## 2018-10-17 MED ORDER — TICAGRELOR 90 MG PO TABS
90.0000 mg | ORAL_TABLET | Freq: Two times a day (BID) | ORAL | Status: DC
  Administered 2018-10-17 – 2018-10-18 (×2): 90 mg via ORAL
  Filled 2018-10-17 (×2): qty 1

## 2018-10-17 MED ORDER — ACETAMINOPHEN 325 MG PO TABS: 650.0000 mg | ORAL_TABLET | ORAL | Status: DC | PRN

## 2018-10-17 MED ORDER — ONDANSETRON HCL 4 MG/2ML IJ SOLN: 4.0000 mg | Freq: Four times a day (QID) | INTRAMUSCULAR | Status: DC | PRN

## 2018-10-17 MED ORDER — ASPIRIN EC 81 MG PO TBEC: 81.0000 mg | DELAYED_RELEASE_TABLET | Freq: Every day | ORAL | Status: DC

## 2018-10-17 MED ORDER — MIDAZOLAM HCL 2 MG/2ML IJ SOLN
INTRAMUSCULAR | Status: AC
  Filled 2018-10-17: qty 2

## 2018-10-17 MED ORDER — HYDRALAZINE HCL 20 MG/ML IJ SOLN: 10.0000 mg | INTRAMUSCULAR | Status: AC | PRN

## 2018-10-17 MED ORDER — SODIUM CHLORIDE 0.9 % WEIGHT BASED INFUSION
1.0000 mL/kg/h | INTRAVENOUS | Status: AC
  Administered 2018-10-17: 1 mL/kg/h via INTRAVENOUS

## 2018-10-17 SURGICAL SUPPLY — 14 items
BALLN TREK RX 3.0X20 (BALLOONS) ×3
BALLOON TREK RX 3.0X20 (BALLOONS) ×1 IMPLANT
CATH INFINITI 5FR ANG PIGTAIL (CATHETERS) ×3 IMPLANT
CATH INFINITI 5FR JL4 (CATHETERS) ×3 IMPLANT
CATH VISTA GUIDE 6FR JR4 SH (CATHETERS) ×3 IMPLANT
DEVICE CLOSURE MYNXGRIP 6/7F (Vascular Products) ×3 IMPLANT
DEVICE INFLAT 30 PLUS (MISCELLANEOUS) ×3 IMPLANT
KIT MANI 3VAL PERCEP (MISCELLANEOUS) ×3 IMPLANT
NEEDLE PERC 18GX7CM (NEEDLE) ×3 IMPLANT
PACK CARDIAC CATH (CUSTOM PROCEDURE TRAY) ×3 IMPLANT
SHEATH AVANTI 6FR X 11CM (SHEATH) ×3 IMPLANT
STENT RESOLUTE ONYX 3.0X22 (Permanent Stent) ×3 IMPLANT
WIRE G HI TQ BMW 190 (WIRE) ×3 IMPLANT
WIRE GUIDERIGHT .035X150 (WIRE) ×3 IMPLANT

## 2018-10-17 NOTE — ED Triage Notes (Signed)
Pt arrived to ed from [facility] clinic where pt had a stress test completed. Pt appears to be having a STEMI at this time. Pt a&o x 4 on arrival. ED MD at bedside at this time. Pt received 2 baby Asprin at, SL nitro prior to arrival. Pt also took one 81mg  Asprin at home.

## 2018-10-17 NOTE — Plan of Care (Signed)

## 2018-10-17 NOTE — ED Notes (Signed)
Cardiologist arrived at bedside

## 2018-10-17 NOTE — H&P (Signed)
[facility] [place] NAME: Patrick Wilson    MR#:  [id number]  DATE OF BIRTH:  09/09/1933  DATE OF ADMISSION:  10/17/2018  PRIMARY CARE PHYSICIAN: Rusty Aus, MD   REQUESTING/REFERRING PHYSICIAN: Duffy Bruce, MD  CHIEF COMPLAINT:   Chief Complaint  Patient presents with   Code STEMI    HISTORY OF PRESENT ILLNESS:  Patrick Wilson  is a 85 y.o. male with a known history of coronary artery disease with history of MI and stent placement in 2012.  Patient is now status post cardiac catheterization with drug-eluting stent placement of RCA with Dr. Clayborn Bigness.  He originally presented to the emergency room reporting a 4-week history of intermittent midsternal chest pain described as pressure and burning at times with a pain score 10 out of 10.  Pain usually last less than 15 minutes with radiation to his right axillary area at times.  He noted associated shortness of breath and nausea however no diaphoresis associated with the pain.  Patient had originally taken antacids for what he felt like was heartburn with no relief.  He continues to have chest pain several times a day which was usually made worse with exertion.  He had undergone outpatient stress test demonstrating ST elevation inferiorly and was therefore transferred to the emergency room for possible STEMI.  Currently patient is status post cardiac catheterization with RCA drug-eluting stent placement.  He is lying in bed with no acute distress.  He is awake and alert.  He denies pain.  Patient has been admitted to the ICU by the hospitalist service for further management.  PAST MEDICAL HISTORY:   Past Medical History:  Diagnosis Date   Cancer [facility])    SKIN   Coronary artery disease    Depression    GERD (gastroesophageal reflux disease)    History of kidney stones    Hypertension    Myocardial infarction [facility])    2012    PAST SURGICAL HISTORY:   Past Surgical History:    Procedure Laterality Date   cardiac stents     CATARACT EXTRACTION W/PHACO Left 10/24/2017   Procedure: CATARACT EXTRACTION PHACO AND INTRAOCULAR LENS PLACEMENT ([place]);  Surgeon: Birder Robson, MD;  Location: [facility] ORS;  Service: Ophthalmology;  Laterality: Left;  [place] 00:34.3 AP% 15.8 CDE 5.41 Fluid Pack lot # [id number]   CATARACT EXTRACTION W/PHACO Right 11/28/2017   Procedure: CATARACT EXTRACTION PHACO AND INTRAOCULAR LENS PLACEMENT (IOC);  Surgeon: Birder Robson, MD;  Location: [facility] ORS;  Service: Ophthalmology;  Laterality: Right;  [place] 00:32 AP% 11.3 CDE 3.66 Fluid pack lot # [id number]   CORONARY ANGIOPLASTY     STENT   EXTRACORPOREAL SHOCK WAVE LITHOTRIPSY Left 12/03/2015   Procedure: EXTRACORPOREAL SHOCK WAVE LITHOTRIPSY (ESWL);  Surgeon: Hollice Espy, MD;  Location: [facility] ORS;  Service: Urology;  Laterality: Left;    SOCIAL HISTORY:   Social History   Tobacco Use   Smoking status: Never Smoker   Smokeless tobacco: Current User    Types: Chew  Substance Use Topics   Alcohol use: Yes    Comment: OCCAS    FAMILY HISTORY:  History reviewed. No pertinent family history.  DRUG ALLERGIES:   Allergies  Allergen Reactions   Penicillins Rash and Other (See Comments)    Has patient had a PCN reaction causing immediate rash, facial/tongue/throat swelling, SOB or lightheadedness with hypotension: No Has patient had a PCN reaction causing severe rash involving mucus membranes or skin necrosis:  No Has patient had a PCN reaction that required hospitalization No Has patient had a PCN reaction occurring within the last 10 years: No If all of the above answers are "NO", then may proceed with Cephalosporin use.    REVIEW OF SYSTEMS:   Review of Systems  Constitutional: Negative for chills, fever and malaise/fatigue.  HENT: Negative for congestion, sinus pain and sore throat.   Eyes: Negative for blurred vision and double vision.  Respiratory: Positive for shortness of  breath. Negative for cough, sputum production and wheezing.   Cardiovascular: Positive for chest pain. Negative for palpitations and leg swelling.  Gastrointestinal: Positive for heartburn and nausea. Negative for abdominal pain, blood in stool and vomiting.  Genitourinary: Negative for dysuria, flank pain, frequency and hematuria.  Musculoskeletal: Negative for falls, joint pain and myalgias.  Neurological: Negative for dizziness, tingling, tremors, focal weakness, seizures, loss of consciousness, weakness and headaches.  Psychiatric/Behavioral: Negative.      MEDICATIONS AT HOME:   Prior to Admission medications   Medication Sig Start Date End Date Taking? Authorizing Provider  amLODipine (NORVASC) 5 MG tablet Take 5 mg by mouth at bedtime.    [provider]  aspirin EC 81 MG tablet Take 81 mg by mouth daily.    [provider]  atorvastatin (LIPITOR) 20 MG tablet Take 20 mg by mouth at bedtime.    [provider]  B Complex Vitamins (VITAMIN B COMPLEX PO) Take 1 tablet by mouth daily.  08/10/15   [provider]  clopidogrel (PLAVIX) 75 MG tablet Take 75 mg by mouth daily.    [provider]  Denture Care Products (DENTURE ADHESIVE) CREA Apply 1 application topically daily as needed.  08/10/15   [provider]  diphenhydrAMINE (EQ ALLERGY) 25 mg capsule Take 25 mg by mouth every 6 (six) hours as needed for allergies.    [provider]  docusate sodium (COLACE) 100 MG capsule Take 1 capsule (100 mg total) by mouth 2 (two) times daily. 12/03/15   Hollice Espy, MD  hydrochlorothiazide (MICROZIDE) 12.5 MG capsule Take 1 capsule (12.5 mg total) by mouth daily. Patient not taking: Reported on 10/18/2017 02/20/15   Joanne Gavel, MD  magnesium gluconate (MAGONATE) 500 MG tablet Take 500 mg by mouth daily.    [provider]  meloxicam (MOBIC) 15 MG tablet Take 15 mg by mouth at bedtime.    [provider]    Multiple Vitamins-Minerals (BL CENTURY SENIOR PO) Take 1 tablet by mouth daily.    [provider]  nitroGLYCERIN (NITROSTAT) 0.4 MG SL tablet Place 0.4 mg under the tongue every 5 (five) minutes as needed for chest pain. Reported on 11/13/2015    [provider]  Omega-3 Fatty Acids (FISH OIL) 1000 MG CAPS Take 1,000 mg by mouth daily.    [provider]  omeprazole (PRILOSEC) 20 MG capsule Take 20 mg by mouth 2 (two) times daily.     [provider]  tamsulosin (FLOMAX) 0.4 MG CAPS capsule Take 1 capsule (0.4 mg total) by mouth daily. 12/03/15   Hollice Espy, MD  traZODone (DESYREL) 50 MG tablet Take 50 mg by mouth at bedtime.  01/01/16   [provider]  venlafaxine XR (EFFEXOR-XR) 37.5 MG 24 hr capsule Take 37.5 mg by mouth daily. 09/04/17   [provider]      VITAL SIGNS:  Blood pressure 132/73, pulse 76, temperature 97.9 F (36.6 C), temperature source Oral, resp. rate 15, height  5\' 8"  (1.727 m), weight 78.9 kg, SpO2 99 %.  PHYSICAL EXAMINATION:  Physical Exam Constitutional:      Appearance: Normal appearance.  HENT:     Head: Normocephalic and atraumatic.     Right Ear: External ear normal.     Left Ear: External ear normal.     Nose: Nose normal.     Mouth/Throat:     Pharynx: Oropharynx is clear.  Eyes:     General: No scleral icterus.    Conjunctiva/sclera: Conjunctivae normal.     Pupils: Pupils are equal, round, and reactive to light.  Neck:     Musculoskeletal: Normal range of motion and neck supple.  Cardiovascular:     Rate and Rhythm: Normal rate and regular rhythm.     Pulses: Normal pulses.     Heart sounds: Normal heart sounds. No murmur. No friction rub. No gallop.   Pulmonary:     Effort: Pulmonary effort is normal. No respiratory distress.     Breath sounds: Normal breath sounds. No stridor. No wheezing, rhonchi or rales.  Abdominal:     General: Abdomen is flat. Bowel sounds are normal. There is no  distension.     Palpations: Abdomen is soft.     Tenderness: There is no abdominal tenderness.  Musculoskeletal: Normal range of motion.        General: No tenderness.     Right lower leg: No edema.     Left lower leg: No edema.  Skin:    General: Skin is warm and dry.     Capillary Refill: Capillary refill takes less than 2 seconds.     Findings: No rash.     Comments: Right groin pressure dressing  Neurological:     General: No focal deficit present.     Mental Status: He is alert and oriented to person, place, and time.  Psychiatric:        Mood and Affect: Mood normal.        Behavior: Behavior normal.       LABORATORY PANEL:   CBC Recent Labs  Lab 10/17/18 1604  WBC 20.4*  HGB 16.0  HCT 48.6  PLT 254   ------------------------------------------------------------------------------------------------------------------  Chemistries  Recent Labs  Lab 10/17/18 1604  NA 142  K 4.5  CL 102  CO2 24  GLUCOSE 97  BUN 23  CREATININE 1.35*  CALCIUM 9.7  AST 32  ALT 39  ALKPHOS 59  BILITOT 0.6   ------------------------------------------------------------------------------------------------------------------  Cardiac Enzymes Recent Labs  Lab 10/17/18 1828  TROPONINI 0.04*   ------------------------------------------------------------------------------------------------------------------  RADIOLOGY:  Dg Chest Port 1 View  Result Date: 10/17/2018 CLINICAL DATA:  Chest pain, STEMI, coronary artery disease post MI, hypertension EXAM: PORTABLE CHEST 1 VIEW COMPARISON:  Portable exam 1600 hours compared to 02/20/2015 FINDINGS: External pacing leads project over chest. Normal heart size, mediastinal contours, and pulmonary vascularity. Atherosclerotic calcification aorta. Minimal RIGHT basilar atelectasis. Lungs otherwise clear. No infiltrate, pleural effusion or pneumothorax. Prior resection versus resorption of distal LEFT clavicle. No acute osseous findings.  IMPRESSION: Mild RIGHT basilar atelectasis. Electronically Signed   By: Lavonia Dana M.D.   On: 10/17/2018 16:32      IMPRESSION AND PLAN:   1. STEMI - Patient is status post cardiac catheterization with drug-eluting stent placement to the RCA - Brilinta and aspirin to be resumed for at least 12 months according to cardiology - Lipitor 80 mg once a day - Beta-blocker initiated - Telemetry monitoring - Repeat BMP and  CBC in the a.m. -Lipid panel pending  2. hypertension - Norvasc continued - We will treat persistent hypertension expectantly -Telemetry monitoring  3.  Acute renal failure - With creatinine 1.35.  Will repeat BMP in the a.m. - Consider nephrology consultation if no improvement in renal function or if worse  4. GERD - PPI therapy continued  5. hyperlipidemia - Lipid panel -Statin therapy continued  6.  Depression - Effexor continued  DVT and PPI prophylaxis initiated   All the records are reviewed and case discussed with ED provider. The plan of care was discussed in details with the patient (and family). I answered all questions. The patient agreed to proceed with the above mentioned plan. Further management will depend upon hospital course.   CODE STATUS: Full code  TOTAL TIME TAKING CARE OF THIS PATIENT:45 minutes.    [place] on 10/17/2018 at 7:57 PM  Pager - [phone]  After 6pm go to [email or web address] - Proofreader  [facility]  Office  [phone]  CC: Primary care physician; Rusty Aus, MD   Note: This dictation was prepared with Dragon dictation along with smaller phrase technology. Any transcriptional errors that result from this process are unintentional.

## 2018-10-17 NOTE — Consult Note (Signed)
Reason for Consult: STEMI inferior wall Referring Physician: Emily Wilson primary Cardiologist Dr. [name] is an 85 y.o. male.  HPI: Patient presents with several week history of stuttering chest pains suggestive of unstable angina patient also underwent outpatient regular stress test which showed ST elevation inferiorly so patient was then transferred directly to the emergency room for admission evaluation for possible STEMI.. Patient had severe pain initially around 8 over 10 x 10 the patient came to emergency room given nitroglycerin aspirin and subsequently heparin pain was reduced to about a 1/10 he was hemodynamically stable minimal shortness of breath.  Patient has history of previous coronary disease previous PCI and stent of RCA about 10 years ago by Dr Patrick Wilson.  Patient had been compliant with his hypertension medication recently lost his wife about 3 months ago to a long standing illness patient now lives alone and has had nightly chest discomfort which kept him from sleeping he was being worked up and evaluated when he had obviously positive functional study today  Past Medical History:  Diagnosis Date  . Cancer ([place])    SKIN  . Coronary artery disease   . Depression   . GERD (gastroesophageal reflux disease)   . History of kidney stones   . Hypertension   . Myocardial infarction [facility])    2012    Past Surgical History:  Procedure Laterality Date  . cardiac stents    . CATARACT EXTRACTION W/PHACO Left 10/24/2017   Procedure: CATARACT EXTRACTION PHACO AND INTRAOCULAR LENS PLACEMENT (IOC);  Surgeon: Patrick Robson, MD;  Location: [facility] ORS;  Service: Ophthalmology;  Laterality: Left;  [place] 00:34.3 AP% 15.8 CDE 5.41 Fluid Pack lot # [id number]  . CATARACT EXTRACTION W/PHACO Right 11/28/2017   Procedure: CATARACT EXTRACTION PHACO AND INTRAOCULAR LENS PLACEMENT (IOC);  Surgeon: Patrick Robson, MD;  Location: [facility] ORS;  Service: Ophthalmology;  Laterality: Right;   [place] 00:32 AP% 11.3 CDE 3.66 Fluid pack lot # [id number]  . CORONARY ANGIOPLASTY     STENT  . EXTRACORPOREAL SHOCK WAVE LITHOTRIPSY Left 12/03/2015   Procedure: EXTRACORPOREAL SHOCK WAVE LITHOTRIPSY (ESWL);  Surgeon: Patrick Espy, MD;  Location: [facility] ORS;  Service: Urology;  Laterality: Left;    History reviewed. No pertinent family history.  Social History:  reports that he has never smoked. His smokeless tobacco use includes chew. He reports current alcohol use. He reports previous drug use.  Allergies:  Allergies  Allergen Reactions  . Penicillins Rash and Other (See Comments)    Has patient had a PCN reaction causing immediate rash, facial/tongue/throat swelling, SOB or lightheadedness with hypotension: No Has patient had a PCN reaction causing severe rash involving mucus membranes or skin necrosis: No Has patient had a PCN reaction that required hospitalization No Has patient had a PCN reaction occurring within the last 10 years: No If all of the above answers are "NO", then may proceed with Cephalosporin use.    Medications: I have reviewed the patient's current medications.  Results for orders placed or performed during the hospital encounter of 10/17/18 (from the past 48 hour(s))  SARS Coronavirus 2 (CEPHEID - Performed in [facility] hospital lab), Hosp Order     Status: None   Collection Time: 10/17/18  4:04 PM   Specimen: Nasopharyngeal Swab  Result Value Ref Range   SARS Coronavirus 2 NEGATIVE NEGATIVE    Comment: (NOTE) If result is NEGATIVE SARS-CoV-2 target nucleic acids are NOT DETECTED. The SARS-CoV-2 RNA is generally detectable in upper and lower  respiratory specimens during the acute phase of infection. The lowest  concentration of SARS-CoV-2 viral copies this assay can detect is 250  copies / mL. A negative result does not preclude SARS-CoV-2 infection  and should not be used as the sole basis for treatment or other  patient management decisions.  A negative  result may occur with  improper specimen collection / handling, submission of specimen other  than nasopharyngeal swab, presence of viral mutation(s) within the  Wilson targeted by this assay, and inadequate number of viral copies  (<250 copies / mL). A negative result must be combined with clinical  observations, patient history, and epidemiological information. If result is POSITIVE SARS-CoV-2 target nucleic acids are DETECTED. The SARS-CoV-2 RNA is generally detectable in upper and lower  respiratory specimens dur ing the acute phase of infection.  Positive  results are indicative of active infection with SARS-CoV-2.  Clinical  correlation with patient history and other diagnostic information is  necessary to determine patient infection status.  Positive results do  not rule out bacterial infection or co-infection with other viruses. If result is PRESUMPTIVE POSTIVE SARS-CoV-2 nucleic acids MAY BE PRESENT.   A presumptive positive result was obtained on the submitted specimen  and confirmed on repeat testing.  While 2019 novel coronavirus  (SARS-CoV-2) nucleic acids may be present in the submitted sample  additional confirmatory testing may be necessary for epidemiological  and / or clinical management purposes  to differentiate between  SARS-CoV-2 and other Sarbecovirus currently known to infect humans.  If clinically indicated additional testing with an alternate test  methodology [phone]) is advised. The SARS-CoV-2 RNA is generally  detectable in upper and lower respiratory sp ecimens during the acute  phase of infection. The expected result is Negative. Fact Sheet for Patients:  StrictlyIdeas.no Fact Sheet for Healthcare Providers: BankingDealers.co.za This test is not yet approved or cleared by the [place] FDA and has been authorized for detection and/or diagnosis of SARS-CoV-2 by FDA under an Emergency Use Authorization  (EUA).  This EUA will remain in effect (meaning this test can be used) for the duration of the COVID-19 declaration under Section 564(b)(1) of the Act, 21 U.S.C. section 360bbb-3(b)(1), unless the authorization is terminated or revoked sooner. Performed at [facility], [address] [place], [place] [address]   CBC with Differential/Platelet     Status: Abnormal   Collection Time: 10/17/18  4:04 PM  Result Value Ref Range   WBC 20.4 (H) 4.0 - 10.5 K/uL   RBC 5.07 4.22 - 5.81 MIL/uL   Hemoglobin 16.0 13.0 - 17.0 g/dL   HCT 48.6 39.0 - 52.0 %   MCV 95.9 80.0 - 100.0 fL   MCH 31.6 26.0 - 34.0 pg   MCHC 32.9 30.0 - 36.0 g/dL   RDW 13.4 11.5 - 15.5 %   Platelets 254 150 - 400 K/uL   nRBC 0.0 0.0 - 0.2 %   Neutrophils Relative % 73 %   Neutro Abs 14.9 (H) 1.7 - 7.7 K/uL   Lymphocytes Relative 16 %   Lymphs Abs 3.2 0.7 - 4.0 K/uL   Monocytes Relative 10 %   Monocytes Absolute 2.0 (H) 0.1 - 1.0 K/uL   Eosinophils Relative 0 %   Eosinophils Absolute 0.0 0.0 - 0.5 K/uL   Basophils Relative 0 %   Basophils Absolute 0.1 0.0 - 0.1 K/uL   Immature Granulocytes 1 %   Abs Immature Granulocytes 0.29 (H) 0.00 - 0.07 K/uL  Comment: Performed at [facility], [address] [place], [place] [address]  Protime-INR     Status: None   Collection Time: 10/17/18  4:04 PM  Result Value Ref Range   Prothrombin Time 12.9 11.4 - 15.2 seconds   INR 1.0 0.8 - 1.2    Comment: (NOTE) INR goal varies based on device and disease states. Performed at [facility], [address] [place], [place] [address]   APTT     Status: None   Collection Time: 10/17/18  4:04 PM  Result Value Ref Range   aPTT 25 24 - 36 seconds    Comment: Performed at [facility], [address] [place], [place] [address]  Comprehensive metabolic panel     Status: Abnormal   Collection Time: 10/17/18  4:04 PM  Result Value Ref Range   Sodium 142 135 - 145 mmol/L   Potassium 4.5  3.5 - 5.1 mmol/L   Chloride 102 98 - 111 mmol/L   CO2 24 22 - 32 mmol/L   Glucose, Bld 97 70 - 99 mg/dL   BUN 23 8 - 23 mg/dL   Creatinine, Ser 1.35 (H) 0.61 - 1.24 mg/dL   Calcium 9.7 8.9 - 10.3 mg/dL   Total Protein 6.9 6.5 - 8.1 g/dL   Albumin 4.1 3.5 - 5.0 g/dL   AST 32 15 - 41 U/L   ALT 39 0 - 44 U/L   Alkaline Phosphatase 59 38 - 126 U/L   Total Bilirubin 0.6 0.3 - 1.2 mg/dL   GFR calc non Af Amer 48 (L) >60 mL/min   GFR calc Af Amer 56 (L) >60 mL/min   Anion gap 16 (H) 5 - 15    Comment: Performed at [facility], [address] [place], [place] [address]  Troponin I - ONCE - STAT     Status: Abnormal   Collection Time: 10/17/18  4:04 PM  Result Value Ref Range   Troponin I 0.03 (HH) <0.03 ng/mL    Comment: CRITICAL RESULT CALLED TO, READ BACK BY AND VERIFIED WITH SANDY EDDINS @1646  10/17/18 MJU Performed at [facility], [address] [place],  [address]   Lipid panel     Status: None   Collection Time: 10/17/18  4:04 PM  Result Value Ref Range   Cholesterol 138 0 - 200 mg/dL   Triglycerides 101 <150 mg/dL   HDL 64 >40 mg/dL   Total CHOL/HDL Ratio 2.2 RATIO   VLDL 20 0 - 40 mg/dL   LDL Cholesterol 54 0 - 99 mg/dL    Comment:        Total Cholesterol/HDL:CHD Risk Coronary Heart Disease Risk Table                     Men   Women  1/2 Average Risk   3.4   3.3  Average Risk       5.0   4.4  2 X Average Risk   9.6   7.1  3 X Average Risk  23.4   11.0        Use the calculated Patient Ratio above and the CHD Risk Table to determine the patient's CHD Risk.        ATP III CLASSIFICATION (LDL):  <100     mg/dL   Optimal  100-129  mg/dL   Near or Above  Optimal  130-159  mg/dL   Borderline  160-189  mg/dL   High  >190     mg/dL   Very High Performed at [facility], [address] [place], [place] [address]   POCT Activated clotting time     Status: None   Collection Time: 10/17/18  4:49 PM  Result Value  Ref Range   Activated Clotting Time 411 seconds    Dg Chest Port 1 View  Result Date: 10/17/2018 CLINICAL DATA:  Chest pain, STEMI, coronary artery disease post MI, hypertension EXAM: PORTABLE CHEST 1 VIEW COMPARISON:  Portable exam 1600 hours compared to 02/20/2015 FINDINGS: External pacing leads project over chest. Normal heart size, mediastinal contours, and pulmonary vascularity. Atherosclerotic calcification aorta. Minimal RIGHT basilar atelectasis. Lungs otherwise clear. No infiltrate, pleural effusion or pneumothorax. Prior resection versus resorption of distal LEFT clavicle. No acute osseous findings. IMPRESSION: Mild RIGHT basilar atelectasis. Electronically Signed   By: Lavonia Dana M.D.   On: 10/17/2018 16:32    Review of Systems  Constitutional: Positive for malaise/fatigue.  HENT: Positive for congestion.   Eyes: Negative.   Respiratory: Positive for shortness of breath.   Cardiovascular: Positive for chest pain, orthopnea and PND.  Gastrointestinal: Positive for abdominal pain and heartburn.  Genitourinary: Negative.   Musculoskeletal: Positive for myalgias.  Skin: Negative.   Neurological: Positive for dizziness and weakness.  Endo/Heme/Allergies: Negative.   Psychiatric/Behavioral: The patient has insomnia.    Blood pressure (!) 129/96, pulse 92, temperature (!) 97.4 F (36.3 C), temperature source Axillary, resp. rate 14, height 5\' 8"  (1.727 m), weight 76.2 kg, SpO2 96 %. Physical Exam  Nursing note and vitals reviewed. Constitutional: He is oriented to person, place, and time. He appears well-developed and well-nourished.  HENT:  Head: Normocephalic and atraumatic.  Eyes: Pupils are equal, round, and reactive to light. Conjunctivae and EOM are normal.  Neck: Normal range of motion. Neck supple.  Cardiovascular: Normal rate and regular rhythm.  Murmur heard. Respiratory: He is in respiratory distress.  GI: Soft. Bowel sounds are normal.  Musculoskeletal: Normal  range of motion.  Neurological: He is alert and oriented to person, place, and time. He has normal reflexes.  Skin: Skin is warm and dry.  Psychiatric: He has a normal mood and affect.    Assessment/Plan: STEMI inferior wall Hypertension Hyperlipidemia Abnormal EKG GERD Coronary artery disease History of PCI and stent Depression . Plan Agree with admit rule out for myocardial infarction Proceed directly to cardiac Cath Lab for intention to treat for STEMI Continue anticoagulation start with heparin switched to Angiomax during the procedure We will maintain aspirin and Brilinta at least for 12 months Recommend high-dose statin therapy Lipitor 80 mg once a day Recommend beta-blocker therapy with metoprolol [facility] ACE inhibitor or ARB Consider discontinuing amlodipine Follow-up renal insufficiency consider nephrology input Recommend reflux therapy with omeprazole Follow-up cardiac enzymes and EKG Consult hospitalist for medical therapy  Loran Senters [place] 10/17/2018, 5:54 PM

## 2018-10-17 NOTE — ED Notes (Signed)
CODE  STEMI C ALLED  TO  CARELINK ([place])

## 2018-10-17 NOTE — Consult Note (Signed)
Name: Patrick Wilson MRN: [id number] DOB: 1933-11-20    ADMISSION DATE:  10/17/2018 CONSULTATION DATE: 10/17/2018 REFERRING MD : Dr. Clayborn Bigness   CHIEF COMPLAINT: Chest Pain   BRIEF PATIENT DESCRIPTION:  85 yo male admitted with inferior STEMI secondary to 99% occlusion of the ost RCA to proximal RCA requiring drug eluting stent placement   SIGNIFICANT EVENTS/STUDIES:  06/17-Cardiac cath revealed Left ventriculogram suggested preserved overall left ventricular function around 55% with borderline inferior hypo-Left coronary arteries appeared to be relatively free of disease except for moderate coronary disease Left main with minor irregularities LAD was medium with just a 50% diagonal diagonal 1 distal LAD was 50% Circumflex was medium in size and relatively free of disease RCA was large and moderately calcified and appeared to be the infarct- related artery Patient appeared to have a 99% proximal RCA hazy still with TIMI-3 flow Right dominant system Successful PCI and stent of proximal RCA with DES reducing lesion from 99 down to 0% Minx was deployed 06/17-Pt admitted to ICU post cardiac catheterization   HISTORY OF PRESENT ILLNESS:   This is an 85 yo male with a PMH of Myocardial Infarction (2012 requiring stent placement), HTN, GERD, Kidney Stones, Depression, CAD, and Skin Cancer.  He presented to [facility] ER on 06/17 from [facility] as a Code STEMI during outpatient scheduled stress test.  Per ER notes pt reported intermittent chest pressure onset several weeks prior to presentation which he initially thought was indigestion.  Due to symptoms he saw his PCP on 10/15/2018 and CTA Chest ordered results were negative for PE and revealed no acute abnormality in the chest, however revealed moderate to advanced coronary calcification and aortic atherosclerosis.  EKG during outpatient office visit revealed NSR without ischemic changes, however despite adjustments to medications pts intermittent chest  pain persisted and stress test ordered.  During current ER visit pt reported during stress test he started walking and developed acute onset of severe chest discomfort, shortness of breath, and nausea.  EKG revealed inferior ST elevation, therefore pt received nitroglycerin and full dose aspirin with improvement of symptoms and pt transported to the ER.  EKG in the ER revealed ST segment elevation in inferior leads with reciprocal lateral changes.  Pt transported emergently to cardiac cath lab. Cardiac cath revealed 99% occlusion of the ost RCA to proximal RCA requiring drug eluting stent placement.  He was subsequently admitted to ICU post cardiac cath.   PAST MEDICAL HISTORY :   has a past medical history of Cancer ([place]), Coronary artery disease, Depression, GERD (gastroesophageal reflux disease), History of kidney stones, Hypertension, and Myocardial infarction ([place]).  has a past surgical history that includes cardiac stents; Extracorporeal shock wave lithotripsy (Left, 12/03/2015); Coronary angioplasty; Cataract extraction w/PHACO (Left, 10/24/2017); and Cataract extraction w/PHACO (Right, 11/28/2017). Prior to Admission medications   Medication Sig Start Date End Date Taking? Authorizing Provider  amLODipine (NORVASC) 5 MG tablet Take 5 mg by mouth at bedtime.    [provider]  aspirin EC 81 MG tablet Take 81 mg by mouth daily.    [provider]  atorvastatin (LIPITOR) 20 MG tablet Take 20 mg by mouth at bedtime.    [provider]  B Complex Vitamins (VITAMIN B COMPLEX PO) Take 1 tablet by mouth daily.  08/10/15   [provider]  clopidogrel (PLAVIX) 75 MG tablet Take 75 mg by mouth daily.    [provider]  Denture Care Products (DENTURE ADHESIVE) CREA Apply 1 application  topically daily as needed.  08/10/15   [provider]  diphenhydrAMINE (EQ ALLERGY) 25 mg capsule Take 25 mg by mouth every 6 (six) hours as needed for allergies.     [provider]  docusate sodium (COLACE) 100 MG capsule Take 1 capsule (100 mg total) by mouth 2 (two) times daily. 12/03/15   Hollice Espy, MD  hydrochlorothiazide (MICROZIDE) 12.5 MG capsule Take 1 capsule (12.5 mg total) by mouth daily. Patient not taking: Reported on 10/18/2017 02/20/15   Joanne Gavel, MD  magnesium gluconate (MAGONATE) 500 MG tablet Take 500 mg by mouth daily.    [provider]  meloxicam (MOBIC) 15 MG tablet Take 15 mg by mouth at bedtime.    [provider]  Multiple Vitamins-Minerals (BL CENTURY SENIOR PO) Take 1 tablet by mouth daily.    [provider]  nitroGLYCERIN (NITROSTAT) 0.4 MG SL tablet Place 0.4 mg under the tongue every 5 (five) minutes as needed for chest pain. Reported on 11/13/2015    [provider]  Omega-3 Fatty Acids (FISH OIL) 1000 MG CAPS Take 1,000 mg by mouth daily.    [provider]  omeprazole (PRILOSEC) 20 MG capsule Take 20 mg by mouth 2 (two) times daily.     [provider]  tamsulosin (FLOMAX) 0.4 MG CAPS capsule Take 1 capsule (0.4 mg total) by mouth daily. 12/03/15   Hollice Espy, MD  traZODone (DESYREL) 50 MG tablet Take 50 mg by mouth at bedtime.  01/01/16   [provider]  venlafaxine XR (EFFEXOR-XR) 37.5 MG 24 hr capsule Take 37.5 mg by mouth daily. 09/04/17   [provider]   Allergies  Allergen Reactions   Penicillins Rash and Other (See Comments)    Has patient had a PCN reaction causing immediate rash, facial/tongue/throat swelling, SOB or lightheadedness with hypotension: No Has patient had a PCN reaction causing severe rash involving mucus membranes or skin necrosis: No Has patient had a PCN reaction that required hospitalization No Has patient had a PCN reaction occurring within the last 10 years: No If all of the above answers are "NO", then may proceed with Cephalosporin use.    FAMILY HISTORY:  family history is not on file. SOCIAL  HISTORY:  reports that he has never smoked. His smokeless tobacco use includes chew. He reports current alcohol use. He reports previous drug use.  REVIEW OF SYSTEMS: Positives in BOLD   Constitutional: Negative for fever, chills, weight loss, malaise/fatigue and diaphoresis.  HENT: Negative for hearing loss, ear pain, nosebleeds, congestion, sore throat, neck pain, tinnitus and ear discharge.   Eyes: Negative for blurred vision, double vision, photophobia, pain, discharge and redness.  Respiratory: cough, hemoptysis, sputum production, shortness of breath, wheezing and stridor.   Cardiovascular: chest pain, palpitations, orthopnea, claudication, leg swelling and PND.  Gastrointestinal: heartburn, nausea, vomiting, abdominal pain, diarrhea, constipation, blood in stool and melena.  Genitourinary: Negative for dysuria, urgency, frequency, hematuria and flank pain.  Musculoskeletal: Negative for myalgias, back pain, joint pain and falls.  Skin: Negative for itching and rash.  Neurological: Negative for dizziness, tingling, tremors, sensory change, speech change, focal weakness, seizures, loss of consciousness, weakness and headaches.  Endo/Heme/Allergies: Negative for environmental allergies and polydipsia. Does not bruise/bleed easily.  SUBJECTIVE:  No complaints at this time.   VITAL SIGNS: Temp:  [97.4 F (36.3 C)] 97.4 F (36.3 C) (06/17 1607) Pulse Rate:  [92-102] 92 (06/17 1618) Resp:  [14-17] 14 (06/17 1618) BP: (129)/(96) 129/96 (  06/17 1607) SpO2:  [96 %-100 %] 96 % (06/17 1636) Weight:  [76.2 kg] 76.2 kg (06/17 1557)  PHYSICAL EXAMINATION: General: well developed, well nourished male, NAD  Neuro: alert and oriented, follows commands  HEENT: supple, no JVD  Cardiovascular: nsr, rrr, no R/G Lungs: clear throughout, even, non labored  Abdomen: +BS x4, soft, non tender, non distended  Musculoskeletal: normal bulk and tone, no edema  Skin: right groin cardiac cath site clear of  hematoma or bleeding   Recent Labs  Lab 10/17/18 1604  NA 142  K 4.5  CL 102  CO2 24  BUN 23  CREATININE 1.35*  GLUCOSE 97   Recent Labs  Lab 10/17/18 1604  HGB 16.0  HCT 48.6  WBC 20.4*  PLT 254   Dg Chest Port 1 View  Result Date: 10/17/2018 CLINICAL DATA:  Chest pain, STEMI, coronary artery disease post MI, hypertension EXAM: PORTABLE CHEST 1 VIEW COMPARISON:  Portable exam 1600 hours compared to 02/20/2015 FINDINGS: External pacing leads project over chest. Normal heart size, mediastinal contours, and pulmonary vascularity. Atherosclerotic calcification aorta. Minimal RIGHT basilar atelectasis. Lungs otherwise clear. No infiltrate, pleural effusion or pneumothorax. Prior resection versus resorption of distal LEFT clavicle. No acute osseous findings. IMPRESSION: Mild RIGHT basilar atelectasis. Electronically Signed   By: Lavonia Blen Ransome M.D.   On: 10/17/2018 16:32    ASSESSMENT / PLAN:  Inferior STEMI secondary to occlusion of proximal RCA s/p drug eluting stent placement  Hx: HTN, CAD, and MI  Prn supplemental O2 for dyspnea and/or hypoxia  Continuous telemetry monitoring  Trend troponin's Cardiology consulted appreciate input-cardiac medications per recommendations  Continue angiomax gtt per cardiology recs Trend CBC  Monitor for s/sx of bleeding and transfuse for hgb <8  Mild acute renal failure  Continue post cath NS @76 .2 ml/hr for 10 hrs  Trend BMP Replace electrolytes as indicated Monitor UOP Avoid nephrotoxic medications   GERD  Continue protonix   Depression  Continue outpatient venlafaxine xr  COVID-19 negative   Marda Stalker, [place] Pager [phone] (please enter 7 digits) PCCM Consult Pager [phone] (please enter 7 digits)

## 2018-10-17 NOTE — ED Provider Notes (Signed)
[facility] Emergency Department Provider Note  ____________________________________________   First MD Initiated Contact with Patient 10/17/18 1600     (approximate)  I have reviewed the triage vital signs and the nursing notes.   HISTORY  Chief Complaint Code STEMI    HPI Patrick Wilson is a 85 y.o. male with past medical history as below including with medical history of myocardial infarction in 2012 status post stenting here with chest pain.  The patient has reportedly been having intermittent chest pressure which he thought was indigestion for the last several weeks.  He has seen his doctor and had a CT angios as well as previous EKGs and lab work.  He was having a stress test done today.  He started walking, then developed acute onset of severe, aching, burning, pressure-like chest pressure.  He felt short of breath.  He had some nausea with it.  No syncope.  EKG was continuous and showed inferior ST elevation MI.  He was given a full dose of aspirin, nitroglycerin with improvement, then sent here.  No alleviating factors of nitroglycerin.  Symptoms seem worse with exertion.  No vomiting.  No other medical complaints currently.        Past Medical History:  Diagnosis Date  . Cancer ([place])    SKIN  . Coronary artery disease   . Depression   . GERD (gastroesophageal reflux disease)   . History of kidney stones   . Hypertension   . Myocardial infarction [facility])    2012    Patient Active Problem List   Diagnosis Date Noted  . Ureteral calculus 11/24/2015  . Arterial vascular disease 11/13/2015  . Calculus of kidney 11/13/2015  . Cervical disc disease 03/05/2014  . Combined fat and carbohydrate induced hyperlipemia 03/05/2014  . Essential (primary) hypertension 03/05/2014  . History of cardiac catheterization 01/02/2014  . ED (erectile dysfunction) of organic origin 03/04/2013  . H/O urinary stone 03/04/2013    Past Surgical History:  Procedure  Laterality Date  . cardiac stents    . CATARACT EXTRACTION W/PHACO Left 10/24/2017   Procedure: CATARACT EXTRACTION PHACO AND INTRAOCULAR LENS PLACEMENT (IOC);  Surgeon: Birder Robson, MD;  Location: [facility] ORS;  Service: Ophthalmology;  Laterality: Left;  [place] 00:34.3 AP% 15.8 CDE 5.41 Fluid Pack lot # [id number]  . CATARACT EXTRACTION W/PHACO Right 11/28/2017   Procedure: CATARACT EXTRACTION PHACO AND INTRAOCULAR LENS PLACEMENT (IOC);  Surgeon: Birder Robson, MD;  Location: [facility] ORS;  Service: Ophthalmology;  Laterality: Right;  [place] 00:32 AP% 11.3 CDE 3.66 Fluid pack lot # [id number]  . CORONARY ANGIOPLASTY     STENT  . EXTRACORPOREAL SHOCK WAVE LITHOTRIPSY Left 12/03/2015   Procedure: EXTRACORPOREAL SHOCK WAVE LITHOTRIPSY (ESWL);  Surgeon: Hollice Espy, MD;  Location: [facility] ORS;  Service: Urology;  Laterality: Left;    Prior to Admission medications   Medication Sig Start Date End Date Taking? Authorizing Provider  amLODipine (NORVASC) 5 MG tablet Take 5 mg by mouth at bedtime.    [provider]  aspirin EC 81 MG tablet Take 81 mg by mouth daily.    [provider]  atorvastatin (LIPITOR) 20 MG tablet Take 20 mg by mouth at bedtime.    [provider]  B Complex Vitamins (VITAMIN B COMPLEX PO) Take 1 tablet by mouth daily.  08/10/15   [provider]  clopidogrel (PLAVIX) 75 MG tablet Take 75 mg by mouth daily.    [provider]  [place] (DENTURE  ADHESIVE) CREA Apply 1 application topically daily as needed.  08/10/15   [provider]  diphenhydrAMINE (EQ ALLERGY) 25 mg capsule Take 25 mg by mouth every 6 (six) hours as needed for allergies.    [provider]  docusate sodium (COLACE) 100 MG capsule Take 1 capsule (100 mg total) by mouth 2 (two) times daily. 12/03/15   Hollice Espy, MD  hydrochlorothiazide (MICROZIDE) 12.5 MG capsule Take 1 capsule (12.5 mg total) by mouth daily. Patient not taking: Reported on  10/18/2017 02/20/15   Joanne Gavel, MD  magnesium gluconate (MAGONATE) 500 MG tablet Take 500 mg by mouth daily.    [provider]  meloxicam (MOBIC) 15 MG tablet Take 15 mg by mouth at bedtime.    [provider]  Multiple Vitamins-Minerals (BL CENTURY SENIOR PO) Take 1 tablet by mouth daily.    [provider]  nitroGLYCERIN (NITROSTAT) 0.4 MG SL tablet Place 0.4 mg under the tongue every 5 (five) minutes as needed for chest pain. Reported on 11/13/2015    [provider]  Omega-3 Fatty Acids (FISH OIL) 1000 MG CAPS Take 1,000 mg by mouth daily.    [provider]  omeprazole (PRILOSEC) 20 MG capsule Take 20 mg by mouth 2 (two) times daily.     [provider]  tamsulosin (FLOMAX) 0.4 MG CAPS capsule Take 1 capsule (0.4 mg total) by mouth daily. 12/03/15   Hollice Espy, MD  traZODone (DESYREL) 50 MG tablet Take 50 mg by mouth at bedtime.  01/01/16   [provider]  venlafaxine XR (EFFEXOR-XR) 37.5 MG 24 hr capsule Take 37.5 mg by mouth daily. 09/04/17   [provider]    Allergies Penicillins  History reviewed. No pertinent family history.  Social History Social History   Tobacco Use  . Smoking status: Never Smoker  . Smokeless tobacco: Current User    Types: Chew  Substance Use Topics  . Alcohol use: Yes    Comment: OCCAS  . Drug use: Not Currently    Review of Systems  Review of Systems  Constitutional: Positive for fatigue. Negative for chills and fever.  HENT: Negative for congestion and rhinorrhea.   Eyes: Negative for visual disturbance.  Respiratory: Positive for chest tightness and shortness of breath. Negative for cough.   Cardiovascular: Positive for chest pain.  Gastrointestinal: Positive for nausea. Negative for abdominal pain, diarrhea and vomiting.  Genitourinary: Negative for dysuria and flank pain.  Musculoskeletal: Negative for back pain and neck pain.  Skin: Negative for rash and wound.   Neurological: Positive for weakness. Negative for light-headedness.  All other systems reviewed and are negative.    ____________________________________________  PHYSICAL EXAM:      VITAL SIGNS: ED Triage Vitals  Enc Vitals Group     BP --      Pulse Rate 10/17/18 1556 (!) 102     Resp --      Temp --      Temp src --      SpO2 --      Weight 10/17/18 1557 168 lb (76.2 kg)     Height 10/17/18 1557 5\' 8"  (1.727 m)     Head Circumference --      Peak Flow --      Pain Score --      Pain Loc --      Pain Edu? --      Excl. in [place]? --      Physical Exam Vitals  signs and nursing note reviewed.  Constitutional:      General: He is not in acute distress.    Appearance: He is well-developed. He is diaphoretic.  HENT:     Head: Normocephalic and atraumatic.  Eyes:     Conjunctiva/sclera: Conjunctivae normal.  Neck:     Musculoskeletal: Neck supple.  Cardiovascular:     Rate and Rhythm: Normal rate and regular rhythm.     Heart sounds: Normal heart sounds. No murmur. No friction rub.  Pulmonary:     Effort: Pulmonary effort is normal. No respiratory distress.     Breath sounds: Normal breath sounds. No wheezing or rales.  Abdominal:     General: There is no distension.     Palpations: Abdomen is soft.     Tenderness: There is no abdominal tenderness.  Skin:    General: Skin is warm.     Capillary Refill: Capillary refill takes less than 2 seconds.  Neurological:     Mental Status: He is alert and oriented to person, place, and time.     Motor: No abnormal muscle tone.     ____________________________________________   LABS (all labs ordered are listed, but only abnormal results are displayed)  Labs Reviewed  CBC WITH DIFFERENTIAL/PLATELET - Abnormal; Notable for the following components:      Result Value   WBC 20.4 (*)    Neutro Abs 14.9 (*)    Monocytes Absolute 2.0 (*)    Abs Immature Granulocytes 0.29 (*)    All other components within normal limits   SARS CORONAVIRUS 2 (HOSPITAL ORDER, [place] [facility])  PROTIME-INR  APTT  COMPREHENSIVE METABOLIC PANEL  TROPONIN I  LIPID PANEL    ____________________________________________  EKG: ST segment elevation myocardial infarction noted in inferior leads, with reciprocal lateral changes.  Normal QRS and QT intervals.  When compared to prior, ST elevation is new.  There is also evidence of developing Q waves. ________________________________________  RADIOLOGY All imaging, including plain films, CT scans, and ultrasounds, independently reviewed by me, and interpretations confirmed via formal radiology reads.  ED MD interpretation:   Chest x-ray reviewed, shows mild right basilar atelectasis but no focal pneumonia.  The aortic notch and mediastinum appear normal.  Official radiology report(s): Dg Chest Port 1 View  Result Date: 10/17/2018 CLINICAL DATA:  Chest pain, STEMI, coronary artery disease post MI, hypertension EXAM: PORTABLE CHEST 1 VIEW COMPARISON:  Portable exam 1600 hours compared to 02/20/2015 FINDINGS: External pacing leads project over chest. Normal heart size, mediastinal contours, and pulmonary vascularity. Atherosclerotic calcification aorta. Minimal RIGHT basilar atelectasis. Lungs otherwise clear. No infiltrate, pleural effusion or pneumothorax. Prior resection versus resorption of distal LEFT clavicle. No acute osseous findings. IMPRESSION: Mild RIGHT basilar atelectasis. Electronically Signed   By: Lavonia Dana M.D.   On: 10/17/2018 16:32    ____________________________________________  PROCEDURES   Procedure(s) performed (including Critical Care):  .Critical Care Performed by: Duffy Bruce, MD Authorized by: Duffy Bruce, MD   Critical care provider statement:    Critical care time (minutes):  35   Critical care time was exclusive of:  Separately billable procedures and treating other patients and teaching time   Critical care was  necessary to treat or prevent imminent or life-threatening deterioration of the following conditions:  Circulatory failure and cardiac failure   Critical care was time spent personally by me on the following activities:  Development of treatment plan with patient or surrogate, discussions with consultants, evaluation of patient's  response to treatment, examination of patient, obtaining history from patient or surrogate, ordering and performing treatments and interventions, ordering and review of laboratory studies, ordering and review of radiographic studies, pulse oximetry, re-evaluation of patient's condition and review of old charts   I assumed direction of critical care for this patient from another provider in my specialty: no      ____________________________________________  INITIAL IMPRESSION / MDM / [place] / ED COURSE  As part of my medical decision making, I reviewed the following data within the [place] Notes from prior ED visits and [place] Controlled Substance Database      *Patrick Wilson was evaluated in Emergency Department on 10/17/2018 for the symptoms described in the history of present illness. He was evaluated in the context of the global COVID-19 pandemic, which necessitated consideration that the patient might be at risk for infection with the SARS-CoV-2 virus that causes COVID-19. Institutional protocols and algorithms that pertain to the evaluation of patients at risk for COVID-19 are in a state of rapid change based on information released by regulatory bodies including the CDC and federal and state organizations. These policies and algorithms were followed during the patient's care in the ED.  Some ED evaluations and interventions may be delayed as a result of limited staffing during the pandemic.*      Medical Decision Making: 85 year old male here with acute chest pain.  EKG obtained in the beginning of stress test reviewed by myself,  concerning for inferior STEMI.  Repeat EKG here shows persistent, but more subtle ST elevations with possible early Q waves.  Code STEMI activated immediately on arrival and case discussed with Dr. call would.  Patient has already received aspirin, was given nitroglycerin, as well as a heparin bolus.  Will be taken to the Cath Lab imminently.  Family updated.  ____________________________________________  FINAL CLINICAL IMPRESSION(S) / ED DIAGNOSES  Final diagnoses:  ST elevation myocardial infarction (STEMI), unspecified artery ([place])     MEDICATIONS GIVEN DURING THIS VISIT:  Medications  0.9 %  sodium chloride infusion (has no administration in time range)  aspirin chewable tablet 324 mg ( Oral MAR Hold 10/17/18 1633)  nitroGLYCERIN (NITROSTAT) SL tablet 0.4 mg ( Sublingual MAR Hold 10/17/18 1633)  Heparin (Porcine) in NaCl 1000-0.9 UT/500ML-% SOLN (500 mLs  Given 10/17/18 1638)  heparin injection 4,000 Units (4,000 Units Intravenous Given 10/17/18 1617)  midazolam (VERSED) 2 MG/2ML injection (has no administration in time range)  fentaNYL (SUBLIMAZE) 100 MCG/2ML injection (has no administration in time range)  Heparin (Porcine) in NaCl 1000-0.9 UT/500ML-% SOLN (has no administration in time range)  bivalirudin (ANGIOMAX) 250 MG injection (has no administration in time range)  ticagrelor (BRILINTA) 90 MG tablet (has no administration in time range)  clopidogrel (PLAVIX) 75 MG tablet (has no administration in time range)     ED Discharge Orders    None       Note:  This document was prepared using Dragon voice recognition software and may include unintentional dictation errors.   Duffy Bruce, MD 10/17/18 [phone]

## 2018-10-17 NOTE — ED Notes (Signed)
Pt being transported to cath lab at this time

## 2018-10-17 NOTE — ED Notes (Signed)
White socks, sneakers, shorts, belt, shirt, 1 necklace, 1 ring in belonings bag sent to cath lab

## 2018-10-18 ENCOUNTER — Encounter: Payer: Self-pay | Admitting: Internal Medicine

## 2018-10-18 LAB — CBC WITH DIFFERENTIAL/PLATELET
Abs Immature Granulocytes: 0.22 10*3/uL — ABNORMAL HIGH (ref 0.00–0.07)
Basophils Absolute: 0 10*3/uL (ref 0.0–0.1)
Basophils Relative: 0 %
Eosinophils Absolute: 0 10*3/uL (ref 0.0–0.5)
Eosinophils Relative: 0 %
HCT: 43 % (ref 39.0–52.0)
Hemoglobin: 14.1 g/dL (ref 13.0–17.0)
Immature Granulocytes: 1 %
Lymphocytes Relative: 7 %
Lymphs Abs: 1.2 10*3/uL (ref 0.7–4.0)
MCH: 31.3 pg (ref 26.0–34.0)
MCHC: 32.8 g/dL (ref 30.0–36.0)
MCV: 95.3 fL (ref 80.0–100.0)
Monocytes Absolute: 1.6 10*3/uL — ABNORMAL HIGH (ref 0.1–1.0)
Monocytes Relative: 9 %
Neutro Abs: 14.2 10*3/uL — ABNORMAL HIGH (ref 1.7–7.7)
Neutrophils Relative %: 83 %
Platelets: 220 10*3/uL (ref 150–400)
RBC: 4.51 MIL/uL (ref 4.22–5.81)
RDW: 13.5 % (ref 11.5–15.5)
WBC: 17.3 10*3/uL — ABNORMAL HIGH (ref 4.0–10.5)
nRBC: 0 % (ref 0.0–0.2)

## 2018-10-18 LAB — BASIC METABOLIC PANEL
Anion gap: 9 (ref 5–15)
BUN: 23 mg/dL (ref 8–23)
CO2: 25 mmol/L (ref 22–32)
Calcium: 8.4 mg/dL — ABNORMAL LOW (ref 8.9–10.3)
Chloride: 107 mmol/L (ref 98–111)
Creatinine, Ser: 1.18 mg/dL (ref 0.61–1.24)
GFR calc Af Amer: >60 mL/min (ref >60)
GFR calc non Af Amer: 57 mL/min — ABNORMAL LOW (ref >60)
Glucose, Bld: 107 mg/dL — ABNORMAL HIGH (ref 70–99)
Potassium: 4.3 mmol/L (ref 3.5–5.1)
Sodium: 141 mmol/L (ref 135–145)

## 2018-10-18 LAB — HEMOGLOBIN A1C
Hgb A1c MFr Bld: 5.5 % (ref 4.8–5.6)
Mean Plasma Glucose: 111.15 mg/dL

## 2018-10-18 LAB — GLUCOSE, CAPILLARY: Glucose-Capillary: 119 mg/dL — ABNORMAL HIGH (ref 70–99)

## 2018-10-18 LAB — TROPONIN I
Troponin I: 0.06 ng/mL (ref <0.03)
Troponin I: 0.05 ng/mL (ref <0.03)

## 2018-10-18 MED ORDER — METOPROLOL TARTRATE 25 MG PO TABS: 12.5000 mg | ORAL_TABLET | Freq: Two times a day (BID) | ORAL | 1 refills | Status: AC

## 2018-10-18 MED ORDER — LISINOPRIL 5 MG PO TABS: 2.5000 mg | ORAL_TABLET | Freq: Every day | ORAL | Status: DC

## 2018-10-18 MED ORDER — ATORVASTATIN CALCIUM 80 MG PO TABS: 80.0000 mg | ORAL_TABLET | Freq: Every day | ORAL | 1 refills | Status: DC

## 2018-10-18 MED ORDER — LISINOPRIL 2.5 MG PO TABS: 2.5000 mg | ORAL_TABLET | Freq: Every day | ORAL | 1 refills | Status: AC

## 2018-10-18 MED ORDER — TICAGRELOR 90 MG PO TABS: 90.0000 mg | ORAL_TABLET | Freq: Two times a day (BID) | ORAL | 1 refills | Status: AC

## 2018-10-18 NOTE — Plan of Care (Signed)
  Problem: Education: Goal: Understanding of cardiac disease, CV risk reduction, and recovery process will improve 10/18/2018 1241 by Jesse Sans, RN Outcome: Adequate for Discharge 10/18/2018 0758 by Jesse Sans, RN Outcome: Progressing Goal: Understanding of medication regimen will improve 10/18/2018 1241 by Jesse Sans, RN Outcome: Adequate for Discharge 10/18/2018 0758 by Jesse Sans, RN Outcome: Progressing   Problem: Activity: Goal: Ability to tolerate increased activity will improve 10/18/2018 1241 by Jesse Sans, RN Outcome: Adequate for Discharge 10/18/2018 0758 by Jesse Sans, RN Outcome: Progressing   Problem: Cardiac: Goal: Ability to achieve and maintain adequate cardiopulmonary perfusion will improve 10/18/2018 1241 by Jesse Sans, RN Outcome: Adequate for Discharge 10/18/2018 0758 by Jesse Sans, RN Outcome: Progressing Goal: Vascular access site(s) Level 0-1 will be maintained 10/18/2018 1241 by Jesse Sans, RN Outcome: Adequate for Discharge 10/18/2018 0758 by Jesse Sans, RN Outcome: Progressing   Problem: Health Behavior/Discharge Planning: Goal: Ability to safely manage health-related needs after discharge will improve 10/18/2018 1241 by Jesse Sans, RN Outcome: Adequate for Discharge 10/18/2018 0758 by Jesse Sans, RN Outcome: Progressing

## 2018-10-18 NOTE — Plan of Care (Signed)
  Problem: Education: Goal: Understanding of cardiac disease, CV risk reduction, and recovery process will improve 10/18/2018 0758 by Jesse Sans, RN Outcome: Progressing 10/17/2018 1817 by Jesse Sans, RN Outcome: Progressing Goal: Understanding of medication regimen will improve 10/18/2018 0758 by Jesse Sans, RN Outcome: Progressing 10/17/2018 1817 by Jesse Sans, RN Outcome: Progressing Goal: Individualized Educational Video(s) Outcome: Progressing   Problem: Activity: Goal: Ability to tolerate increased activity will improve 10/18/2018 0758 by Jesse Sans, RN Outcome: Progressing 10/17/2018 1817 by Jesse Sans, RN Outcome: Progressing   Problem: Cardiac: Goal: Ability to achieve and maintain adequate cardiopulmonary perfusion will improve 10/18/2018 0758 by Jesse Sans, RN Outcome: Progressing 10/17/2018 1817 by Jesse Sans, RN Outcome: Progressing Goal: Vascular access site(s) Level 0-1 will be maintained 10/18/2018 0758 by Jesse Sans, RN Outcome: Progressing 10/17/2018 1817 by Jesse Sans, RN Outcome: Progressing   Problem: Health Behavior/Discharge Planning: Goal: Ability to safely manage health-related needs after discharge will improve 10/18/2018 0758 by Jesse Sans, RN Outcome: Progressing 10/17/2018 1817 by Jesse Sans, RN Outcome: Progressing

## 2018-10-18 NOTE — Progress Notes (Signed)
Subjective:  Patient feels reasonably well no further pain feels excellent ready to go home ready to ambulate  Objective:  Vital Signs in the last 24 hours: Temp:  [97.4 F (36.3 C)-98.1 F (36.7 C)] 98 F (36.7 C) (06/18 0800) Pulse Rate:  [55-102] 66 (06/18 0800) Resp:  [11-27] 18 (06/18 0800) BP: (89-143)/(57-96) 104/94 (06/18 0800) SpO2:  [92 %-100 %] 98 % (06/18 0800) Weight:  [76.2 kg-78.9 kg] 78.9 kg (06/17 1755)  Intake/Output from previous day: 06/17 0701 - 06/18 0700 In: 816 [I.V.:816] Out: 875 [Urine:875] Intake/Output from this shift: Total I/O In: 360 [P.O.:360] Out: -   Physical Exam: General appearance: appears stated age Neck: no adenopathy, no carotid bruit, no JVD, supple, symmetrical, trachea midline and thyroid not enlarged, symmetric, no tenderness/mass/nodules Lungs: clear to auscultation bilaterally Heart: regular rate and rhythm, S1, S2 normal, no murmur, click, rub or gallop Abdomen: soft, non-tender; bowel sounds normal; no masses,  no organomegaly Extremities: extremities normal, atraumatic, no cyanosis or edema Pulses: 2+ and symmetric Skin: Skin color, texture, turgor normal. No rashes or lesions Neurologic: Alert and oriented X 3, normal strength and tone. Normal symmetric reflexes. Normal coordination and gait  Lab Results: Recent Labs    10/17/18 1604 10/18/18 0030  WBC 20.4* 17.3*  HGB 16.0 14.1  PLT 254 220   Recent Labs    10/17/18 1604 10/18/18 0030  NA 142 141  K 4.5 4.3  CL 102 107  CO2 24 25  GLUCOSE 97 107*  BUN 23 23  CREATININE 1.35* 1.18   Recent Labs    10/18/18 0030 10/18/18 0626  TROPONINI 0.06* 0.05*   Hepatic Function Panel Recent Labs    10/17/18 1604  PROT 6.9  ALBUMIN 4.1  AST 32  ALT 39  ALKPHOS 59  BILITOT 0.6   Recent Labs    10/17/18 1604  CHOL 138   No results for input(s): PROTIME in the last 72 hours.  Imaging: Imaging results have been reviewed  Cardiac  Studies:  Assessment/Plan:  Status post acute STEMI inferior wall Angina Chest Pain Coronary Artery Disease Coronary Artery Stent Shortness of Breath  Renal insufficiency II Hyperlipidemia GERD . Plan Agree with admit to telemetry Continue post STEMI therapy aspirin Brilinta statin beta-blocker ACE inhibitor Discontinue amlodipine for now Increase activity ambulate in the home Refer the patient to cardiac rehab Consider referral to nephrology for mild renal sufficiency Consider reflux therapy with omeprazole or Protonix Have the patient follow-up with Dr. Priscille Loveless 1 to 2 weeks Patient appears stable enough to discharge later today after he ambulates  LOS: 1 day    Katharine Rochefort D Lida Berkery 10/18/2018, 8:31 AM

## 2018-10-18 NOTE — Progress Notes (Signed)
Patient's daughter updated at this time

## 2018-10-18 NOTE — Progress Notes (Signed)
Discharge packet given to, and discussed with, the patient. IVs removed. Patient has all personal belongings in hand. Prescriptions given to patient. Discharge education complete. Patient discharging to home today. Good overall physical condition; stable, ready for D/C.

## 2018-10-19 NOTE — Discharge Summary (Signed)
[facility] [place] NAME: Patrick Wilson    MR#:  [id number]  DATE OF BIRTH:  Mar 28, 1934  DATE OF ADMISSION:  10/17/2018 ADMITTING PHYSICIAN: Yolonda Kida, MD  DATE OF DISCHARGE: 10/18/2018  1:31 PM  PRIMARY CARE PHYSICIAN: Rusty Aus, MD    ADMISSION DIAGNOSIS:  STEMI involving right coronary artery ([place]) [I21.11]  DISCHARGE DIAGNOSIS:  Active Problems:   STEMI involving right coronary artery ([facility])   STEMI (ST elevation myocardial infarction) ([place])   SECONDARY DIAGNOSIS:   Past Medical History:  Diagnosis Date  . Cancer ([place])    SKIN  . Coronary artery disease   . Depression   . GERD (gastroesophageal reflux disease)   . History of kidney stones   . Hypertension   . Myocardial infarction [facility])    2012    HOSPITAL COURSE:   85 year old male with past medical history of coronary artery disease, hypertension, GERD, depression, previous history of MI who presented to the hospital due to chest pain and noted to have an ST elevation MI.  1.  ST elevation MI-patient presented to the hospital with acute chest pain and EKG changes in the inferior leads consistent with inferior MI.  Patient was urgently taken to the cardiac catheterization and underwent drug-eluting stent to the RCA. - Post cardiac catheterization patient has had no chest pain is currently stable.  He will be discharged on aspirin, Brilinta, atorvastatin, low-dose beta-blocker and lisinopril. -he will follow-up with Dr. Saralyn Pilar in the next 1 to 2 weeks.  2.  Essential hypertension-patient's blood pressure was a bit on the soft side.  Patient's antihypertensive regimen was changed.  Patient was taken off the hydrochlorothiazide and Norvasc and placed on low-dose metoprolol and lisinopril.  3.  GERD-patient will continue omeprazole.  4.  BPH-patient has new urinary retention.  - he will cont. Flomax.   5. Anxiety - pt. Will cont. Xanax.   6. Depression - pt.  Will cont. Effexor.   DISCHARGE CONDITIONS:   Stable.   CONSULTS OBTAINED:  Treatment Team:  [facility], [facility]-, MD  DRUG ALLERGIES:   Allergies  Allergen Reactions  . Penicillins Rash and Other (See Comments)    Has patient had a PCN reaction causing immediate rash, facial/tongue/throat swelling, SOB or lightheadedness with hypotension: No Has patient had a PCN reaction causing severe rash involving mucus membranes or skin necrosis: No Has patient had a PCN reaction that required hospitalization No Has patient had a PCN reaction occurring within the last 10 years: No If all of the above answers are "NO", then may proceed with Cephalosporin use.    DISCHARGE MEDICATIONS:   Allergies as of 10/18/2018      Reactions   Penicillins Rash, Other (See Comments)   Has patient had a PCN reaction causing immediate rash, facial/tongue/throat swelling, SOB or lightheadedness with hypotension: No Has patient had a PCN reaction causing severe rash involving mucus membranes or skin necrosis: No Has patient had a PCN reaction that required hospitalization No Has patient had a PCN reaction occurring within the last 10 years: No If all of the above answers are "NO", then may proceed with Cephalosporin use.      Medication List    STOP taking these medications   amLODipine 5 MG tablet Commonly known as: NORVASC   clopidogrel 75 MG tablet Commonly known as: PLAVIX   hydrochlorothiazide 12.5 MG capsule Commonly known as: Microzide     TAKE these medications  ALPRAZolam 0.25 MG tablet Commonly known as: XANAX Take 0.25 mg by mouth 2 (two) times a day.   aspirin EC 81 MG tablet Take 81 mg by mouth daily.   atorvastatin 80 MG tablet Commonly known as: LIPITOR Take 1 tablet (80 mg total) by mouth at bedtime. What changed:   medication strength  how much to take   BL CENTURY SENIOR PO Take 1 tablet by mouth daily.   Denture Adhesive Crea Apply 1 application topically daily  as needed.   docusate sodium 100 MG capsule Commonly known as: COLACE Take 1 capsule (100 mg total) by mouth 2 (two) times daily.   EQ Allergy 25 mg capsule Generic drug: diphenhydrAMINE Take 25 mg by mouth every 6 (six) hours as needed for allergies.   Fish Oil 1000 MG Caps Take 1,000 mg by mouth daily.   lisinopril 2.5 MG tablet Commonly known as: ZESTRIL Take 1 tablet (2.5 mg total) by mouth daily.   magnesium gluconate 500 MG tablet Commonly known as: MAGONATE Take 500 mg by mouth daily.   meloxicam 15 MG tablet Commonly known as: MOBIC Take 15 mg by mouth at bedtime.   metoCLOPramide 5 MG tablet Commonly known as: REGLAN Take 5 mg by mouth 2 (two) times daily.   metoprolol tartrate 25 MG tablet Commonly known as: LOPRESSOR Take 0.5 tablets (12.5 mg total) by mouth every 12 (twelve) hours.   nitroGLYCERIN 0.4 MG SL tablet Commonly known as: NITROSTAT Place 0.4 mg under the tongue every 5 (five) minutes as needed for chest pain. Reported on 11/13/2015   omeprazole 40 MG capsule Commonly known as: PRILOSEC Take 40 mg by mouth 2 (two) times daily.   sucralfate 1 g tablet Commonly known as: CARAFATE Take 1 g by mouth 4 (four) times daily.   tamsulosin 0.4 MG Caps capsule Commonly known as: FLOMAX Take 1 capsule (0.4 mg total) by mouth daily.   ticagrelor 90 MG Tabs tablet Commonly known as: BRILINTA Take 1 tablet (90 mg total) by mouth 2 (two) times daily.   traMADol 50 MG tablet Commonly known as: ULTRAM Take 50 mg by mouth every 8 (eight) hours as needed for pain.   traZODone 100 MG tablet Commonly known as: DESYREL Take 100 mg by mouth at bedtime.   venlafaxine XR 37.5 MG 24 hr capsule Commonly known as: EFFEXOR-XR Take 37.5 mg by mouth daily.   VITAMIN B COMPLEX PO Take 1 tablet by mouth daily.         DISCHARGE INSTRUCTIONS:   DIET:  Cardiac diet  DISCHARGE CONDITION:  Stable  ACTIVITY:  Activity as tolerated  OXYGEN:  Home  Oxygen: No.   Oxygen Delivery: room air  DISCHARGE LOCATION:  home   If you experience worsening of your admission symptoms, develop shortness of breath, life threatening emergency, suicidal or homicidal thoughts you must seek medical attention immediately by calling 911 or calling your MD immediately  if symptoms less severe.  You Must read complete instructions/literature along with all the possible adverse reactions/side effects for all the Medicines you take and that have been prescribed to you. Take any new Medicines after you have completely understood and accpet all the possible adverse reactions/side effects.   Please note  You were cared for by a hospitalist during your hospital stay. If you have any questions about your discharge medications or the care you received while you were in the hospital after you are discharged, you can call the unit and asked to speak  with the hospitalist on call if the hospitalist that took care of you is not available. Once you are discharged, your primary care physician will handle any further medical issues. Please note that NO REFILLS for any discharge medications will be authorized once you are discharged, as it is imperative that you return to your primary care physician (or establish a relationship with a primary care physician if you do not have one) for your aftercare needs so that they can reassess your need for medications and monitor your lab values.     Today   No acute chest pain presently.  Patient feels a lot better since his cardiac catheterization and stent placement.  Ambulated with no acute symptoms.  Will discharge home today.  VITAL SIGNS:  Blood pressure 111/66, pulse 62, temperature 98.1 F (36.7 C), temperature source Oral, resp. rate 18, height 5\' 8"  (1.727 m), weight 78.9 kg, SpO2 97 %.  I/O:  No intake or output data in the 24 hours ending 10/19/18 1542  PHYSICAL EXAMINATION:  GENERAL:  85 y.o.-year-old patient lying in  the bed with no acute distress.  EYES: Pupils equal, round, reactive to light and accommodation. No scleral icterus. Extraocular muscles intact.  HEENT: Head atraumatic, normocephalic. Oropharynx and nasopharynx clear.  NECK:  Supple, no jugular venous distention. No thyroid enlargement, no tenderness.  LUNGS: Normal breath sounds bilaterally, no wheezing, rales,rhonchi. No use of accessory muscles of respiration.  CARDIOVASCULAR: S1, S2 normal. No murmurs, rubs, or gallops.  ABDOMEN: Soft, non-tender, non-distended. Bowel sounds present. No organomegaly or mass.  EXTREMITIES: No pedal edema, cyanosis, or clubbing.  NEUROLOGIC: Cranial nerves II through XII are intact. No focal motor or sensory defecits b/l.  PSYCHIATRIC: The patient is alert and oriented x 3.  SKIN: No obvious rash, lesion, or ulcer.   DATA REVIEW:   CBC Recent Labs  Lab 10/18/18 0030  WBC 17.3*  HGB 14.1  HCT 43.0  PLT 220    Chemistries  Recent Labs  Lab 10/17/18 1604 10/18/18 0030  NA 142 141  K 4.5 4.3  CL 102 107  CO2 24 25  GLUCOSE 97 107*  BUN 23 23  CREATININE 1.35* 1.18  CALCIUM 9.7 8.4*  AST 32  --   ALT 39  --   ALKPHOS 59  --   BILITOT 0.6  --     Cardiac Enzymes Recent Labs  Lab 10/18/18 0626  TROPONINI 0.05*    Microbiology Results  Results for orders placed or performed during the hospital encounter of 10/17/18  SARS Coronavirus 2 (CEPHEID - Performed in [place] hospital lab), Hosp Order     Status: None   Collection Time: 10/17/18  4:04 PM   Specimen: Nasopharyngeal Swab  Result Value Ref Range Status   SARS Coronavirus 2 NEGATIVE NEGATIVE Final    Comment: (NOTE) If result is NEGATIVE SARS-CoV-2 target nucleic acids are NOT DETECTED. The SARS-CoV-2 RNA is generally detectable in upper and lower  respiratory specimens during the acute phase of infection. The lowest  concentration of SARS-CoV-2 viral copies this assay can detect is 250  copies / mL. A negative result  does not preclude SARS-CoV-2 infection  and should not be used as the sole basis for treatment or other  patient management decisions.  A negative result may occur with  improper specimen collection / handling, submission of specimen other  than nasopharyngeal swab, presence of viral mutation(s) within the  areas targeted by this assay, and inadequate number of viral  copies  (<250 copies / mL). A negative result must be combined with clinical  observations, patient history, and epidemiological information. If result is POSITIVE SARS-CoV-2 target nucleic acids are DETECTED. The SARS-CoV-2 RNA is generally detectable in upper and lower  respiratory specimens dur ing the acute phase of infection.  Positive  results are indicative of active infection with SARS-CoV-2.  Clinical  correlation with patient history and other diagnostic information is  necessary to determine patient infection status.  Positive results do  not rule out bacterial infection or co-infection with other viruses. If result is PRESUMPTIVE POSTIVE SARS-CoV-2 nucleic acids MAY BE PRESENT.   A presumptive positive result was obtained on the submitted specimen  and confirmed on repeat testing.  While 2019 novel coronavirus  (SARS-CoV-2) nucleic acids may be present in the submitted sample  additional confirmatory testing may be necessary for epidemiological  and / or clinical management purposes  to differentiate between  SARS-CoV-2 and other Sarbecovirus currently known to infect humans.  If clinically indicated additional testing with an alternate test  methodology [phone]) is advised. The SARS-CoV-2 RNA is generally  detectable in upper and lower respiratory sp ecimens during the acute  phase of infection. The expected result is Negative. Fact Sheet for Patients:  StrictlyIdeas.no Fact Sheet for Healthcare Providers: BankingDealers.co.za This test is not yet approved or  cleared by the [place] FDA and has been authorized for detection and/or diagnosis of SARS-CoV-2 by FDA under an Emergency Use Authorization (EUA).  This EUA will remain in effect (meaning this test can be used) for the duration of the COVID-19 declaration under Section 564(b)(1) of the Act, 21 U.S.C. section 360bbb-3(b)(1), unless the authorization is terminated or revoked sooner. Performed at [facility], [address] [place], [place] [address]   MRSA PCR Screening     Status: None   Collection Time: 10/17/18  5:52 PM   Specimen: Nasal Mucosa; Nasopharyngeal  Result Value Ref Range Status   MRSA by PCR NEGATIVE NEGATIVE Final    Comment:        The GeneXpert MRSA Assay (FDA approved for NASAL specimens only), is one component of a comprehensive MRSA colonization surveillance program. It is not intended to diagnose MRSA infection nor to guide or monitor treatment for MRSA infections. Performed at [facility], [address] [place], [place] [address]     RADIOLOGY:  Dg Chest Port 1 View  Result Date: 10/17/2018 CLINICAL DATA:  Chest pain, STEMI, coronary artery disease post MI, hypertension EXAM: PORTABLE CHEST 1 VIEW COMPARISON:  Portable exam 1600 hours compared to 02/20/2015 FINDINGS: External pacing leads project over chest. Normal heart size, mediastinal contours, and pulmonary vascularity. Atherosclerotic calcification aorta. Minimal RIGHT basilar atelectasis. Lungs otherwise clear. No infiltrate, pleural effusion or pneumothorax. Prior resection versus resorption of distal LEFT clavicle. No acute osseous findings. IMPRESSION: Mild RIGHT basilar atelectasis. Electronically Signed   By: Lavonia Dana M.D.   On: 10/17/2018 16:32      Management plans discussed with the patient, family and they are in agreement.  CODE STATUS:  Code Status History    Date Active Date Inactive Code Status Order ID Comments User Context   10/17/2018 1735  10/18/2018 1637 Full Code [id number]  Yolonda Kida, MD Inpatient   Advance Care Planning Activity      TOTAL TIME TAKING CARE OF THIS PATIENT: 40 minutes.    Henreitta Leber M.D on 10/19/2018 at 3:42 PM  Between 7am to 6pm -  Pager - [phone]  After 6pm go to [email or web address] - Proofreader  [facility]  Office  [phone]  CC: Primary care physician; Rusty Aus, MD

## 2018-10-22 ENCOUNTER — Ambulatory Visit: Payer: Medicare HMO

## 2018-10-23 DIAGNOSIS — I2510 Atherosclerotic heart disease of native coronary artery without angina pectoris: Secondary | ICD-10-CM | POA: Diagnosis not present

## 2018-10-23 DIAGNOSIS — I2111 ST elevation (STEMI) myocardial infarction involving right coronary artery: Secondary | ICD-10-CM | POA: Diagnosis not present

## 2018-10-26 DIAGNOSIS — E782 Mixed hyperlipidemia: Secondary | ICD-10-CM | POA: Diagnosis not present

## 2018-10-26 DIAGNOSIS — I2510 Atherosclerotic heart disease of native coronary artery without angina pectoris: Secondary | ICD-10-CM | POA: Diagnosis not present

## 2018-10-26 DIAGNOSIS — Z9889 Other specified postprocedural states: Secondary | ICD-10-CM | POA: Diagnosis not present

## 2018-10-26 DIAGNOSIS — I2119 ST elevation (STEMI) myocardial infarction involving other coronary artery of inferior wall: Secondary | ICD-10-CM | POA: Diagnosis not present

## 2018-10-26 DIAGNOSIS — I10 Essential (primary) hypertension: Secondary | ICD-10-CM | POA: Diagnosis not present

## 2018-12-06 DIAGNOSIS — Z08 Encounter for follow-up examination after completed treatment for malignant neoplasm: Secondary | ICD-10-CM | POA: Diagnosis not present

## 2018-12-06 DIAGNOSIS — D692 Other nonthrombocytopenic purpura: Secondary | ICD-10-CM | POA: Diagnosis not present

## 2018-12-06 DIAGNOSIS — D2261 Melanocytic nevi of right upper limb, including shoulder: Secondary | ICD-10-CM | POA: Diagnosis not present

## 2018-12-06 DIAGNOSIS — X32XXXA Exposure to sunlight, initial encounter: Secondary | ICD-10-CM | POA: Diagnosis not present

## 2018-12-06 DIAGNOSIS — D2271 Melanocytic nevi of right lower limb, including hip: Secondary | ICD-10-CM | POA: Diagnosis not present

## 2018-12-06 DIAGNOSIS — L570 Actinic keratosis: Secondary | ICD-10-CM | POA: Diagnosis not present

## 2018-12-06 DIAGNOSIS — D2262 Melanocytic nevi of left upper limb, including shoulder: Secondary | ICD-10-CM | POA: Diagnosis not present

## 2018-12-06 DIAGNOSIS — Z85828 Personal history of other malignant neoplasm of skin: Secondary | ICD-10-CM | POA: Diagnosis not present

## 2018-12-17 DIAGNOSIS — E782 Mixed hyperlipidemia: Secondary | ICD-10-CM | POA: Diagnosis not present

## 2018-12-21 DIAGNOSIS — H353131 Nonexudative age-related macular degeneration, bilateral, early dry stage: Secondary | ICD-10-CM | POA: Diagnosis not present

## 2018-12-31 DIAGNOSIS — I2119 ST elevation (STEMI) myocardial infarction involving other coronary artery of inferior wall: Secondary | ICD-10-CM | POA: Diagnosis not present

## 2018-12-31 DIAGNOSIS — Z125 Encounter for screening for malignant neoplasm of prostate: Secondary | ICD-10-CM | POA: Diagnosis not present

## 2019-01-23 DIAGNOSIS — I2119 ST elevation (STEMI) myocardial infarction involving other coronary artery of inferior wall: Secondary | ICD-10-CM | POA: Diagnosis not present

## 2019-01-23 DIAGNOSIS — Z9889 Other specified postprocedural states: Secondary | ICD-10-CM | POA: Diagnosis not present

## 2019-01-23 DIAGNOSIS — I10 Essential (primary) hypertension: Secondary | ICD-10-CM | POA: Diagnosis not present

## 2019-01-23 DIAGNOSIS — E782 Mixed hyperlipidemia: Secondary | ICD-10-CM | POA: Diagnosis not present

## 2019-03-06 DIAGNOSIS — F0390 Unspecified dementia without behavioral disturbance: Secondary | ICD-10-CM | POA: Diagnosis not present

## 2019-03-06 DIAGNOSIS — I739 Peripheral vascular disease, unspecified: Secondary | ICD-10-CM | POA: Insufficient documentation

## 2019-03-06 DIAGNOSIS — R0602 Shortness of breath: Secondary | ICD-10-CM | POA: Diagnosis not present

## 2019-03-06 DIAGNOSIS — Z87891 Personal history of nicotine dependence: Secondary | ICD-10-CM | POA: Diagnosis not present

## 2019-03-06 DIAGNOSIS — J3489 Other specified disorders of nose and nasal sinuses: Secondary | ICD-10-CM | POA: Diagnosis not present

## 2019-04-01 ENCOUNTER — Other Ambulatory Visit: Payer: Self-pay

## 2019-04-01 ENCOUNTER — Encounter: Payer: Self-pay | Admitting: *Deleted

## 2019-04-01 ENCOUNTER — Encounter: Payer: Medicare HMO | Attending: Internal Medicine | Admitting: *Deleted

## 2019-04-01 DIAGNOSIS — I213 ST elevation (STEMI) myocardial infarction of unspecified site: Secondary | ICD-10-CM

## 2019-04-01 NOTE — Progress Notes (Signed)
Virtual Orientation call completed. Has appointment tomorrow with Ep for Eval and Gym Orientation. Diagnosis documentation can be found in [facility] 10/17/2018.

## 2019-04-02 ENCOUNTER — Encounter: Payer: Medicare HMO | Attending: Internal Medicine

## 2019-04-02 VITALS — Ht 68.0 in | Wt 175.2 lb

## 2019-04-02 DIAGNOSIS — I252 Old myocardial infarction: Secondary | ICD-10-CM | POA: Diagnosis not present

## 2019-04-02 DIAGNOSIS — Z791 Long term (current) use of non-steroidal anti-inflammatories (NSAID): Secondary | ICD-10-CM | POA: Diagnosis not present

## 2019-04-02 DIAGNOSIS — I2510 Atherosclerotic heart disease of native coronary artery without angina pectoris: Secondary | ICD-10-CM | POA: Diagnosis not present

## 2019-04-02 DIAGNOSIS — Z85828 Personal history of other malignant neoplasm of skin: Secondary | ICD-10-CM | POA: Insufficient documentation

## 2019-04-02 DIAGNOSIS — Z79899 Other long term (current) drug therapy: Secondary | ICD-10-CM | POA: Diagnosis not present

## 2019-04-02 DIAGNOSIS — K219 Gastro-esophageal reflux disease without esophagitis: Secondary | ICD-10-CM | POA: Diagnosis not present

## 2019-04-02 DIAGNOSIS — I213 ST elevation (STEMI) myocardial infarction of unspecified site: Secondary | ICD-10-CM | POA: Diagnosis not present

## 2019-04-02 DIAGNOSIS — F329 Major depressive disorder, single episode, unspecified: Secondary | ICD-10-CM | POA: Insufficient documentation

## 2019-04-02 DIAGNOSIS — Z7982 Long term (current) use of aspirin: Secondary | ICD-10-CM | POA: Insufficient documentation

## 2019-04-02 DIAGNOSIS — I10 Essential (primary) hypertension: Secondary | ICD-10-CM | POA: Insufficient documentation

## 2019-04-02 NOTE — Progress Notes (Signed)
Cardiac Individual Treatment Plan  Patient Details  Name: Patrick Wilson MRN: [id number] Date of Birth: [birth year] Referring Provider:     Cardiac Rehab from 04/02/2019 in [facility] Cardiac and Pulmonary Rehab  Referring Provider  Sabra Heck [[place]]      Initial Encounter Date:    Cardiac Rehab from 04/02/2019 in [facility] Cardiac and Pulmonary Rehab  Date  04/02/19      Visit Diagnosis: ST elevation myocardial infarction (STEMI), unspecified artery ([place])  Patient's Home Medications on Admission:  Current Outpatient Medications:  .  ALPRAZolam (XANAX) 0.25 MG tablet, Take 0.25 mg by mouth 2 (two) times a day., Disp: , Rfl:  .  aspirin EC 81 MG tablet, Take 81 mg by mouth daily., Disp: , Rfl:  .  atorvastatin (LIPITOR) 80 MG tablet, Take 1 tablet (80 mg total) by mouth at bedtime., Disp: 30 tablet, Rfl: 1 .  atorvastatin (LIPITOR) 80 MG tablet, Take by mouth., Disp: , Rfl:  .  B Complex Vitamins (VITAMIN B COMPLEX PO), Take 1 tablet by mouth daily. , Disp: , Rfl:  .  Denture Care Products (DENTURE ADHESIVE) CREA, Apply 1 application topically daily as needed. , Disp: , Rfl:  .  diphenhydrAMINE (EQ ALLERGY) 25 mg capsule, Take 25 mg by mouth every 6 (six) hours as needed for allergies., Disp: , Rfl:  .  docusate sodium (COLACE) 100 MG capsule, Take 1 capsule (100 mg total) by mouth 2 (two) times daily. (Patient not taking: Reported on 04/01/2019), Disp: 60 capsule, Rfl: 0 .  donepezil (ARICEPT) 10 MG tablet, Take by mouth., Disp: , Rfl:  .  lisinopril (ZESTRIL) 2.5 MG tablet, Take 1 tablet (2.5 mg total) by mouth daily., Disp: 30 tablet, Rfl: 1 .  magnesium gluconate (MAGONATE) 500 MG tablet, Take 500 mg by mouth daily., Disp: , Rfl:  .  meloxicam (MOBIC) 15 MG tablet, Take 15 mg by mouth at bedtime., Disp: , Rfl:  .  metoCLOPramide (REGLAN) 5 MG tablet, Take 5 mg by mouth 2 (two) times daily., Disp: , Rfl:  .  metoprolol tartrate (LOPRESSOR) 25 MG tablet, Take 0.5 tablets (12.5 mg total) by mouth  every 12 (twelve) hours., Disp: 30 tablet, Rfl: 1 .  Multiple Vitamins-Minerals (BL CENTURY SENIOR PO), Take 1 tablet by mouth daily., Disp: , Rfl:  .  nitroGLYCERIN (NITROSTAT) 0.4 MG SL tablet, Place 0.4 mg under the tongue every 5 (five) minutes as needed for chest pain. Reported on 11/13/2015, Disp: , Rfl:  .  olmesartan (BENICAR) 20 MG tablet, Take by mouth., Disp: , Rfl:  .  Omega-3 Fatty Acids (FISH OIL) 1000 MG CAPS, Take 1,000 mg by mouth daily., Disp: , Rfl:  .  omeprazole (PRILOSEC) 40 MG capsule, Take 40 mg by mouth 2 (two) times daily., Disp: , Rfl:  .  sucralfate (CARAFATE) 1 g tablet, Take 1 g by mouth 4 (four) times daily., Disp: , Rfl:  .  tamsulosin (FLOMAX) 0.4 MG CAPS capsule, Take 1 capsule (0.4 mg total) by mouth daily., Disp: 30 capsule, Rfl: 0 .  ticagrelor (BRILINTA) 90 MG TABS tablet, Take by mouth., Disp: , Rfl:  .  traZODone (DESYREL) 100 MG tablet, Take 100 mg by mouth at bedtime., Disp: , Rfl:  .  venlafaxine XR (EFFEXOR-XR) 37.5 MG 24 hr capsule, Take 37.5 mg by mouth daily., Disp: , Rfl: 3  Past Medical History: Past Medical History:  Diagnosis Date  . Cancer ([place])    SKIN  . Coronary artery disease   .  Depression   . GERD (gastroesophageal reflux disease)   . History of kidney stones   . Hypertension   . Myocardial infarction ([place])    2012    Tobacco Use: Social History   Tobacco Use  Smoking Status Never Smoker  Smokeless Tobacco Current User  . Types: Chew  Tobacco Comment   May be ready to quit.    Labs: Recent Review Flowsheet Data    Labs for ITP Cardiac and Pulmonary Rehab Latest Ref Rng & Units 10/17/2018 10/18/2018   Cholestrol 0 - 200 mg/dL 138 -   LDLCALC 0 - 99 mg/dL 54 -   HDL >40 mg/dL 64 -   Trlycerides <150 mg/dL 101 -   Hemoglobin A1c 4.8 - 5.6 % - 5.5       Exercise Target Goals: Exercise Program Goal: Individual exercise prescription set using results from initial 6 min walk test and THRR while considering  patient's  activity barriers and safety.   Exercise Prescription Goal: Initial exercise prescription builds to 30-45 minutes a day of aerobic activity, 2-3 days per week.  Home exercise guidelines will be given to patient during program as part of exercise prescription that the participant will acknowledge.  Activity Barriers & Risk Stratification: Activity Barriers & Cardiac Risk Stratification - 04/01/19 1222      Activity Barriers & Cardiac Risk Stratification   Activity Barriers  None    Cardiac Risk Stratification  High       6 Minute Walk: 6 Minute Walk    Row Name 04/02/19 1458         6 Minute Walk   Phase  Initial     Distance  1600 feet     Walk Time  6 minutes     # of Rest Breaks  0     MPH  3.03     METS  2.98     RPE  12     Perceived Dyspnea   1     VO2 Peak  10.44     Symptoms  No     Resting HR  65 bpm     Resting BP  134/68     Resting Oxygen Saturation   97 %     Exercise Oxygen Saturation  during 6 min walk  95 %     Max Ex. HR  2.31 bpm     Max Ex. BP  148/64     2 Minute Post BP  138/74        Oxygen Initial Assessment:   Oxygen Re-Evaluation:   Oxygen Discharge (Final Oxygen Re-Evaluation):   Initial Exercise Prescription: Initial Exercise Prescription - 04/02/19 1500      Date of Initial Exercise RX and Referring Provider   Date  04/02/19    Referring Provider  [name]     Treadmill   MPH  2.7    Grade  0    Minutes  15    METs  3.07      Recumbant Bike   Level  3    RPM  60    Watts  25    Minutes  15    METs  3      Elliptical   Level  1    Speed  3    Minutes  15      REL-XR   Level  3    Speed  50    Minutes  15  METs  3      T5 Nustep   Level  2    SPM  80    Minutes  15    METs  3      Biostep-RELP   Level  3    SPM  50    Minutes  15    METs  3      Prescription Details   Frequency (times per week)  3    Duration  Progress to 30 minutes of continuous aerobic without signs/symptoms of  physical distress      Intensity   THRR 40-80% of Max Heartrate  94-122    Ratings of Perceived Exertion  11-15    Perceived Dyspnea  0-4      Resistance Training   Training Prescription  Yes    Weight  4 lb    Reps  10-15       Perform Capillary Blood Glucose checks as needed.  Exercise Prescription Changes: Exercise Prescription Changes    Row Name 04/02/19 1500             Response to Exercise   Blood Pressure (Admit)  134/68       Blood Pressure (Exercise)  148/64       Blood Pressure (Exit)  138/74       Heart Rate (Admit)  65 bpm       Heart Rate (Exercise)  107 bpm       Heart Rate (Exit)  76 bpm       Oxygen Saturation (Admit)  95 %       Oxygen Saturation (Exercise)  97 %       Rating of Perceived Exertion (Exercise)  12       Perceived Dyspnea (Exercise)  1       Symptoms  none       Comments  walk test          Exercise Comments:   Exercise Goals and Review: Exercise Goals    Row Name 04/02/19 1510             Exercise Goals   Increase Physical Activity  Yes       Intervention  Provide advice, education, support and counseling about physical activity/exercise needs.;Develop an individualized exercise prescription for aerobic and resistive training based on initial evaluation findings, risk stratification, comorbidities and participant's personal goals.       Expected Outcomes  Short Term: Attend rehab on a regular basis to increase amount of physical activity.;Long Term: Add in home exercise to make exercise part of routine and to increase amount of physical activity.;Long Term: Exercising regularly at least 3-5 days a week.       Increase Strength and Stamina  Yes       Intervention  Provide advice, education, support and counseling about physical activity/exercise needs.;Develop an individualized exercise prescription for aerobic and resistive training based on initial evaluation findings, risk stratification, comorbidities and participant's  personal goals.       Expected Outcomes  Short Term: Increase workloads from initial exercise prescription for resistance, speed, and METs.;Short Term: Perform resistance training exercises routinely during rehab and add in resistance training at home;Long Term: Improve cardiorespiratory fitness, muscular endurance and strength as measured by increased METs and functional capacity (6MWT)       Able to understand and use rate of perceived exertion (RPE) scale  Yes       Intervention  Provide education and explanation on  how to use RPE scale       Expected Outcomes  Short Term: Able to use RPE daily in rehab to express subjective intensity level;Long Term:  Able to use RPE to guide intensity level when exercising independently       Knowledge and understanding of Target Heart Rate Range (THRR)  Yes       Intervention  Provide education and explanation of THRR including how the numbers were predicted and where they are located for reference       Expected Outcomes  Short Term: Able to state/look up THRR;Short Term: Able to use daily as guideline for intensity in rehab;Long Term: Able to use THRR to govern intensity when exercising independently       Able to check pulse independently  Yes       Intervention  Provide education and demonstration on how to check pulse in carotid and radial arteries.;Review the importance of being able to check your own pulse for safety during independent exercise       Expected Outcomes  Short Term: Able to explain why pulse checking is important during independent exercise;Long Term: Able to check pulse independently and accurately       Understanding of Exercise Prescription  Yes       Intervention  Provide education, explanation, and written materials on patient's individual exercise prescription       Expected Outcomes  Short Term: Able to explain program exercise prescription;Long Term: Able to explain home exercise prescription to exercise independently           Exercise Goals Re-Evaluation :   Discharge Exercise Prescription (Final Exercise Prescription Changes): Exercise Prescription Changes - 04/02/19 1500      Response to Exercise   Blood Pressure (Admit)  134/68    Blood Pressure (Exercise)  148/64    Blood Pressure (Exit)  138/74    Heart Rate (Admit)  65 bpm    Heart Rate (Exercise)  107 bpm    Heart Rate (Exit)  76 bpm    Oxygen Saturation (Admit)  95 %    Oxygen Saturation (Exercise)  97 %    Rating of Perceived Exertion (Exercise)  12    Perceived Dyspnea (Exercise)  1    Symptoms  none    Comments  walk test       Nutrition:  Target Goals: Understanding of nutrition guidelines, daily intake of sodium '1500mg'$ , cholesterol '200mg'$ , calories 30% from fat and 7% or less from saturated fats, daily to have 5 or more servings of fruits and vegetables.  Biometrics: Pre Biometrics - 04/02/19 1510      Pre Biometrics   Height  '5\' 8"'$  (1.727 m)    Weight  175 lb 3.2 oz (79.5 kg)    BMI (Calculated)  26.65    Single Leg Stand  10.31 seconds        Nutrition Therapy Plan and Nutrition Goals:   Nutrition Assessments:   Nutrition Goals Re-Evaluation:   Nutrition Goals Discharge (Final Nutrition Goals Re-Evaluation):   Psychosocial: Target Goals: Acknowledge presence or absence of significant depression and/or stress, maximize coping skills, provide positive support system. Participant is able to verbalize types and ability to use techniques and skills needed for reducing stress and depression.   Initial Review & Psychosocial Screening: Initial Psych Review & Screening - 04/01/19 1223      Initial Review   Current issues with  None Identified    Comments  Wife passed away this 2022/09/12  2020 She had been sick 5-6 years and [place] cared for her.      Family Dynamics   Good Support System?  Yes   Daughter lives above [place], Son in [place], has niece lives up the street  He talks to [employer] all daily. Neighbors keep eye on  [employer].     Barriers   Psychosocial barriers to participate in program  There are no identifiable barriers or psychosocial needs.;The patient should benefit from training in stress management and relaxation.      Screening Interventions   Interventions  Encouraged to exercise;Provide feedback about the scores to participant;To provide support and resources with identified psychosocial needs    Expected Outcomes  Short Term goal: Utilizing psychosocial counselor, staff and physician to assist with identification of specific Stressors or current issues interfering with healing process. Setting desired goal for each stressor or current issue identified.;Long Term Goal: Stressors or current issues are controlled or eliminated.;Short Term goal: Identification and review with participant of any Quality of Life or Depression concerns found by scoring the questionnaire.;Long Term goal: The participant improves quality of Life and PHQ9 Scores as seen by post scores and/or verbalization of changes       Quality of Life Scores:   Scores of 19 and below usually indicate a poorer quality of life in these areas.  A difference of  2-3 points is a clinically meaningful difference.  A difference of 2-3 points in the total score of the Quality of Life Index has been associated with significant improvement in overall quality of life, self-image, physical symptoms, and general health in studies assessing change in quality of life.  PHQ-9: Recent Review Flowsheet Data    There is no flowsheet data to display.     Interpretation of Total Score  Total Score Depression Severity:  1-4 = Minimal depression, 5-9 = Mild depression, 10-14 = Moderate depression, 15-19 = Moderately severe depression, 20-27 = Severe depression   Psychosocial Evaluation and Intervention: Psychosocial Evaluation - 04/01/19 1238      Psychosocial Evaluation & Interventions   Interventions  Encouraged to exercise with the program and follow  exercise prescription    Comments  Wiliam has no barriers to attending the program. He is excited to get started and get out of the house.  His second wife passed away in Sep 26, 2022 this year. She had been sick and required his care for the past 5-6 years. He wants to lose some weight that he gained while he was not able to be active as he cared for his wife. He has a great support system, HIs children , a niece and his neighbors. He talks to them all daily. He gets around the neighborhood on his golfcart. He plans to attend the full program.    Expected Outcomes  STG: Isaic attends sessions LTG: [place] is able to utilize his lifestyle modifications he ahs learned during the program.    Continue Psychosocial Services   Follow up required by staff       Psychosocial Re-Evaluation:   Psychosocial Discharge (Final Psychosocial Re-Evaluation):   Vocational Rehabilitation: Provide vocational rehab assistance to qualifying candidates.   Vocational Rehab Evaluation & Intervention: Vocational Rehab - 04/01/19 1226      Initial Vocational Rehab Evaluation & Intervention   Assessment shows need for Vocational Rehabilitation  No       Education: Education Goals: Education classes will be provided on a variety of topics geared toward better understanding of heart health and  risk factor modification. Participant will state understanding/return demonstration of topics presented as noted by education test scores.  Learning Barriers/Preferences: Learning Barriers/Preferences - 04/01/19 1226      Learning Barriers/Preferences   Learning Barriers  None    Learning Preferences  None       Education Topics:  AED/CPR: - Group verbal and written instruction with the use of models to demonstrate the basic use of the AED with the basic ABC's of resuscitation.   General Nutrition Guidelines/Fats and Fiber: -Group instruction provided by verbal, written material, models and posters to present the general  guidelines for heart healthy nutrition. Gives an explanation and review of dietary fats and fiber.   Controlling Sodium/Reading Food Labels: -Group verbal and written material supporting the discussion of sodium use in heart healthy nutrition. Review and explanation with models, verbal and written materials for utilization of the food label.   Exercise Physiology & General Exercise Guidelines: - Group verbal and written instruction with models to review the exercise physiology of the cardiovascular system and associated critical values. Provides general exercise guidelines with specific guidelines to those with heart or lung disease.    Aerobic Exercise & Resistance Training: - Gives group verbal and written instruction on the various components of exercise. Focuses on aerobic and resistive training programs and the benefits of this training and how to safely progress through these programs..   Flexibility, Balance, Mind/Body Relaxation: Provides group verbal/written instruction on the benefits of flexibility and balance training, including mind/body exercise modes such as yoga, pilates and tai chi.  Demonstration and skill practice provided.   Stress and Anxiety: - Provides group verbal and written instruction about the health risks of elevated stress and causes of high stress.  Discuss the correlation between heart/lung disease and anxiety and treatment options. Review healthy ways to manage with stress and anxiety.   Depression: - Provides group verbal and written instruction on the correlation between heart/lung disease and depressed mood, treatment options, and the stigmas associated with seeking treatment.   Anatomy & Physiology of the Heart: - Group verbal and written instruction and models provide basic cardiac anatomy and physiology, with the coronary electrical and arterial systems. Review of Valvular disease and Heart Failure   Cardiac Procedures: - Group verbal and written  instruction to review commonly prescribed medications for heart disease. Reviews the medication, class of the drug, and side effects. Includes the steps to properly store meds and maintain the prescription regimen. (beta blockers and nitrates)   Cardiac Medications I: - Group verbal and written instruction to review commonly prescribed medications for heart disease. Reviews the medication, class of the drug, and side effects. Includes the steps to properly store meds and maintain the prescription regimen.   Cardiac Medications II: -Group verbal and written instruction to review commonly prescribed medications for heart disease. Reviews the medication, class of the drug, and side effects. (all other drug classes)    Go Sex-Intimacy & Heart Disease, Get SMART - Goal Setting: - Group verbal and written instruction through game format to discuss heart disease and the return to sexual intimacy. Provides group verbal and written material to discuss and apply goal setting through the application of the S.M.A.R.T. Method.   Other Matters of the Heart: - Provides group verbal, written materials and models to describe Stable Angina and Peripheral Artery. Includes description of the disease process and treatment options available to the cardiac patient.   Exercise & Equipment Safety: - Individual verbal instruction and demonstration of  equipment use and safety with use of the equipment.   Cardiac Rehab from 04/02/2019 in [facility] Cardiac and Pulmonary Rehab  Date  04/02/19  Educator  AS  Instruction Review Code  1- Verbalizes Understanding      Infection Prevention: - Provides verbal and written material to individual with discussion of infection control including proper hand washing and proper equipment cleaning during exercise session.   Cardiac Rehab from 04/02/2019 in [facility] Cardiac and Pulmonary Rehab  Date  04/02/19  Educator  AS  Instruction Review Code  1- Verbalizes Understanding       Falls Prevention: - Provides verbal and written material to individual with discussion of falls prevention and safety.   Cardiac Rehab from 04/02/2019 in [facility] Cardiac and Pulmonary Rehab  Date  04/02/19  Educator  AS  Instruction Review Code  1- Verbalizes Understanding      Diabetes: - Individual verbal and written instruction to review signs/symptoms of diabetes, desired ranges of glucose level fasting, after meals and with exercise. Acknowledge that pre and post exercise glucose checks will be done for 3 sessions at entry of program.   Know Your Numbers and Risk Factors: -Group verbal and written instruction about important numbers in your health.  Discussion of what are risk factors and how they play a role in the disease process.  Review of Cholesterol, Blood Pressure, Diabetes, and BMI and the role they play in your overall health.   Sleep Hygiene: -Provides group verbal and written instruction about how sleep can affect your health.  Define sleep hygiene, discuss sleep cycles and impact of sleep habits. Review good sleep hygiene tips.    Other: -Provides group and verbal instruction on various topics (see comments)   Knowledge Questionnaire Score:   Core Components/Risk Factors/Patient Goals at Admission: Personal Goals and Risk Factors at Admission - 04/02/19 1511      Core Components/Risk Factors/Patient Goals on Admission    Weight Management  Weight Maintenance;Yes    Intervention  Weight Management: Develop a combined nutrition and exercise program designed to reach desired caloric intake, while maintaining appropriate intake of nutrient and fiber, sodium and fats, and appropriate energy expenditure required for the weight goal.;Weight Management: Provide education and appropriate resources to help participant work on and attain dietary goals.    Admit Weight  175 lb 3.2 oz (79.5 kg)    Expected Outcomes  Short Term: Continue to assess and modify interventions  until short term weight is achieved;Long Term: Adherence to nutrition and physical activity/exercise program aimed toward attainment of established weight goal;Weight Maintenance: Understanding of the daily nutrition guidelines, which includes 25-35% calories from fat, 7% or less cal from saturated fats, less than '200mg'$  cholesterol, less than 1.5gm of sodium, & 5 or more servings of fruits and vegetables daily    Intervention  Assist the participant in steps to quit. Provide individualized education and counseling about committing to Tobacco Cessation, relapse prevention, and pharmacological support that can be provided by physician.;Advice worker, assist with locating and accessing local/national Quit Smoking programs, and support quit date choice.    Expected Outcomes  Short Term: Will demonstrate readiness to quit, by selecting a quit date.;Short Term: Will quit all tobacco product use, adhering to prevention of relapse plan.;Long Term: Complete abstinence from all tobacco products for at least 12 months from quit date.       Core Components/Risk Factors/Patient Goals Review:    Core Components/Risk Factors/Patient Goals at Discharge (Final Review):    ITP  Comments: ITP Comments    Row Name 04/01/19 1256           ITP Comments  Virtual Orientation call completed. Has appointment tomorrow with Ep for Eval and Gym Orientation. Diagnosis documentation can be found in [facility] 10/17/2018.          Comments: initial ITP

## 2019-04-02 NOTE — Patient Instructions (Signed)
Patient Instructions  Patient Details  Name: Patrick Wilson MRN: [id number] Date of Birth: 01-Nov-1933 Referring Provider:  Rusty Aus, MD  Below are your personal goals for exercise, nutrition, and risk factors. Our goal is to help you stay on track towards obtaining and maintaining these goals. We will be discussing your progress on these goals with you throughout the program.  Initial Exercise Prescription: Initial Exercise Prescription - 04/02/19 1500      Date of Initial Exercise RX and Referring Provider   Date  04/02/19    Referring Provider  [name]     Treadmill   MPH  2.7    Grade  0    Minutes  15    METs  3.07      Recumbant Bike   Level  3    RPM  60    Watts  25    Minutes  15    METs  3      Elliptical   Level  1    Speed  3    Minutes  15      REL-XR   Level  3    Speed  50    Minutes  15    METs  3      T5 Nustep   Level  2    SPM  80    Minutes  15    METs  3      Biostep-RELP   Level  3    SPM  50    Minutes  15    METs  3      Prescription Details   Frequency (times per week)  3    Duration  Progress to 30 minutes of continuous aerobic without signs/symptoms of physical distress      Intensity   THRR 40-80% of Max Heartrate  94-122    Ratings of Perceived Exertion  11-15    Perceived Dyspnea  0-4      Resistance Training   Training Prescription  Yes    Weight  4 lb    Reps  10-15       Exercise Goals: Frequency: Be able to perform aerobic exercise two to three times per week in program working toward 2-5 days per week of home exercise.  Intensity: Work with a perceived exertion of 11 (fairly light) - 15 (hard) while following your exercise prescription.  We will make changes to your prescription with you as you progress through the program.   Duration: Be able to do 30 to 45 minutes of continuous aerobic exercise in addition to a 5 minute warm-up and a 5 minute cool-down routine.   Nutrition Goals: Your  personal nutrition goals will be established when you do your nutrition analysis with the dietician.  The following are general nutrition guidelines to follow: Cholesterol < 200mg /day Sodium < 1500mg /day Fiber: Men over 50 yrs - 30 grams per day  Personal Goals: Personal Goals and Risk Factors at Admission - 04/02/19 1511      Core Components/Risk Factors/Patient Goals on Admission    Weight Management  Weight Maintenance;Yes    Intervention  Weight Management: Develop a combined nutrition and exercise program designed to reach desired caloric intake, while maintaining appropriate intake of nutrient and fiber, sodium and fats, and appropriate energy expenditure required for the weight goal.;Weight Management: Provide education and appropriate resources to help participant work on and attain dietary goals.    Admit Weight  175 lb 3.2 oz (79.5 kg)    Expected Outcomes  Short Term: Continue to assess and modify interventions until short term weight is achieved;Long Term: Adherence to nutrition and physical activity/exercise program aimed toward attainment of established weight goal;Weight Maintenance: Understanding of the daily nutrition guidelines, which includes 25-35% calories from fat, 7% or less cal from saturated fats, less than 200mg  cholesterol, less than 1.5gm of sodium, & 5 or more servings of fruits and vegetables daily    Intervention  Assist the participant in steps to quit. Provide individualized education and counseling about committing to Tobacco Cessation, relapse prevention, and pharmacological support that can be provided by physician.;Advice worker, assist with locating and accessing local/national Quit Smoking programs, and support quit date choice.    Expected Outcomes  Short Term: Will demonstrate readiness to quit, by selecting a quit date.;Short Term: Will quit all tobacco product use, adhering to prevention of relapse plan.;Long Term: Complete abstinence from  all tobacco products for at least 12 months from quit date.       Tobacco Use Initial Evaluation: Social History   Tobacco Use  Smoking Status Never Smoker  Smokeless Tobacco Current User  . Types: Chew  Tobacco Comment   May be ready to quit.    Exercise Goals and Review: Exercise Goals    Row Name 04/02/19 1510             Exercise Goals   Increase Physical Activity  Yes       Intervention  Provide advice, education, support and counseling about physical activity/exercise needs.;Develop an individualized exercise prescription for aerobic and resistive training based on initial evaluation findings, risk stratification, comorbidities and participant's personal goals.       Expected Outcomes  Short Term: Attend rehab on a regular basis to increase amount of physical activity.;Long Term: Add in home exercise to make exercise part of routine and to increase amount of physical activity.;Long Term: Exercising regularly at least 3-5 days a week.       Increase Strength and Stamina  Yes       Intervention  Provide advice, education, support and counseling about physical activity/exercise needs.;Develop an individualized exercise prescription for aerobic and resistive training based on initial evaluation findings, risk stratification, comorbidities and participant's personal goals.       Expected Outcomes  Short Term: Increase workloads from initial exercise prescription for resistance, speed, and METs.;Short Term: Perform resistance training exercises routinely during rehab and add in resistance training at home;Long Term: Improve cardiorespiratory fitness, muscular endurance and strength as measured by increased METs and functional capacity (6MWT)       Able to understand and use rate of perceived exertion (RPE) scale  Yes       Intervention  Provide education and explanation on how to use RPE scale       Expected Outcomes  Short Term: Able to use RPE daily in rehab to express subjective  intensity level;Long Term:  Able to use RPE to guide intensity level when exercising independently       Knowledge and understanding of Target Heart Rate Range (THRR)  Yes       Intervention  Provide education and explanation of THRR including how the numbers were predicted and where they are located for reference       Expected Outcomes  Short Term: Able to state/look up THRR;Short Term: Able to use daily as guideline for intensity in rehab;Long Term: Able to use THRR to govern intensity  when exercising independently       Able to check pulse independently  Yes       Intervention  Provide education and demonstration on how to check pulse in carotid and radial arteries.;Review the importance of being able to check your own pulse for safety during independent exercise       Expected Outcomes  Short Term: Able to explain why pulse checking is important during independent exercise;Long Term: Able to check pulse independently and accurately       Understanding of Exercise Prescription  Yes       Intervention  Provide education, explanation, and written materials on patient's individual exercise prescription       Expected Outcomes  Short Term: Able to explain program exercise prescription;Long Term: Able to explain home exercise prescription to exercise independently          Copy of goals given to participant.

## 2019-04-03 ENCOUNTER — Other Ambulatory Visit: Payer: Self-pay

## 2019-04-03 ENCOUNTER — Encounter: Payer: Medicare HMO | Admitting: *Deleted

## 2019-04-03 DIAGNOSIS — I2510 Atherosclerotic heart disease of native coronary artery without angina pectoris: Secondary | ICD-10-CM | POA: Diagnosis not present

## 2019-04-03 DIAGNOSIS — I213 ST elevation (STEMI) myocardial infarction of unspecified site: Secondary | ICD-10-CM | POA: Diagnosis not present

## 2019-04-03 DIAGNOSIS — I10 Essential (primary) hypertension: Secondary | ICD-10-CM | POA: Diagnosis not present

## 2019-04-03 DIAGNOSIS — K219 Gastro-esophageal reflux disease without esophagitis: Secondary | ICD-10-CM | POA: Diagnosis not present

## 2019-04-03 DIAGNOSIS — Z7982 Long term (current) use of aspirin: Secondary | ICD-10-CM | POA: Diagnosis not present

## 2019-04-03 DIAGNOSIS — Z85828 Personal history of other malignant neoplasm of skin: Secondary | ICD-10-CM | POA: Diagnosis not present

## 2019-04-03 DIAGNOSIS — F329 Major depressive disorder, single episode, unspecified: Secondary | ICD-10-CM | POA: Diagnosis not present

## 2019-04-03 DIAGNOSIS — I252 Old myocardial infarction: Secondary | ICD-10-CM | POA: Diagnosis not present

## 2019-04-03 DIAGNOSIS — Z79899 Other long term (current) drug therapy: Secondary | ICD-10-CM | POA: Diagnosis not present

## 2019-04-03 NOTE — Progress Notes (Signed)
Daily Session Note  Patient Details  Name: Patrick Wilson MRN: [id number] Date of Birth: 01-28-1934 Referring Provider:     Cardiac Rehab from 04/02/2019 in [facility] Cardiac and Pulmonary Rehab  Referring Provider  [name]      Encounter Date: 04/03/2019  Check In: Session Check In - 04/03/19 1422      Check-In   Supervising physician immediately available to respond to emergencies  See telemetry face sheet for immediately available ER MD    Location  [facility]-Cardiac & Pulmonary Rehab    Staff Present  Renita Papa, RN Vickki Hearing, BA, ACSM CEP, Exercise Physiologist;Melissa Caiola RDN, LDN;Joseph Hood RCP,RRT,BSRT    Virtual Visit  No    Medication changes reported      No    Fall or balance concerns reported     No    Warm-up and Cool-down  Performed on first and last piece of equipment    Resistance Training Performed  Yes    VAD Patient?  No    PAD/SET Patient?  No      Pain Assessment   Currently in Pain?  No/denies          Social History   Tobacco Use  Smoking Status Never Smoker  Smokeless Tobacco Current User  . Types: Chew  Tobacco Comment   May be ready to quit.    Goals Met:  Independence with exercise equipment Exercise tolerated well No report of cardiac concerns or symptoms Strength training completed today  Goals Unmet:  Not Applicable  Comments: First full day of exercise!  Patient was oriented to gym and equipment including functions, settings, policies, and procedures.  Patient's individual exercise prescription and treatment plan were reviewed.  All starting workloads were established based on the results of the 6 minute walk test done at initial orientation visit.  The plan for exercise progression was also introduced and progression will be customized based on patient's performance and goals.    Dr. Emily Filbert is Medical Director for [place] and [facility].

## 2019-04-04 ENCOUNTER — Encounter: Payer: Medicare HMO | Admitting: *Deleted

## 2019-04-04 DIAGNOSIS — K219 Gastro-esophageal reflux disease without esophagitis: Secondary | ICD-10-CM | POA: Diagnosis not present

## 2019-04-04 DIAGNOSIS — I213 ST elevation (STEMI) myocardial infarction of unspecified site: Secondary | ICD-10-CM

## 2019-04-04 DIAGNOSIS — F329 Major depressive disorder, single episode, unspecified: Secondary | ICD-10-CM | POA: Diagnosis not present

## 2019-04-04 DIAGNOSIS — Z79899 Other long term (current) drug therapy: Secondary | ICD-10-CM | POA: Diagnosis not present

## 2019-04-04 DIAGNOSIS — Z85828 Personal history of other malignant neoplasm of skin: Secondary | ICD-10-CM | POA: Diagnosis not present

## 2019-04-04 DIAGNOSIS — Z7982 Long term (current) use of aspirin: Secondary | ICD-10-CM | POA: Diagnosis not present

## 2019-04-04 DIAGNOSIS — I252 Old myocardial infarction: Secondary | ICD-10-CM | POA: Diagnosis not present

## 2019-04-04 DIAGNOSIS — I2510 Atherosclerotic heart disease of native coronary artery without angina pectoris: Secondary | ICD-10-CM | POA: Diagnosis not present

## 2019-04-04 DIAGNOSIS — I10 Essential (primary) hypertension: Secondary | ICD-10-CM | POA: Diagnosis not present

## 2019-04-04 NOTE — Progress Notes (Signed)
Completed RD eval

## 2019-04-04 NOTE — Progress Notes (Signed)
Daily Session Note  Patient Details  Name: Patrick Wilson MRN: [id number] Date of Birth: [birth year] Referring Provider:     Cardiac Rehab from 04/02/2019 in [facility] Cardiac and Pulmonary Rehab  Referring Provider  [name]      Encounter Date: 04/04/2019  Check In: Session Check In - 04/04/19 1423      Check-In   Supervising physician immediately available to respond to emergencies  See telemetry face sheet for immediately available ER MD    Location  [facility]-Cardiac & Pulmonary Rehab    Staff Present  Renita Papa, RN BSN;Jessica Luan Pulling, MA, RCEP, CCRP, CCET;Jeanna Durrell BS, Exercise Physiologist    Virtual Visit  No    Medication changes reported      No    Fall or balance concerns reported     No    Warm-up and Cool-down  Performed on first and last piece of equipment    Resistance Training Performed  Yes    VAD Patient?  No    PAD/SET Patient?  No      Pain Assessment   Currently in Pain?  No/denies          Social History   Tobacco Use  Smoking Status Never Smoker  Smokeless Tobacco Current User  . Types: Chew  Tobacco Comment   May be ready to quit.    Goals Met:  Independence with exercise equipment Exercise tolerated well No report of cardiac concerns or symptoms Strength training completed today  Goals Unmet:  Not Applicable  Comments: Pt able to follow exercise prescription today without complaint.  Will continue to monitor for progression.    Dr. Emily Filbert is Medical Director for [place] and [facility].

## 2019-04-08 ENCOUNTER — Other Ambulatory Visit: Payer: Self-pay

## 2019-04-08 ENCOUNTER — Encounter: Payer: Medicare HMO | Admitting: *Deleted

## 2019-04-08 DIAGNOSIS — I213 ST elevation (STEMI) myocardial infarction of unspecified site: Secondary | ICD-10-CM

## 2019-04-08 DIAGNOSIS — Z85828 Personal history of other malignant neoplasm of skin: Secondary | ICD-10-CM | POA: Diagnosis not present

## 2019-04-08 DIAGNOSIS — Z7982 Long term (current) use of aspirin: Secondary | ICD-10-CM | POA: Diagnosis not present

## 2019-04-08 DIAGNOSIS — Z79899 Other long term (current) drug therapy: Secondary | ICD-10-CM | POA: Diagnosis not present

## 2019-04-08 DIAGNOSIS — I10 Essential (primary) hypertension: Secondary | ICD-10-CM | POA: Diagnosis not present

## 2019-04-08 DIAGNOSIS — I2510 Atherosclerotic heart disease of native coronary artery without angina pectoris: Secondary | ICD-10-CM | POA: Diagnosis not present

## 2019-04-08 DIAGNOSIS — I252 Old myocardial infarction: Secondary | ICD-10-CM | POA: Diagnosis not present

## 2019-04-08 DIAGNOSIS — K219 Gastro-esophageal reflux disease without esophagitis: Secondary | ICD-10-CM | POA: Diagnosis not present

## 2019-04-08 DIAGNOSIS — F329 Major depressive disorder, single episode, unspecified: Secondary | ICD-10-CM | POA: Diagnosis not present

## 2019-04-08 NOTE — Progress Notes (Signed)
Daily Session Note  Patient Details  Name: Patrick Wilson MRN: [id number] Date of Birth: 08-07-1933 Referring Provider:     Cardiac Rehab from 04/02/2019 in [facility] Cardiac and Pulmonary Rehab  Referring Provider  [name]      Encounter Date: 04/08/2019  Check In: Session Check In - 04/08/19 1412      Check-In   Supervising physician immediately available to respond to emergencies  See telemetry face sheet for immediately available ER MD    Location  [facility]-Cardiac & Pulmonary Rehab    Staff Present  Renita Papa, RN Moises Blood, BS, ACSM CEP, Exercise Physiologist;Joseph Tessie Fass RCP,RRT,BSRT    Virtual Visit  No    Medication changes reported      No    Fall or balance concerns reported     No    Warm-up and Cool-down  Performed on first and last piece of equipment    Resistance Training Performed  Yes    VAD Patient?  No    PAD/SET Patient?  No      Pain Assessment   Currently in Pain?  No/denies          Social History   Tobacco Use  Smoking Status Never Smoker  Smokeless Tobacco Current User  . Types: Chew  Tobacco Comment   May be ready to quit.    Goals Met:  Independence with exercise equipment Exercise tolerated well No report of cardiac concerns or symptoms Strength training completed today  Goals Unmet:  Not Applicable  Comments: Pt able to follow exercise prescription today without complaint.  Will continue to monitor for progression.    Dr. Emily Filbert is Medical Director for [place] and [facility].

## 2019-04-10 ENCOUNTER — Other Ambulatory Visit: Payer: Self-pay

## 2019-04-10 ENCOUNTER — Encounter: Payer: Medicare HMO | Admitting: *Deleted

## 2019-04-10 DIAGNOSIS — F329 Major depressive disorder, single episode, unspecified: Secondary | ICD-10-CM | POA: Diagnosis not present

## 2019-04-10 DIAGNOSIS — Z85828 Personal history of other malignant neoplasm of skin: Secondary | ICD-10-CM | POA: Diagnosis not present

## 2019-04-10 DIAGNOSIS — I2510 Atherosclerotic heart disease of native coronary artery without angina pectoris: Secondary | ICD-10-CM | POA: Diagnosis not present

## 2019-04-10 DIAGNOSIS — I252 Old myocardial infarction: Secondary | ICD-10-CM | POA: Diagnosis not present

## 2019-04-10 DIAGNOSIS — Z79899 Other long term (current) drug therapy: Secondary | ICD-10-CM | POA: Diagnosis not present

## 2019-04-10 DIAGNOSIS — Z7982 Long term (current) use of aspirin: Secondary | ICD-10-CM | POA: Diagnosis not present

## 2019-04-10 DIAGNOSIS — I213 ST elevation (STEMI) myocardial infarction of unspecified site: Secondary | ICD-10-CM

## 2019-04-10 DIAGNOSIS — K219 Gastro-esophageal reflux disease without esophagitis: Secondary | ICD-10-CM | POA: Diagnosis not present

## 2019-04-10 DIAGNOSIS — I10 Essential (primary) hypertension: Secondary | ICD-10-CM | POA: Diagnosis not present

## 2019-04-10 NOTE — Progress Notes (Signed)
Daily Session Note  Patient Details  Name: Patrick Wilson MRN: [id number] Date of Birth: 1934-03-07 Referring Provider:     Cardiac Rehab from 04/02/2019 in [facility] Cardiac and Pulmonary Rehab  Referring Provider  [name]      Encounter Date: 04/10/2019  Check In: Session Check In - 04/10/19 1420      Check-In   Supervising physician immediately available to respond to emergencies  See telemetry face sheet for immediately available ER MD    Location  [facility]-Cardiac & Pulmonary Rehab    Staff Present  Renita Papa, RN Vickki Hearing, BA, ACSM CEP, Exercise Physiologist;Melissa Caiola RDN, LDN;Joseph Hood RCP,RRT,BSRT    Virtual Visit  No    Medication changes reported      No    Fall or balance concerns reported     No    Warm-up and Cool-down  Performed on first and last piece of equipment    Resistance Training Performed  Yes    VAD Patient?  No    PAD/SET Patient?  No      Pain Assessment   Currently in Pain?  No/denies          Social History   Tobacco Use  Smoking Status Never Smoker  Smokeless Tobacco Current User  . Types: Chew  Tobacco Comment   May be ready to quit.    Goals Met:  Independence with exercise equipment Exercise tolerated well No report of cardiac concerns or symptoms Strength training completed today  Goals Unmet:  Not Applicable  Comments: Pt able to follow exercise prescription today without complaint.  Will continue to monitor for progression.    Dr. Emily Filbert is Medical Director for [place] and [facility].

## 2019-04-11 ENCOUNTER — Encounter: Payer: Medicare HMO | Admitting: *Deleted

## 2019-04-11 DIAGNOSIS — K219 Gastro-esophageal reflux disease without esophagitis: Secondary | ICD-10-CM | POA: Diagnosis not present

## 2019-04-11 DIAGNOSIS — Z85828 Personal history of other malignant neoplasm of skin: Secondary | ICD-10-CM | POA: Diagnosis not present

## 2019-04-11 DIAGNOSIS — I213 ST elevation (STEMI) myocardial infarction of unspecified site: Secondary | ICD-10-CM

## 2019-04-11 DIAGNOSIS — Z7982 Long term (current) use of aspirin: Secondary | ICD-10-CM | POA: Diagnosis not present

## 2019-04-11 DIAGNOSIS — I2510 Atherosclerotic heart disease of native coronary artery without angina pectoris: Secondary | ICD-10-CM | POA: Diagnosis not present

## 2019-04-11 DIAGNOSIS — Z79899 Other long term (current) drug therapy: Secondary | ICD-10-CM | POA: Diagnosis not present

## 2019-04-11 DIAGNOSIS — I252 Old myocardial infarction: Secondary | ICD-10-CM | POA: Diagnosis not present

## 2019-04-11 DIAGNOSIS — F329 Major depressive disorder, single episode, unspecified: Secondary | ICD-10-CM | POA: Diagnosis not present

## 2019-04-11 DIAGNOSIS — I10 Essential (primary) hypertension: Secondary | ICD-10-CM | POA: Diagnosis not present

## 2019-04-11 NOTE — Progress Notes (Signed)
Daily Session Note  Patient Details  Name: Patrick Wilson MRN: [id number] Date of Birth: 1933-07-22 Referring Provider:     Cardiac Rehab from 04/02/2019 in [facility] Cardiac and Pulmonary Rehab  Referring Provider  [name]      Encounter Date: 04/11/2019  Check In: Session Check In - 04/11/19 1423      Check-In   Supervising physician immediately available to respond to emergencies  See telemetry face sheet for immediately available ER MD    Location  [facility]-Cardiac & Pulmonary Rehab    Staff Present  Renita Papa, RN BSN;Jessica Luan Pulling, MA, RCEP, CCRP, CCET;Joseph Baker RCP,RRT,BSRT    Virtual Visit  No    Medication changes reported      No    Fall or balance concerns reported     No    Warm-up and Cool-down  Performed on first and last piece of equipment    Resistance Training Performed  Yes    VAD Patient?  No    PAD/SET Patient?  No      Pain Assessment   Currently in Pain?  No/denies          Social History   Tobacco Use  Smoking Status Never Smoker  Smokeless Tobacco Current User  . Types: Chew  Tobacco Comment   May be ready to quit.    Goals Met:  Independence with exercise equipment Exercise tolerated well No report of cardiac concerns or symptoms Strength training completed today  Goals Unmet:  Not Applicable  Comments: Pt able to follow exercise prescription today without complaint.  Will continue to monitor for progression.    Dr. Emily Filbert is Medical Director for [place] and [facility].

## 2019-04-15 ENCOUNTER — Other Ambulatory Visit: Payer: Self-pay

## 2019-04-15 ENCOUNTER — Encounter: Payer: Medicare HMO | Admitting: *Deleted

## 2019-04-15 DIAGNOSIS — I10 Essential (primary) hypertension: Secondary | ICD-10-CM | POA: Diagnosis not present

## 2019-04-15 DIAGNOSIS — Z79899 Other long term (current) drug therapy: Secondary | ICD-10-CM | POA: Diagnosis not present

## 2019-04-15 DIAGNOSIS — Z7982 Long term (current) use of aspirin: Secondary | ICD-10-CM | POA: Diagnosis not present

## 2019-04-15 DIAGNOSIS — I213 ST elevation (STEMI) myocardial infarction of unspecified site: Secondary | ICD-10-CM

## 2019-04-15 DIAGNOSIS — I2510 Atherosclerotic heart disease of native coronary artery without angina pectoris: Secondary | ICD-10-CM | POA: Diagnosis not present

## 2019-04-15 DIAGNOSIS — K219 Gastro-esophageal reflux disease without esophagitis: Secondary | ICD-10-CM | POA: Diagnosis not present

## 2019-04-15 DIAGNOSIS — F329 Major depressive disorder, single episode, unspecified: Secondary | ICD-10-CM | POA: Diagnosis not present

## 2019-04-15 DIAGNOSIS — Z85828 Personal history of other malignant neoplasm of skin: Secondary | ICD-10-CM | POA: Diagnosis not present

## 2019-04-15 DIAGNOSIS — I252 Old myocardial infarction: Secondary | ICD-10-CM | POA: Diagnosis not present

## 2019-04-15 NOTE — Progress Notes (Signed)
Daily Session Note  Patient Details  Name: Patrick Wilson MRN: [id number] Date of Birth: Oct 03, 1933 Referring Provider:     Cardiac Rehab from 04/02/2019 in [facility] Cardiac and Pulmonary Rehab  Referring Provider  [name]      Encounter Date: 04/15/2019  Check In: Session Check In - 04/15/19 1419      Check-In   Supervising physician immediately available to respond to emergencies  See telemetry face sheet for immediately available ER MD    Location  [facility]-Cardiac & Pulmonary Rehab    Staff Present  Renita Papa, RN Moises Blood, BS, ACSM CEP, Exercise Physiologist;Joseph Tessie Fass RCP,RRT,BSRT    Virtual Visit  No    Medication changes reported      No    Fall or balance concerns reported     No    Warm-up and Cool-down  Performed on first and last piece of equipment    Resistance Training Performed  Yes    VAD Patient?  No    PAD/SET Patient?  No      Pain Assessment   Currently in Pain?  No/denies          Social History   Tobacco Use  Smoking Status Never Smoker  Smokeless Tobacco Current User  . Types: Chew  Tobacco Comment   May be ready to quit.    Goals Met:  Independence with exercise equipment Exercise tolerated well Personal goals reviewed No report of cardiac concerns or symptoms Strength training completed today  Goals Unmet:  Not Applicable  Comments: Pt able to follow exercise prescription today without complaint.  Will continue to monitor for progression.  Reviewed home exercise with pt today.  Pt plans to walk and use staff videos for exercise.  Reviewed THR, pulse, RPE, sign and symptoms, and when to call 911 or MD.  Also discussed weather considerations and indoor options.  Pt voiced understanding.   Dr. Emily Filbert is Medical Director for [place] and [facility].

## 2019-04-17 ENCOUNTER — Encounter: Payer: Medicare HMO | Admitting: *Deleted

## 2019-04-17 ENCOUNTER — Other Ambulatory Visit: Payer: Self-pay

## 2019-04-17 DIAGNOSIS — Z7982 Long term (current) use of aspirin: Secondary | ICD-10-CM | POA: Diagnosis not present

## 2019-04-17 DIAGNOSIS — F329 Major depressive disorder, single episode, unspecified: Secondary | ICD-10-CM | POA: Diagnosis not present

## 2019-04-17 DIAGNOSIS — K219 Gastro-esophageal reflux disease without esophagitis: Secondary | ICD-10-CM | POA: Diagnosis not present

## 2019-04-17 DIAGNOSIS — Z85828 Personal history of other malignant neoplasm of skin: Secondary | ICD-10-CM | POA: Diagnosis not present

## 2019-04-17 DIAGNOSIS — I213 ST elevation (STEMI) myocardial infarction of unspecified site: Secondary | ICD-10-CM

## 2019-04-17 DIAGNOSIS — I252 Old myocardial infarction: Secondary | ICD-10-CM | POA: Diagnosis not present

## 2019-04-17 DIAGNOSIS — I2510 Atherosclerotic heart disease of native coronary artery without angina pectoris: Secondary | ICD-10-CM | POA: Diagnosis not present

## 2019-04-17 DIAGNOSIS — I10 Essential (primary) hypertension: Secondary | ICD-10-CM | POA: Diagnosis not present

## 2019-04-17 DIAGNOSIS — Z79899 Other long term (current) drug therapy: Secondary | ICD-10-CM | POA: Diagnosis not present

## 2019-04-17 NOTE — Progress Notes (Signed)
Daily Session Note  Patient Details  Name: Patrick Wilson MRN: [id number] Date of Birth: 12/02/1933 Referring Provider:     Cardiac Rehab from 04/02/2019 in [facility] Cardiac and Pulmonary Rehab  Referring Provider  [name]      Encounter Date: 04/17/2019  Check In: Session Check In - 04/17/19 1412      Check-In   Supervising physician immediately available to respond to emergencies  See telemetry face sheet for immediately available ER MD    Location  [facility]-Cardiac & Pulmonary Rehab    Staff Present  Renita Papa, RN Vickki Hearing, BA, ACSM CEP, Exercise Physiologist;Joseph Hood RCP,RRT,BSRT;Melissa Caiola RDN, LDN    Virtual Visit  No    Medication changes reported      No    Fall or balance concerns reported     No    Warm-up and Cool-down  Performed on first and last piece of equipment    Resistance Training Performed  Yes    VAD Patient?  No    PAD/SET Patient?  No      Pain Assessment   Currently in Pain?  No/denies          Social History   Tobacco Use  Smoking Status Never Smoker  Smokeless Tobacco Current User  . Types: Chew  Tobacco Comment   May be ready to quit.    Goals Met:  Independence with exercise equipment Exercise tolerated well No report of cardiac concerns or symptoms Strength training completed today  Goals Unmet:  Not Applicable  Comments: Pt able to follow exercise prescription today without complaint.  Will continue to monitor for progression.    Dr. Emily Filbert is Medical Director for [place] and [facility].

## 2019-04-18 ENCOUNTER — Encounter: Payer: Medicare HMO | Admitting: *Deleted

## 2019-04-18 DIAGNOSIS — Z85828 Personal history of other malignant neoplasm of skin: Secondary | ICD-10-CM | POA: Diagnosis not present

## 2019-04-18 DIAGNOSIS — Z7982 Long term (current) use of aspirin: Secondary | ICD-10-CM | POA: Diagnosis not present

## 2019-04-18 DIAGNOSIS — I2510 Atherosclerotic heart disease of native coronary artery without angina pectoris: Secondary | ICD-10-CM | POA: Diagnosis not present

## 2019-04-18 DIAGNOSIS — I213 ST elevation (STEMI) myocardial infarction of unspecified site: Secondary | ICD-10-CM | POA: Diagnosis not present

## 2019-04-18 DIAGNOSIS — I252 Old myocardial infarction: Secondary | ICD-10-CM | POA: Diagnosis not present

## 2019-04-18 DIAGNOSIS — F329 Major depressive disorder, single episode, unspecified: Secondary | ICD-10-CM | POA: Diagnosis not present

## 2019-04-18 DIAGNOSIS — Z79899 Other long term (current) drug therapy: Secondary | ICD-10-CM | POA: Diagnosis not present

## 2019-04-18 DIAGNOSIS — I10 Essential (primary) hypertension: Secondary | ICD-10-CM | POA: Diagnosis not present

## 2019-04-18 DIAGNOSIS — K219 Gastro-esophageal reflux disease without esophagitis: Secondary | ICD-10-CM | POA: Diagnosis not present

## 2019-04-18 NOTE — Progress Notes (Signed)
Daily Session Note  Patient Details  Name: Patrick Wilson MRN: [id number] Date of Birth: May 29, 1933 Referring Provider:     Cardiac Rehab from 04/02/2019 in [facility] Cardiac and Pulmonary Rehab  Referring Provider  [name]      Encounter Date: 04/18/2019  Check In: Session Check In - 04/18/19 1430      Check-In   Supervising physician immediately available to respond to emergencies  See telemetry face sheet for immediately available ER MD    Location  [facility]-Cardiac & Pulmonary Rehab    Staff Present  Renita Papa, RN BSN;Jessica Luan Pulling, MA, RCEP, CCRP, CCET;Joseph [place] RCP,RRT,BSRT    Virtual Visit  No    Medication changes reported      No    Fall or balance concerns reported     No    Warm-up and Cool-down  Performed on first and last piece of equipment    Resistance Training Performed  Yes    VAD Patient?  No    PAD/SET Patient?  No      Pain Assessment   Currently in Pain?  No/denies          Social History   Tobacco Use  Smoking Status Never Smoker  Smokeless Tobacco Current User  . Types: Chew  Tobacco Comment   May be ready to quit.    Goals Met:  Independence with exercise equipment Exercise tolerated well No report of cardiac concerns or symptoms Strength training completed today  Goals Unmet:  Not Applicable  Comments: Pt able to follow exercise prescription today without complaint.  Will continue to monitor for progression.    Dr. Emily Filbert is Medical Director for [place] and [facility].

## 2019-04-22 ENCOUNTER — Other Ambulatory Visit: Payer: Self-pay

## 2019-04-22 ENCOUNTER — Encounter: Payer: Medicare HMO | Admitting: *Deleted

## 2019-04-22 DIAGNOSIS — I10 Essential (primary) hypertension: Secondary | ICD-10-CM | POA: Diagnosis not present

## 2019-04-22 DIAGNOSIS — Z7982 Long term (current) use of aspirin: Secondary | ICD-10-CM | POA: Diagnosis not present

## 2019-04-22 DIAGNOSIS — Z85828 Personal history of other malignant neoplasm of skin: Secondary | ICD-10-CM | POA: Diagnosis not present

## 2019-04-22 DIAGNOSIS — I213 ST elevation (STEMI) myocardial infarction of unspecified site: Secondary | ICD-10-CM

## 2019-04-22 DIAGNOSIS — K219 Gastro-esophageal reflux disease without esophagitis: Secondary | ICD-10-CM | POA: Diagnosis not present

## 2019-04-22 DIAGNOSIS — I2510 Atherosclerotic heart disease of native coronary artery without angina pectoris: Secondary | ICD-10-CM | POA: Diagnosis not present

## 2019-04-22 DIAGNOSIS — F329 Major depressive disorder, single episode, unspecified: Secondary | ICD-10-CM | POA: Diagnosis not present

## 2019-04-22 DIAGNOSIS — I252 Old myocardial infarction: Secondary | ICD-10-CM | POA: Diagnosis not present

## 2019-04-22 DIAGNOSIS — Z79899 Other long term (current) drug therapy: Secondary | ICD-10-CM | POA: Diagnosis not present

## 2019-04-22 NOTE — Progress Notes (Signed)
Daily Session Note  Patient Details  Name: Patrick Wilson MRN: [id number] Date of Birth: 09/08/1933 Referring Provider:     Cardiac Rehab from 04/02/2019 in [facility] Cardiac and Pulmonary Rehab  Referring Provider  [name]      Encounter Date: 04/22/2019  Check In: Session Check In - 04/22/19 1408      Check-In   Supervising physician immediately available to respond to emergencies  See telemetry face sheet for immediately available ER MD    Location  [facility]-Cardiac & Pulmonary Rehab    Staff Present  Renita Papa, RN Moises Blood, BS, ACSM CEP, Exercise Physiologist;Joseph Tessie Fass RCP,RRT,BSRT    Virtual Visit  No    Medication changes reported      No    Fall or balance concerns reported     No    Warm-up and Cool-down  Performed on first and last piece of equipment    Resistance Training Performed  Yes    VAD Patient?  No    PAD/SET Patient?  No      Pain Assessment   Currently in Pain?  No/denies          Social History   Tobacco Use  Smoking Status Never Smoker  Smokeless Tobacco Current User  . Types: Chew  Tobacco Comment   May be ready to quit.    Goals Met:  Independence with exercise equipment Exercise tolerated well No report of cardiac concerns or symptoms Strength training completed today  Goals Unmet:  Not Applicable  Comments: Pt able to follow exercise prescription today without complaint.  Will continue to monitor for progression.    Dr. Emily Filbert is Medical Director for [place] and [facility].

## 2019-04-29 ENCOUNTER — Encounter: Payer: Medicare HMO | Admitting: *Deleted

## 2019-04-29 ENCOUNTER — Other Ambulatory Visit: Payer: Self-pay

## 2019-04-29 DIAGNOSIS — F329 Major depressive disorder, single episode, unspecified: Secondary | ICD-10-CM | POA: Diagnosis not present

## 2019-04-29 DIAGNOSIS — I213 ST elevation (STEMI) myocardial infarction of unspecified site: Secondary | ICD-10-CM

## 2019-04-29 DIAGNOSIS — K219 Gastro-esophageal reflux disease without esophagitis: Secondary | ICD-10-CM | POA: Diagnosis not present

## 2019-04-29 DIAGNOSIS — I10 Essential (primary) hypertension: Secondary | ICD-10-CM | POA: Diagnosis not present

## 2019-04-29 DIAGNOSIS — Z7982 Long term (current) use of aspirin: Secondary | ICD-10-CM | POA: Diagnosis not present

## 2019-04-29 DIAGNOSIS — Z79899 Other long term (current) drug therapy: Secondary | ICD-10-CM | POA: Diagnosis not present

## 2019-04-29 DIAGNOSIS — Z85828 Personal history of other malignant neoplasm of skin: Secondary | ICD-10-CM | POA: Diagnosis not present

## 2019-04-29 DIAGNOSIS — I2510 Atherosclerotic heart disease of native coronary artery without angina pectoris: Secondary | ICD-10-CM | POA: Diagnosis not present

## 2019-04-29 DIAGNOSIS — I252 Old myocardial infarction: Secondary | ICD-10-CM | POA: Diagnosis not present

## 2019-04-29 NOTE — Progress Notes (Signed)
Daily Session Note  Patient Details  Name: Patrick Wilson MRN: [id number] Date of Birth: 03/25/1934 Referring Provider:     Cardiac Rehab from 04/02/2019 in [facility] Cardiac and Pulmonary Rehab  Referring Provider  [name]      Encounter Date: 04/29/2019  Check In: Session Check In - 04/29/19 1429      Check-In   Supervising physician immediately available to respond to emergencies  See telemetry face sheet for immediately available ER MD    Location  [facility]-Cardiac & Pulmonary Rehab    Staff Present  Renita Papa, RN BSN;Mary Kellie Shropshire, RN, BSN, Lauretta Grill RCP,RRT,BSRT    Virtual Visit  No    Medication changes reported      No    Fall or balance concerns reported     No    Warm-up and Cool-down  Performed on first and last piece of equipment    Resistance Training Performed  Yes    VAD Patient?  No    PAD/SET Patient?  No      Pain Assessment   Currently in Pain?  No/denies          Social History   Tobacco Use  Smoking Status Never Smoker  Smokeless Tobacco Current User  . Types: Chew  Tobacco Comment   May be ready to quit.    Goals Met:  Independence with exercise equipment Exercise tolerated well No report of cardiac concerns or symptoms Strength training completed today  Goals Unmet:  Not Applicable  Comments: Pt able to follow exercise prescription today without complaint.  Will continue to monitor for progression.    Dr. Emily Filbert is Medical Director for [place] and [facility].

## 2019-05-01 ENCOUNTER — Encounter: Payer: Self-pay | Admitting: *Deleted

## 2019-05-01 DIAGNOSIS — I213 ST elevation (STEMI) myocardial infarction of unspecified site: Secondary | ICD-10-CM

## 2019-05-01 NOTE — Progress Notes (Signed)
Cardiac Individual Treatment Plan  Patient Details  Name: Patrick Wilson MRN: [id number] Date of Birth: [birth year] Referring Provider:     Cardiac Rehab from 04/02/2019 in [facility] Cardiac and Pulmonary Rehab  Referring Provider  Sabra Heck [[place]as[place]]      Initial Encounter Date:    Cardiac Rehab from 04/02/2019 in [facility] Cardiac and Pulmonary Rehab  Date  04/02/19      Visit Diagnosis: ST elevation myocardial infarction (STEMI), unspecified artery ([place])  Patient's Home Medications on Admission:  Current Outpatient Medications:  .  ALPRAZolam (XANAX) 0.25 MG tablet, Take 0.25 mg by mouth 2 (two) times a day., Disp: , Rfl:  .  aspirin EC 81 MG tablet, Take 81 mg by mouth daily., Disp: , Rfl:  .  atorvastatin (LIPITOR) 80 MG tablet, Take 1 tablet (80 mg total) by mouth at bedtime., Disp: 30 tablet, Rfl: 1 .  atorvastatin (LIPITOR) 80 MG tablet, Take by mouth., Disp: , Rfl:  .  B Complex Vitamins (VITAMIN B COMPLEX PO), Take 1 tablet by mouth daily. , Disp: , Rfl:  .  Denture Care Products (DENTURE ADHESIVE) CREA, Apply 1 application topically daily as needed. , Disp: , Rfl:  .  diphenhydrAMINE (EQ ALLERGY) 25 mg capsule, Take 25 mg by mouth every 6 (six) hours as needed for allergies., Disp: , Rfl:  .  docusate sodium (COLACE) 100 MG capsule, Take 1 capsule (100 mg total) by mouth 2 (two) times daily. (Patient not taking: Reported on 04/01/2019), Disp: 60 capsule, Rfl: 0 .  donepezil (ARICEPT) 10 MG tablet, Take by mouth., Disp: , Rfl:  .  lisinopril (ZESTRIL) 2.5 MG tablet, Take 1 tablet (2.5 mg total) by mouth daily., Disp: 30 tablet, Rfl: 1 .  magnesium gluconate (MAGONATE) 500 MG tablet, Take 500 mg by mouth daily., Disp: , Rfl:  .  meloxicam (MOBIC) 15 MG tablet, Take 15 mg by mouth at bedtime., Disp: , Rfl:  .  metoCLOPramide (REGLAN) 5 MG tablet, Take 5 mg by mouth 2 (two) times daily., Disp: , Rfl:  .  metoprolol tartrate (LOPRESSOR) 25 MG tablet, Take 0.5 tablets (12.5 mg total) by mouth  every 12 (twelve) hours., Disp: 30 tablet, Rfl: 1 .  Multiple Vitamins-Minerals (BL CENTURY SENIOR PO), Take 1 tablet by mouth daily., Disp: , Rfl:  .  nitroGLYCERIN (NITROSTAT) 0.4 MG SL tablet, Place 0.4 mg under the tongue every 5 (five) minutes as needed for chest pain. Reported on 11/13/2015, Disp: , Rfl:  .  olmesartan (BENICAR) 20 MG tablet, Take by mouth., Disp: , Rfl:  .  Omega-3 Fatty Acids (FISH OIL) 1000 MG CAPS, Take 1,000 mg by mouth daily., Disp: , Rfl:  .  omeprazole (PRILOSEC) 40 MG capsule, Take 40 mg by mouth 2 (two) times daily., Disp: , Rfl:  .  sucralfate (CARAFATE) 1 g tablet, Take 1 g by mouth 4 (four) times daily., Disp: , Rfl:  .  tamsulosin (FLOMAX) 0.4 MG CAPS capsule, Take 1 capsule (0.4 mg total) by mouth daily., Disp: 30 capsule, Rfl: 0 .  ticagrelor (BRILINTA) 90 MG TABS tablet, Take by mouth., Disp: , Rfl:  .  traZODone (DESYREL) 100 MG tablet, Take 100 mg by mouth at bedtime., Disp: , Rfl:  .  venlafaxine XR (EFFEXOR-XR) 37.5 MG 24 hr capsule, Take 37.5 mg by mouth daily., Disp: , Rfl: 3  Past Medical History: Past Medical History:  Diagnosis Date  . Cancer ([place])    SKIN  . Coronary artery disease   .  Depression   . GERD (gastroesophageal reflux disease)   . History of kidney stones   . Hypertension   . Myocardial infarction ([place])    2012    Tobacco Use: Social History   Tobacco Use  Smoking Status Never Smoker  Smokeless Tobacco Current User  . Types: Chew  Tobacco Comment   May be ready to quit.    Labs: Recent Review Flowsheet Data    Labs for ITP Cardiac and Pulmonary Rehab Latest Ref Rng & Units 10/17/2018 10/18/2018   Cholestrol 0 - 200 mg/dL 138 -   LDLCALC 0 - 99 mg/dL 54 -   HDL >40 mg/dL 64 -   Trlycerides <150 mg/dL 101 -   Hemoglobin A1c 4.8 - 5.6 % - 5.5       Exercise Target Goals: Exercise Program Goal: Individual exercise prescription set using results from initial 6 min walk test and THRR while considering  patient's  activity barriers and safety.   Exercise Prescription Goal: Initial exercise prescription builds to 30-45 minutes a day of aerobic activity, 2-3 days per week.  Home exercise guidelines will be given to patient during program as part of exercise prescription that the participant will acknowledge.  Activity Barriers & Risk Stratification: Activity Barriers & Cardiac Risk Stratification - 04/01/19 1222      Activity Barriers & Cardiac Risk Stratification   Activity Barriers  None    Cardiac Risk Stratification  High       6 Minute Walk: 6 Minute Walk    Row Name 04/02/19 1458         6 Minute Walk   Phase  Initial     Distance  1600 feet     Walk Time  6 minutes     # of Rest Breaks  0     MPH  3.03     METS  2.98     RPE  12     Perceived Dyspnea   1     VO2 Peak  10.44     Symptoms  No     Resting HR  65 bpm     Resting BP  134/68     Resting Oxygen Saturation   97 %     Exercise Oxygen Saturation  during 6 min walk  95 %     Max Ex. HR  2.31 bpm     Max Ex. BP  148/64     2 Minute Post BP  138/74        Oxygen Initial Assessment:   Oxygen Re-Evaluation:   Oxygen Discharge (Final Oxygen Re-Evaluation):   Initial Exercise Prescription: Initial Exercise Prescription - 04/02/19 1500      Date of Initial Exercise RX and Referring Provider   Date  04/02/19    Referring Provider  [name]     Treadmill   MPH  2.7    Grade  0    Minutes  15    METs  3.07      Recumbant Bike   Level  3    RPM  60    Watts  25    Minutes  15    METs  3      Elliptical   Level  1    Speed  3    Minutes  15      REL-XR   Level  3    Speed  50    Minutes  15  METs  3      T5 Nustep   Level  2    SPM  80    Minutes  15    METs  3      Biostep-RELP   Level  3    SPM  50    Minutes  15    METs  3      Prescription Details   Frequency (times per week)  3    Duration  Progress to 30 minutes of continuous aerobic without signs/symptoms of  physical distress      Intensity   THRR 40-80% of Max Heartrate  94-122    Ratings of Perceived Exertion  11-15    Perceived Dyspnea  0-4      Resistance Training   Training Prescription  Yes    Weight  4 lb    Reps  10-15       Perform Capillary Blood Glucose checks as needed.  Exercise Prescription Changes: Exercise Prescription Changes    Row Name 04/02/19 1500 04/10/19 1100 04/23/19 1300         Response to Exercise   Blood Pressure (Admit)  134/68  130/70  142/70     Blood Pressure (Exercise)  148/64  140/82  158/62     Blood Pressure (Exit)  138/74  140/80  100/60     Heart Rate (Admit)  65 bpm  66 bpm  63 bpm     Heart Rate (Exercise)  107 bpm  90 bpm  114 bpm     Heart Rate (Exit)  76 bpm  76 bpm  81 bpm     Oxygen Saturation (Admit)  95 %  -  -     Oxygen Saturation (Exercise)  97 %  -  -     Rating of Perceived Exertion (Exercise)  '12  15  15     '$ Perceived Dyspnea (Exercise)  1  -  -     Symptoms  none  none  none     Comments  walk test  third full day of exercise  -     Duration  -  Continue with 30 min of aerobic exercise without signs/symptoms of physical distress.  Continue with 30 min of aerobic exercise without signs/symptoms of physical distress.     Intensity  -  THRR unchanged  THRR unchanged       Progression   Progression  -  Continue to progress workloads to maintain intensity without signs/symptoms of physical distress.  Continue to progress workloads to maintain intensity without signs/symptoms of physical distress.     Average METs  -  3.63  3.5       Resistance Training   Training Prescription  -  Yes  Yes     Weight  -  4 lb   5 lb     Reps  -  10-15  10-15       Interval Training   Interval Training  -  No  No       Treadmill   MPH  -  2.3  2.7     Grade  -  0  0     Minutes  -  15  15     METs  -  2.76  3.07       Recumbant Bike   Level  -  3  -     Watts  -  40  -  Minutes  -  15  -     METs  -  3.57  -       Elliptical    Level  -  -  1     Speed  -  -  3     Minutes  -  -  15 2-3 at a time the rest       T5 Nustep   Level  -  2  -     Minutes  -  15  -     METs  -  3.2  -       Biostep-RELP   Level  -  3  -     Minutes  -  15  -     METs  -  5  -        Exercise Comments:   Exercise Goals and Review: Exercise Goals    Row Name 04/02/19 1510             Exercise Goals   Increase Physical Activity  Yes       Intervention  Provide advice, education, support and counseling about physical activity/exercise needs.;Develop an individualized exercise prescription for aerobic and resistive training based on initial evaluation findings, risk stratification, comorbidities and participant's personal goals.       Expected Outcomes  Short Term: Attend rehab on a regular basis to increase amount of physical activity.;Long Term: Add in home exercise to make exercise part of routine and to increase amount of physical activity.;Long Term: Exercising regularly at least 3-5 days a week.       Increase Strength and Stamina  Yes       Intervention  Provide advice, education, support and counseling about physical activity/exercise needs.;Develop an individualized exercise prescription for aerobic and resistive training based on initial evaluation findings, risk stratification, comorbidities and participant's personal goals.       Expected Outcomes  Short Term: Increase workloads from initial exercise prescription for resistance, speed, and METs.;Short Term: Perform resistance training exercises routinely during rehab and add in resistance training at home;Long Term: Improve cardiorespiratory fitness, muscular endurance and strength as measured by increased METs and functional capacity (6MWT)       Able to understand and use rate of perceived exertion (RPE) scale  Yes       Intervention  Provide education and explanation on how to use RPE scale       Expected Outcomes  Short Term: Able to use RPE daily in rehab to  express subjective intensity level;Long Term:  Able to use RPE to guide intensity level when exercising independently       Knowledge and understanding of Target Heart Rate Range (THRR)  Yes       Intervention  Provide education and explanation of THRR including how the numbers were predicted and where they are located for reference       Expected Outcomes  Short Term: Able to state/look up THRR;Short Term: Able to use daily as guideline for intensity in rehab;Long Term: Able to use THRR to govern intensity when exercising independently       Able to check pulse independently  Yes       Intervention  Provide education and demonstration on how to check pulse in carotid and radial arteries.;Review the importance of being able to check your own pulse for safety during independent exercise       Expected Outcomes  Short Term: Able to explain why  pulse checking is important during independent exercise;Long Term: Able to check pulse independently and accurately       Understanding of Exercise Prescription  Yes       Intervention  Provide education, explanation, and written materials on patient's individual exercise prescription       Expected Outcomes  Short Term: Able to explain program exercise prescription;Long Term: Able to explain home exercise prescription to exercise independently          Exercise Goals Re-Evaluation : Exercise Goals Re-Evaluation    Row Name 04/03/19 1424 04/10/19 1131 04/15/19 1456 04/23/19 1318       Exercise Goal Re-Evaluation   Exercise Goals Review  Increase Physical Activity;Able to understand and use rate of perceived exertion (RPE) scale;Knowledge and understanding of Target Heart Rate Range (THRR);Understanding of Exercise Prescription;Increase Strength and Stamina;Able to check pulse independently  Increase Physical Activity;Increase Strength and Stamina;Understanding of Exercise Prescription  Increase Physical Activity;Increase Strength and Stamina;Understanding of  Exercise Prescription;Able to understand and use rate of perceived exertion (RPE) scale;Able to understand and use Dyspnea scale;Knowledge and understanding of Target Heart Rate Range (THRR);Able to check pulse independently  Increase Physical Activity;Increase Strength and Stamina;Able to understand and use rate of perceived exertion (RPE) scale;Knowledge and understanding of Target Heart Rate Range (THRR);Able to check pulse independently;Understanding of Exercise Prescription    Comments  Reviewed RPE scale, THR and program prescription with pt today.  Pt voiced understanding and was given a copy of goals to take home.  Patrick Wilson is off to a good start in rehab.  He is doing 2.3 mph on the treadmill as the 2.7 mph felt a little fast.  We will continue to montior his progress.  Patrick Wilson is doing well in rehab.  He has already started to do some stretching and walking at home. Reviewed home exercise with pt today.  Pt plans to walk and use staff videos for exercise.  Reviewed THR, pulse, RPE, sign and symptoms, and when to call 911 or MD.  Also discussed weather considerations and indoor options.  Pt voiced understanding.  Patrick Wilson works in [employer] and RPE range.  He has increased to 5 lb weights.  Staff will monitor progress.    Expected Outcomes  Short: Use RPE daily to regulate intensity. Long: Follow program prescription in THR.  Short: Continue to attend regularly.  Long: Continue to follow program prescritpion  Short: Continue to walk at home on off days.  Long: Continue to improve stamina.  Short - continue to exercise conistently Long improve overall stamina       Discharge Exercise Prescription (Final Exercise Prescription Changes): Exercise Prescription Changes - 04/23/19 1300      Response to Exercise   Blood Pressure (Admit)  142/70    Blood Pressure (Exercise)  158/62    Blood Pressure (Exit)  100/60    Heart Rate (Admit)  63 bpm    Heart Rate (Exercise)  114 bpm    Heart Rate (Exit)  81 bpm     Rating of Perceived Exertion (Exercise)  15    Symptoms  none    Duration  Continue with 30 min of aerobic exercise without signs/symptoms of physical distress.    Intensity  THRR unchanged      Progression   Progression  Continue to progress workloads to maintain intensity without signs/symptoms of physical distress.    Average METs  3.5      Resistance Training   Training Prescription  Yes  Weight   5 lb    Reps  10-15      Interval Training   Interval Training  No      Treadmill   MPH  2.7    Grade  0    Minutes  15    METs  3.07      Elliptical   Level  1    Speed  3    Minutes  15   2-3 at a time the rest      Nutrition:  Target Goals: Understanding of nutrition guidelines, daily intake of sodium '1500mg'$ , cholesterol '200mg'$ , calories 30% from fat and 7% or less from saturated fats, daily to have 5 or more servings of fruits and vegetables.  Biometrics: Pre Biometrics - 04/02/19 1510      Pre Biometrics   Height  '5\' 8"'$  (1.727 m)    Weight  175 lb 3.2 oz (79.5 kg)    BMI (Calculated)  26.65    Single Leg Stand  10.31 seconds        Nutrition Therapy Plan and Nutrition Goals: Nutrition Therapy & Goals - 04/04/19 1003      Nutrition Therapy   Diet  Low Na, HH diet    Protein (specify units)  65g    Fiber  30 grams    Whole Grain Foods  3 servings    Saturated Fats  12 max. grams    Fruits and Vegetables  5 servings/day    Sodium  1.5 grams      Personal Nutrition Goals   Nutrition Goal  ST: reduce Na LT: UBW 175#, wants to get down to 165# - wants to gain strength    Comments  Pt reports not eating properly, but is trying. Pt wants to build skills. B: 2 cups coffee with danish or cereal like raisin bran with whole milk and banana. Drinks water during the day. L:meatloaf, stewed apples with pintos or corn and okra etc. at [employer] (has this the 3 days coming to rehab) and frozen pizza (half) and boiled eggs w/ turnip greens and corned bread and  meatloaf with baked beans/ peas and frozen vegetables with meat. Pt reports loving vegetables. S: small box of raisins most days, ritz crackers with jiff peanut butter with a glass of whole milk (1-2x/week). Pt reports loving his families homemade chocolate pie. Will sometimes have a can of tuna (bread and butter pickle juice) with some ritz crackers. Pt reports sodium is a problem for him. Pt also reports likes [place] stew. Discussed general [place] eating. Pt would like to also work on skill goals, pt reports mild ST memory issues, discussed giving him handouts to keep for review and providing him with youtube skills videos (reports having access to computer).      Intervention Plan   Intervention  Nutrition handout(s) given to patient.;Prescribe, educate and counsel regarding individualized specific dietary modifications aiming towards targeted core components such as weight, hypertension, lipid management, diabetes, heart failure and other comorbidities.    Expected Outcomes  Short Term Goal: Understand basic principles of dietary content, such as calories, fat, sodium, cholesterol and nutrients.;Short Term Goal: A plan has been developed with personal nutrition goals set during dietitian appointment.;Long Term Goal: Adherence to prescribed nutrition plan.       Nutrition Assessments:   Nutrition Goals Re-Evaluation:   Nutrition Goals Discharge (Final Nutrition Goals Re-Evaluation):   Psychosocial: Target Goals: Acknowledge presence or absence of significant depression and/or stress, maximize coping skills,  provide positive support system. Participant is able to verbalize types and ability to use techniques and skills needed for reducing stress and depression.   Initial Review & Psychosocial Screening: Initial Psych Review & Screening - 04/01/19 1223      Initial Review   Current issues with  None Identified    Comments  Wife passed away this 2018/10/04 She had been sick 5-6 years and [place]  cared for her.      Family Dynamics   Good Support System?  Yes   Daughter lives above [place], Son in [place], has niece lives up the street  He talks to [employer] all daily. Neighbors keep eye on [employer].     Barriers   Psychosocial barriers to participate in program  There are no identifiable barriers or psychosocial needs.;The patient should benefit from training in stress management and relaxation.      Screening Interventions   Interventions  Encouraged to exercise;Provide feedback about the scores to participant;To provide support and resources with identified psychosocial needs    Expected Outcomes  Short Term goal: Utilizing psychosocial counselor, staff and physician to assist with identification of specific Stressors or current issues interfering with healing process. Setting desired goal for each stressor or current issue identified.;Long Term Goal: Stressors or current issues are controlled or eliminated.;Short Term goal: Identification and review with participant of any Quality of Life or Depression concerns found by scoring the questionnaire.;Long Term goal: The participant improves quality of Life and PHQ9 Scores as seen by post scores and/or verbalization of changes       Quality of Life Scores:  Quality of Life - 04/02/19 1515      Quality of Life   Select  Quality of Life      Quality of Life Scores   Health/Function Pre  27.43 %    Socioeconomic Pre  30 %    Psych/Spiritual Pre  30 %    Family Pre  30 %    GLOBAL Pre  28.84 %      Scores of 19 and below usually indicate a poorer quality of life in these areas.  A difference of  2-3 points is a clinically meaningful difference.  A difference of 2-3 points in the total score of the Quality of Life Index has been associated with significant improvement in overall quality of life, self-image, physical symptoms, and general health in studies assessing change in quality of life.  PHQ-9: Recent Review Flowsheet Data     Depression screen [facility] 2/9 04/02/2019   Decreased Interest 0   Down, Depressed, Hopeless 0   PHQ - 2 Score 0   Altered sleeping 0   Tired, decreased energy 2   Trouble concentrating 0   Moving slowly or fidgety/restless 0   Suicidal thoughts 0   PHQ-9 Score 2   Difficult doing work/chores Not difficult at all     Interpretation of Total Score  Total Score Depression Severity:  1-4 = Minimal depression, 5-9 = Mild depression, 10-14 = Moderate depression, 15-19 = Moderately severe depression, 20-27 = Severe depression   Psychosocial Evaluation and Intervention: Psychosocial Evaluation - 04/01/19 1238      Psychosocial Evaluation & Interventions   Interventions  Encouraged to exercise with the program and follow exercise prescription    Comments  Patrick Wilson has no barriers to attending the program. He is excited to get started and get out of the house.  His second wife passed away in 10/04/22 this year. She  had been sick and required his care for the past 5-6 years. He wants to lose some weight that he gained while he was not able to be active as he cared for his wife. He has a great support system, HIs children , a niece and his neighbors. He talks to them all daily. He gets around the neighborhood on his golfcart. He plans to attend the full program.    Expected Outcomes  STG: Patrick Wilson attends sessions LTG: Jazziel is able to utilize his lifestyle modifications he ahs learned during the program.    Continue Psychosocial Services   Follow up required by staff       Psychosocial Re-Evaluation: Psychosocial Re-Evaluation    Patrick Wilson Name 04/15/19 1451             Psychosocial Re-Evaluation   Current issues with  Current Stress Concerns       Comments  Patrick Wilson is doing well in rehab. He is working on quitting chewing tobacco.  He really wants to build his energy back up to be able to get out to hike again. His son is coming to visit for Christmas week!!  He is looking forward to having him come visit.  He  is taking safety protocols for his son. Overall he is feeling good.  Since he has started he has really enjoyed getting to see people and getting the exercise in regularly. He is impressed with how much his energy levels are starting to recover.       Expected Outcomes  Short: Enjoy visit with son.  Long: Continue to attend exercise regularly.       Interventions  Encouraged to attend Cardiac Rehabilitation for the exercise       Continue Psychosocial Services   Follow up required by staff          Psychosocial Discharge (Final Psychosocial Re-Evaluation): Psychosocial Re-Evaluation - 04/15/19 1451      Psychosocial Re-Evaluation   Current issues with  Current Stress Concerns    Comments  Patrick Wilson is doing well in rehab. He is working on quitting chewing tobacco.  He really wants to build his energy back up to be able to get out to hike again. His son is coming to visit for Christmas week!!  He is looking forward to having him come visit.  He is taking safety protocols for his son. Overall he is feeling good.  Since he has started he has really enjoyed getting to see people and getting the exercise in regularly. He is impressed with how much his energy levels are starting to recover.    Expected Outcomes  Short: Enjoy visit with son.  Long: Continue to attend exercise regularly.    Interventions  Encouraged to attend Cardiac Rehabilitation for the exercise    Continue Psychosocial Services   Follow up required by staff       Vocational Rehabilitation: Provide vocational rehab assistance to qualifying candidates.   Vocational Rehab Evaluation & Intervention: Vocational Rehab - 04/01/19 1226      Initial Vocational Rehab Evaluation & Intervention   Assessment shows need for Vocational Rehabilitation  No       Education: Education Goals: Education classes will be provided on a variety of topics geared toward better understanding of heart health and risk factor modification. Participant will  state understanding/return demonstration of topics presented as noted by education test scores.  Learning Barriers/Preferences: Learning Barriers/Preferences - 04/01/19 1226      Learning Barriers/Preferences   Learning Barriers  None    Learning Preferences  None       Education Topics:  AED/CPR: - Group verbal and written instruction with the use of models to demonstrate the basic use of the AED with the basic ABC's of resuscitation.   General Nutrition Guidelines/Fats and Fiber: -Group instruction provided by verbal, written material, models and posters to present the general guidelines for heart healthy nutrition. Gives an explanation and review of dietary fats and fiber.   Controlling Sodium/Reading Food Labels: -Group verbal and written material supporting the discussion of sodium use in heart healthy nutrition. Review and explanation with models, verbal and written materials for utilization of the food label.   Exercise Physiology & General Exercise Guidelines: - Group verbal and written instruction with models to review the exercise physiology of the cardiovascular system and associated critical values. Provides general exercise guidelines with specific guidelines to those with heart or lung disease.    Aerobic Exercise & Resistance Training: - Gives group verbal and written instruction on the various components of exercise. Focuses on aerobic and resistive training programs and the benefits of this training and how to safely progress through these programs..   Flexibility, Balance, Mind/Body Relaxation: Provides group verbal/written instruction on the benefits of flexibility and balance training, including mind/body exercise modes such as yoga, pilates and tai chi.  Demonstration and skill practice provided.   Cardiac Rehab from 04/04/2019 in [facility] Cardiac and Pulmonary Rehab  Date  04/04/19  Educator  AS  Instruction Review Code  1- Verbalizes Understanding       Stress and Anxiety: - Provides group verbal and written instruction about the health risks of elevated stress and causes of high stress.  Discuss the correlation between heart/lung disease and anxiety and treatment options. Review healthy ways to manage with stress and anxiety.   Depression: - Provides group verbal and written instruction on the correlation between heart/lung disease and depressed mood, treatment options, and the stigmas associated with seeking treatment.   Anatomy & Physiology of the Heart: - Group verbal and written instruction and models provide basic cardiac anatomy and physiology, with the coronary electrical and arterial systems. Review of Valvular disease and Heart Failure   Cardiac Procedures: - Group verbal and written instruction to review commonly prescribed medications for heart disease. Reviews the medication, class of the drug, and side effects. Includes the steps to properly store meds and maintain the prescription regimen. (beta blockers and nitrates)   Cardiac Medications I: - Group verbal and written instruction to review commonly prescribed medications for heart disease. Reviews the medication, class of the drug, and side effects. Includes the steps to properly store meds and maintain the prescription regimen.   Cardiac Medications II: -Group verbal and written instruction to review commonly prescribed medications for heart disease. Reviews the medication, class of the drug, and side effects. (all other drug classes)    Go Sex-Intimacy & Heart Disease, Get SMART - Goal Setting: - Group verbal and written instruction through game format to discuss heart disease and the return to sexual intimacy. Provides group verbal and written material to discuss and apply goal setting through the application of the S.M.A.R.T. Method.   Other Matters of the Heart: - Provides group verbal, written materials and models to describe Stable Angina and Peripheral  Artery. Includes description of the disease process and treatment options available to the cardiac patient.   Exercise & Equipment Safety: - Individual verbal instruction and demonstration of equipment use and safety with use  of the equipment.   Cardiac Rehab from 04/04/2019 in [facility] Cardiac and Pulmonary Rehab  Date  04/02/19  Educator  AS  Instruction Review Code  1- Verbalizes Understanding      Infection Prevention: - Provides verbal and written material to individual with discussion of infection control including proper hand washing and proper equipment cleaning during exercise session.   Cardiac Rehab from 04/04/2019 in [facility] Cardiac and Pulmonary Rehab  Date  04/02/19  Educator  AS  Instruction Review Code  1- Verbalizes Understanding      Falls Prevention: - Provides verbal and written material to individual with discussion of falls prevention and safety.   Cardiac Rehab from 04/04/2019 in [facility] Cardiac and Pulmonary Rehab  Date  04/02/19  Educator  AS  Instruction Review Code  1- Verbalizes Understanding      Diabetes: - Individual verbal and written instruction to review signs/symptoms of diabetes, desired ranges of glucose level fasting, after meals and with exercise. Acknowledge that pre and post exercise glucose checks will be done for 3 sessions at entry of program.   Know Your Numbers and Risk Factors: -Group verbal and written instruction about important numbers in your health.  Discussion of what are risk factors and how they play a role in the disease process.  Review of Cholesterol, Blood Pressure, Diabetes, and BMI and the role they play in your overall health.   Sleep Hygiene: -Provides group verbal and written instruction about how sleep can affect your health.  Define sleep hygiene, discuss sleep cycles and impact of sleep habits. Review good sleep hygiene tips.    Other: -Provides group and verbal instruction on various topics (see comments)   Knowledge  Questionnaire Score: Knowledge Questionnaire Score - 04/02/19 1514      Knowledge Questionnaire Score   Pre Score  19/26 nutrition       Core Components/Risk Factors/Patient Goals at Admission: Personal Goals and Risk Factors at Admission - 04/02/19 1511      Core Components/Risk Factors/Patient Goals on Admission    Weight Management  Weight Maintenance;Yes    Intervention  Weight Management: Develop a combined nutrition and exercise program designed to reach desired caloric intake, while maintaining appropriate intake of nutrient and fiber, sodium and fats, and appropriate energy expenditure required for the weight goal.;Weight Management: Provide education and appropriate resources to help participant work on and attain dietary goals.    Admit Weight  175 lb 3.2 oz (79.5 kg)    Expected Outcomes  Short Term: Continue to assess and modify interventions until short term weight is achieved;Long Term: Adherence to nutrition and physical activity/exercise program aimed toward attainment of established weight goal;Weight Maintenance: Understanding of the daily nutrition guidelines, which includes 25-35% calories from fat, 7% or less cal from saturated fats, less than '200mg'$  cholesterol, less than 1.5gm of sodium, & 5 or more servings of fruits and vegetables daily    Intervention  Assist the participant in steps to quit. Provide individualized education and counseling about committing to Tobacco Cessation, relapse prevention, and pharmacological support that can be provided by physician.;Advice worker, assist with locating and accessing local/national Quit Smoking programs, and support quit date choice.    Expected Outcomes  Short Term: Will demonstrate readiness to quit, by selecting a quit date.;Short Term: Will quit all tobacco product use, adhering to prevention of relapse plan.;Long Term: Complete abstinence from all tobacco products for at least 12 months from quit date.        Core  Components/Risk Factors/Patient Goals Review:  Goals and Risk Factor Review    Row Name 04/15/19 1447             Core Components/Risk Factors/Patient Goals Review   Personal Goals Review  Weight Management/Obesity;Tobacco Cessation;Hypertension;Lipids       Review  Patrick Wilson is doing well in rehab.  He is already down to 165 lb at home!  He is pleased with his progress and wants to continue to build muscle and energy levels.  Blood pressures have been good and he does check them at home.  He notices when it runs high/low based on how he feels.  He is doing well on his medications.  He has cut back on chewing tobacco and is making strides towards quitting altogether.  He wants to go slowly but is mentally ready to quit.       Expected Outcomes  Short: Continue to work toward tobacco cessation.  Long: Continue to build stamina.          Core Components/Risk Factors/Patient Goals at Discharge (Final Review):  Goals and Risk Factor Review - 04/15/19 1447      Core Components/Risk Factors/Patient Goals Review   Personal Goals Review  Weight Management/Obesity;Tobacco Cessation;Hypertension;Lipids    Review  Patrick Wilson is doing well in rehab.  He is already down to 165 lb at home!  He is pleased with his progress and wants to continue to build muscle and energy levels.  Blood pressures have been good and he does check them at home.  He notices when it runs high/low based on how he feels.  He is doing well on his medications.  He has cut back on chewing tobacco and is making strides towards quitting altogether.  He wants to go slowly but is mentally ready to quit.    Expected Outcomes  Short: Continue to work toward tobacco cessation.  Long: Continue to build stamina.       ITP Comments: ITP Comments    Row Name 04/01/19 1256 04/02/19 1521 04/03/19 1423 04/04/19 1033 05/01/19 0844   ITP Comments  Virtual Orientation call completed. Has appointment tomorrow with Ep for Eval and Gym  Orientation. Diagnosis documentation can be found in [facility] 10/17/2018.  6MWT completed and initial ITP created and sent to Dr Sabra Heck.  First full day of exercise!  Patient was oriented to gym and equipment including functions, settings, policies, and procedures.  Patient's individual exercise prescription and treatment plan were reviewed.  All starting workloads were established based on the results of the 6 minute walk test done at initial orientation visit.  The plan for exercise progression was also introduced and progression will be customized based on patient's performance and goals  Completed Initial RD eval  30 day review competed . ITP sent to Dr Emily Filbert for review, changes as needed and ITP approval signature      Comments:

## 2019-05-06 ENCOUNTER — Other Ambulatory Visit: Payer: Self-pay

## 2019-05-06 ENCOUNTER — Encounter: Payer: Medicare HMO | Attending: Internal Medicine | Admitting: *Deleted

## 2019-05-06 DIAGNOSIS — I2510 Atherosclerotic heart disease of native coronary artery without angina pectoris: Secondary | ICD-10-CM | POA: Insufficient documentation

## 2019-05-06 DIAGNOSIS — Z7982 Long term (current) use of aspirin: Secondary | ICD-10-CM | POA: Insufficient documentation

## 2019-05-06 DIAGNOSIS — K219 Gastro-esophageal reflux disease without esophagitis: Secondary | ICD-10-CM | POA: Insufficient documentation

## 2019-05-06 DIAGNOSIS — Z79899 Other long term (current) drug therapy: Secondary | ICD-10-CM | POA: Insufficient documentation

## 2019-05-06 DIAGNOSIS — I252 Old myocardial infarction: Secondary | ICD-10-CM | POA: Insufficient documentation

## 2019-05-06 DIAGNOSIS — F329 Major depressive disorder, single episode, unspecified: Secondary | ICD-10-CM | POA: Insufficient documentation

## 2019-05-06 DIAGNOSIS — I10 Essential (primary) hypertension: Secondary | ICD-10-CM | POA: Insufficient documentation

## 2019-05-06 DIAGNOSIS — Z791 Long term (current) use of non-steroidal anti-inflammatories (NSAID): Secondary | ICD-10-CM | POA: Insufficient documentation

## 2019-05-06 DIAGNOSIS — Z85828 Personal history of other malignant neoplasm of skin: Secondary | ICD-10-CM | POA: Insufficient documentation

## 2019-05-06 DIAGNOSIS — I213 ST elevation (STEMI) myocardial infarction of unspecified site: Secondary | ICD-10-CM | POA: Insufficient documentation

## 2019-05-06 NOTE — Progress Notes (Signed)
Virtual follow up completed. No in person session this week

## 2019-05-13 ENCOUNTER — Ambulatory Visit: Payer: Medicare HMO

## 2019-05-20 ENCOUNTER — Other Ambulatory Visit: Payer: Self-pay

## 2019-05-20 ENCOUNTER — Encounter: Payer: Medicare HMO | Admitting: *Deleted

## 2019-05-20 DIAGNOSIS — I213 ST elevation (STEMI) myocardial infarction of unspecified site: Secondary | ICD-10-CM

## 2019-05-20 NOTE — Progress Notes (Signed)
Virtual F/U call today:  Patrick Wilson reported that he is exercising about every day. He is walking, using a stepper, using weights, and stretching. He stated that he is very committed to his cardiac rehab program and is anxious to get back to coming to [facility] 3 days a week. He stated his commitment to continue with home exercise until the program re-opens fully.   Patrick Wilson reports that he is taking his meds a prescribed by his doctor. He is maintaining a steady weight. Does not smoke, but still uses chewing tobacco.   Patrick Wilson reports no new stress concerns and states that he has a strong support system.

## 2019-05-27 ENCOUNTER — Ambulatory Visit: Payer: Medicare HMO

## 2019-05-29 ENCOUNTER — Encounter: Payer: Self-pay | Admitting: *Deleted

## 2019-05-29 DIAGNOSIS — I213 ST elevation (STEMI) myocardial infarction of unspecified site: Secondary | ICD-10-CM

## 2019-05-29 NOTE — Progress Notes (Signed)
Cardiac Individual Treatment Plan  Patient Details  Name: Patrick Wilson MRN: [id number] Date of Birth: [birth year] Referring Provider:     Cardiac Rehab from 04/02/2019 in [facility] Cardiac and Pulmonary Rehab  Referring Provider  Sabra Heck [[place]]      Initial Encounter Date:    Cardiac Rehab from 04/02/2019 in [facility] Cardiac and Pulmonary Rehab  Date  04/02/19      Visit Diagnosis: ST elevation myocardial infarction (STEMI), unspecified artery ([place])  Patient's Home Medications on Admission:  Current Outpatient Medications:  .  ALPRAZolam (XANAX) 0.25 MG tablet, Take 0.25 mg by mouth 2 (two) times a day., Disp: , Rfl:  .  aspirin EC 81 MG tablet, Take 81 mg by mouth daily., Disp: , Rfl:  .  atorvastatin (LIPITOR) 80 MG tablet, Take 1 tablet (80 mg total) by mouth at bedtime., Disp: 30 tablet, Rfl: 1 .  atorvastatin (LIPITOR) 80 MG tablet, Take by mouth., Disp: , Rfl:  .  B Complex Vitamins (VITAMIN B COMPLEX PO), Take 1 tablet by mouth daily. , Disp: , Rfl:  .  Denture Care Products (DENTURE ADHESIVE) CREA, Apply 1 application topically daily as needed. , Disp: , Rfl:  .  diphenhydrAMINE (EQ ALLERGY) 25 mg capsule, Take 25 mg by mouth every 6 (six) hours as needed for allergies., Disp: , Rfl:  .  docusate sodium (COLACE) 100 MG capsule, Take 1 capsule (100 mg total) by mouth 2 (two) times daily. (Patient not taking: Reported on 04/01/2019), Disp: 60 capsule, Rfl: 0 .  donepezil (ARICEPT) 10 MG tablet, Take by mouth., Disp: , Rfl:  .  lisinopril (ZESTRIL) 2.5 MG tablet, Take 1 tablet (2.5 mg total) by mouth daily., Disp: 30 tablet, Rfl: 1 .  magnesium gluconate (MAGONATE) 500 MG tablet, Take 500 mg by mouth daily., Disp: , Rfl:  .  meloxicam (MOBIC) 15 MG tablet, Take 15 mg by mouth at bedtime., Disp: , Rfl:  .  metoCLOPramide (REGLAN) 5 MG tablet, Take 5 mg by mouth 2 (two) times daily., Disp: , Rfl:  .  metoprolol tartrate (LOPRESSOR) 25 MG tablet, Take 0.5 tablets (12.5 mg total) by mouth  every 12 (twelve) hours., Disp: 30 tablet, Rfl: 1 .  Multiple Vitamins-Minerals (BL CENTURY SENIOR PO), Take 1 tablet by mouth daily., Disp: , Rfl:  .  nitroGLYCERIN (NITROSTAT) 0.4 MG SL tablet, Place 0.4 mg under the tongue every 5 (five) minutes as needed for chest pain. Reported on 11/13/2015, Disp: , Rfl:  .  olmesartan (BENICAR) 20 MG tablet, Take by mouth., Disp: , Rfl:  .  Omega-3 Fatty Acids (FISH OIL) 1000 MG CAPS, Take 1,000 mg by mouth daily., Disp: , Rfl:  .  omeprazole (PRILOSEC) 40 MG capsule, Take 40 mg by mouth 2 (two) times daily., Disp: , Rfl:  .  sucralfate (CARAFATE) 1 g tablet, Take 1 g by mouth 4 (four) times daily., Disp: , Rfl:  .  tamsulosin (FLOMAX) 0.4 MG CAPS capsule, Take 1 capsule (0.4 mg total) by mouth daily., Disp: 30 capsule, Rfl: 0 .  ticagrelor (BRILINTA) 90 MG TABS tablet, Take by mouth., Disp: , Rfl:  .  traZODone (DESYREL) 100 MG tablet, Take 100 mg by mouth at bedtime., Disp: , Rfl:  .  venlafaxine XR (EFFEXOR-XR) 37.5 MG 24 hr capsule, Take 37.5 mg by mouth daily., Disp: , Rfl: 3  Past Medical History: Past Medical History:  Diagnosis Date  . Cancer ([place])    SKIN  . Coronary artery disease   .  Depression   . GERD (gastroesophageal reflux disease)   . History of kidney stones   . Hypertension   . Myocardial infarction ([place])    2012    Tobacco Use: Social History   Tobacco Use  Smoking Status Never Smoker  Smokeless Tobacco Current User  . Types: Chew  Tobacco Comment   May be ready to quit.    Labs: Recent Review Flowsheet Data    Labs for ITP Cardiac and Pulmonary Rehab Latest Ref Rng & Units 10/17/2018 10/18/2018   Cholestrol 0 - 200 mg/dL 138 -   LDLCALC 0 - 99 mg/dL 54 -   HDL >40 mg/dL 64 -   Trlycerides <150 mg/dL 101 -   Hemoglobin A1c 4.8 - 5.6 % - 5.5       Exercise Target Goals: Exercise Program Goal: Individual exercise prescription set using results from initial 6 min walk test and THRR while considering  patient's  activity barriers and safety.   Exercise Prescription Goal: Initial exercise prescription builds to 30-45 minutes a day of aerobic activity, 2-3 days per week.  Home exercise guidelines will be given to patient during program as part of exercise prescription that the participant will acknowledge.  Activity Barriers & Risk Stratification: Activity Barriers & Cardiac Risk Stratification - 04/01/19 1222      Activity Barriers & Cardiac Risk Stratification   Activity Barriers  None    Cardiac Risk Stratification  High       6 Minute Walk: 6 Minute Walk    Row Name 04/02/19 1458         6 Minute Walk   Phase  Initial     Distance  1600 feet     Walk Time  6 minutes     # of Rest Breaks  0     MPH  3.03     METS  2.98     RPE  12     Perceived Dyspnea   1     VO2 Peak  10.44     Symptoms  No     Resting HR  65 bpm     Resting BP  134/68     Resting Oxygen Saturation   97 %     Exercise Oxygen Saturation  during 6 min walk  95 %     Max Ex. HR  2.31 bpm     Max Ex. BP  148/64     2 Minute Post BP  138/74        Oxygen Initial Assessment:   Oxygen Re-Evaluation:   Oxygen Discharge (Final Oxygen Re-Evaluation):   Initial Exercise Prescription: Initial Exercise Prescription - 04/02/19 1500      Date of Initial Exercise RX and Referring Provider   Date  04/02/19    Referring Provider  [name]     Treadmill   MPH  2.7    Grade  0    Minutes  15    METs  3.07      Recumbant Bike   Level  3    RPM  60    Watts  25    Minutes  15    METs  3      Elliptical   Level  1    Speed  3    Minutes  15      REL-XR   Level  3    Speed  50    Minutes  15  METs  3      T5 Nustep   Level  2    SPM  80    Minutes  15    METs  3      Biostep-RELP   Level  3    SPM  50    Minutes  15    METs  3      Prescription Details   Frequency (times per week)  3    Duration  Progress to 30 minutes of continuous aerobic without signs/symptoms of  physical distress      Intensity   THRR 40-80% of Max Heartrate  94-122    Ratings of Perceived Exertion  11-15    Perceived Dyspnea  0-4      Resistance Training   Training Prescription  Yes    Weight  4 lb    Reps  10-15       Perform Capillary Blood Glucose checks as needed.  Exercise Prescription Changes: Exercise Prescription Changes    Row Name 04/02/19 1500 04/10/19 1100 04/23/19 1300 05/10/19 0900       Response to Exercise   Blood Pressure (Admit)  134/68  130/70  142/70  110/60    Blood Pressure (Exercise)  148/64  140/82  158/62  122/66    Blood Pressure (Exit)  138/74  140/80  100/60  108/48    Heart Rate (Admit)  65 bpm  66 bpm  63 bpm  64 bpm    Heart Rate (Exercise)  107 bpm  90 bpm  114 bpm  77 bpm    Heart Rate (Exit)  76 bpm  76 bpm  81 bpm  61 bpm    Oxygen Saturation (Admit)  95 %  --  --  --    Oxygen Saturation (Exercise)  97 %  --  --  --    Rating of Perceived Exertion (Exercise)  _0 Perceived Dyspnea (Exercise)  1  --  --  --    Symptoms  none  none  none  none    Comments  walk test  third full day of exercise  --  --    Duration  --  Continue with 30 min of aerobic exercise without signs/symptoms of physical distress.  Continue with 30 min of aerobic exercise without signs/symptoms of physical distress.  Continue with 30 min of aerobic exercise without signs/symptoms of physical distress.    Intensity  --  THRR unchanged  THRR unchanged  THRR unchanged      Progression   Progression  --  Continue to progress workloads to maintain intensity without signs/symptoms of physical distress.  Continue to progress workloads to maintain intensity without signs/symptoms of physical distress.  Continue to progress workloads to maintain intensity without signs/symptoms of physical distress.    Average METs  --  3.63  3.5  3.08      Resistance Training   Training Prescription  --  Yes  Yes  Yes    Weight  --  4 lb   5 lb   5 lb    Reps  --   10-15  10-15  10-15      Interval Training   Interval Training  --  No  No  No      Treadmill   MPH  --  2.3  2.7  2.7    Grade  --  0  0  0  Minutes  --  _0 METs  --  2.76  3.07  3.07      Recumbant Bike   Level  --  3  --  4    Watts  --  40  --  29    Minutes  --  15  --  15    METs  --  3.57  --  3.17      Elliptical   Level  --  --  1  1    Speed  --  --  3  3    Minutes  --  --  15 2-3 at a time the rest  15      T5 Nustep   Level  --  2  --  --    Minutes  --  15  --  --    METs  --  3.2  --  --      Biostep-RELP   Level  --  3  --  3    Minutes  --  15  --  15    METs  --  5  --  3      Home Exercise Plan   Plans to continue exercise at  --  --  --  Home (comment) walking, return to [facility]    Frequency  --  --  --  Add 3 additional days to program exercise sessions.    Initial Home Exercises Provided  --  --  --  04/15/19       Exercise Comments:   Exercise Goals and Review: Exercise Goals    Row Name 04/02/19 1510             Exercise Goals   Increase Physical Activity  Yes       Intervention  Provide advice, education, support and counseling about physical activity/exercise needs.;Develop an individualized exercise prescription for aerobic and resistive training based on initial evaluation findings, risk stratification, comorbidities and participant's personal goals.       Expected Outcomes  Short Term: Attend rehab on a regular basis to increase amount of physical activity.;Long Term: Add in home exercise to make exercise part of routine and to increase amount of physical activity.;Long Term: Exercising regularly at least 3-5 days a week.       Increase Strength and Stamina  Yes       Intervention  Provide advice, education, support and counseling about physical activity/exercise needs.;Develop an individualized exercise prescription for aerobic and resistive training based on initial evaluation findings, risk stratification,  comorbidities and participant's personal goals.       Expected Outcomes  Short Term: Increase workloads from initial exercise prescription for resistance, speed, and METs.;Short Term: Perform resistance training exercises routinely during rehab and add in resistance training at home;Long Term: Improve cardiorespiratory fitness, muscular endurance and strength as measured by increased METs and functional capacity (6MWT)       Able to understand and use rate of perceived exertion (RPE) scale  Yes       Intervention  Provide education and explanation on how to use RPE scale       Expected Outcomes  Short Term: Able to use RPE daily in rehab to express subjective intensity level;Long Term:  Able to use RPE to guide intensity level when exercising independently       Knowledge and understanding of Target Heart Rate Range (THRR)  Yes  Intervention  Provide education and explanation of THRR including how the numbers were predicted and where they are located for reference       Expected Outcomes  Short Term: Able to state/look up THRR;Short Term: Able to use daily as guideline for intensity in rehab;Long Term: Able to use THRR to govern intensity when exercising independently       Able to check pulse independently  Yes       Intervention  Provide education and demonstration on how to check pulse in carotid and radial arteries.;Review the importance of being able to check your own pulse for safety during independent exercise       Expected Outcomes  Short Term: Able to explain why pulse checking is important during independent exercise;Long Term: Able to check pulse independently and accurately       Understanding of Exercise Prescription  Yes       Intervention  Provide education, explanation, and written materials on patient's individual exercise prescription       Expected Outcomes  Short Term: Able to explain program exercise prescription;Long Term: Able to explain home exercise prescription to  exercise independently          Exercise Goals Re-Evaluation : Exercise Goals Re-Evaluation    Row Name 04/03/19 1424 04/10/19 1131 04/15/19 1456 04/23/19 1318 05/10/19 0936     Exercise Goal Re-Evaluation   Exercise Goals Review  Increase Physical Activity;Able to understand and use rate of perceived exertion (RPE) scale;Knowledge and understanding of Target Heart Rate Range (THRR);Understanding of Exercise Prescription;Increase Strength and Stamina;Able to check pulse independently  Increase Physical Activity;Increase Strength and Stamina;Understanding of Exercise Prescription  Increase Physical Activity;Increase Strength and Stamina;Understanding of Exercise Prescription;Able to understand and use rate of perceived exertion (RPE) scale;Able to understand and use Dyspnea scale;Knowledge and understanding of Target Heart Rate Range (THRR);Able to check pulse independently  Increase Physical Activity;Increase Strength and Stamina;Able to understand and use rate of perceived exertion (RPE) scale;Knowledge and understanding of Target Heart Rate Range (THRR);Able to check pulse independently;Understanding of Exercise Prescription  Increase Physical Activity;Increase Strength and Stamina;Understanding of Exercise Prescription   Comments  Reviewed RPE scale, THR and program prescription with pt today.  Pt voiced understanding and was given a copy of goals to take home.  Randie is off to a good start in rehab.  He is doing 2.3 mph on the treadmill as the 2.7 mph felt a little fast.  We will continue to montior his progress.  Mayford is doing well in rehab.  He has already started to do some stretching and walking at home. Reviewed home exercise with pt today.  Pt plans to walk and use staff videos for exercise.  Reviewed THR, pulse, RPE, sign and symptoms, and when to call 911 or MD.  Also discussed weather considerations and indoor options.  Pt voiced understanding.  Cort works in [employer] and RPE range.  He has  increased to 5 lb weights.  Staff will monitor progress.  Juwan has been doing well in rehab.  He tried the ellipitcal just before Christmas and found it challenging.  He is also up to level 4 on the bike.  We will continue to monitor his progress.   Expected Outcomes  Short: Use RPE daily to regulate intensity. Long: Follow program prescription in THR.  Short: Continue to attend regularly.  Long: Continue to follow program prescritpion  Short: Continue to walk at home on off days.  Long: Continue to improve stamina.  Short -  continue to exercise conistently Long improve overall stamina  Short: Continue to exercise on off days.  Long: Continue to improve stamina to return to [place].   [place] Name 05/20/19 1330             Exercise Goal Re-Evaluation   Exercise Goals Review  Increase Physical Activity;Increase Strength and Stamina;Understanding of Exercise Prescription       Comments  Graison reported that he is exercising about every day. He is walking, using a stepper, using weights, and stretching. He stated that he is very committed to his cardiac rehab program and is anxious to get back to coming to [facility] 3 days a week. He stated his commitment to continue with home exercise until the program re-opens fully.       Expected Outcomes  Short: continue to do home exercise until cardiac rehab fully re-opens, and then come back to exercise in [facility] 3 days per week. Long: continue to improve stamina.          Discharge Exercise Prescription (Final Exercise Prescription Changes): Exercise Prescription Changes - 05/10/19 0900      Response to Exercise   Blood Pressure (Admit)  110/60    Blood Pressure (Exercise)  122/66    Blood Pressure (Exit)  108/48    Heart Rate (Admit)  64 bpm    Heart Rate (Exercise)  77 bpm    Heart Rate (Exit)  61 bpm    Rating of Perceived Exertion (Exercise)  15    Symptoms  none    Duration  Continue with 30 min of aerobic exercise without signs/symptoms of physical distress.     Intensity  THRR unchanged      Progression   Progression  Continue to progress workloads to maintain intensity without signs/symptoms of physical distress.    Average METs  3.08      Resistance Training   Training Prescription  Yes    Weight   5 lb    Reps  10-15      Interval Training   Interval Training  No      Treadmill   MPH  2.7    Grade  0    Minutes  15    METs  3.07      Recumbant Bike   Level  4    Watts  29    Minutes  15    METs  3.17      Elliptical   Level  1    Speed  3    Minutes  15      Biostep-RELP   Level  3    Minutes  15    METs  3      Home Exercise Plan   Plans to continue exercise at  Home (comment)   walking, return to [place] 3 additional days to program exercise sessions.    Initial Home Exercises Provided  04/15/19       Nutrition:  Target Goals: Understanding of nutrition guidelines, daily intake of sodium <1533m, cholesterol <2059m calories 30% from fat and 7% or less from saturated fats, daily to have 5 or more servings of fruits and vegetables.  Biometrics: Pre Biometrics - 04/02/19 1510      Pre Biometrics   Height  _0  (1.727 m)    Weight  175 lb 3.2 oz (79.5 kg)    BMI (Calculated)  26.65    Single Leg Stand  10.31 seconds  Nutrition Therapy Plan and Nutrition Goals: Nutrition Therapy & Goals - 04/04/19 1003      Nutrition Therapy   Diet  Low Na, HH diet    Protein (specify units)  65g    Fiber  30 grams    Whole Grain Foods  3 servings    Saturated Fats  12 max. grams    Fruits and Vegetables  5 servings/day    Sodium  1.5 grams      Personal Nutrition Goals   Nutrition Goal  ST: reduce Na LT: UBW 175#, wants to get down to 165# - wants to gain strength    Comments  Pt reports not eating properly, but is trying. Pt wants to build skills. B: 2 cups coffee with danish or cereal like raisin bran with whole milk and banana. Drinks water during the day. L:meatloaf, stewed apples  with pintos or corn and okra etc. at [employer] (has this the 3 days coming to rehab) and frozen pizza (half) and boiled eggs w/ turnip greens and corned bread and meatloaf with baked beans/ peas and frozen vegetables with meat. Pt reports loving vegetables. S: small box of raisins most days, ritz crackers with jiff peanut butter with a glass of whole milk (1-2x/week). Pt reports loving his families homemade chocolate pie. Will sometimes have a can of tuna (bread and butter pickle juice) with some ritz crackers. Pt reports sodium is a problem for him. Pt also reports likes [place] stew. Discussed general [place] eating. Pt would like to also work on skill goals, pt reports mild ST memory issues, discussed giving him handouts to keep for review and providing him with youtube skills videos (reports having access to computer).      Intervention Plan   Intervention  Nutrition handout(s) given to patient.;Prescribe, educate and counsel regarding individualized specific dietary modifications aiming towards targeted core components such as weight, hypertension, lipid management, diabetes, heart failure and other comorbidities.    Expected Outcomes  Short Term Goal: Understand basic principles of dietary content, such as calories, fat, sodium, cholesterol and nutrients.;Short Term Goal: A plan has been developed with personal nutrition goals set during dietitian appointment.;Long Term Goal: Adherence to prescribed nutrition plan.       Nutrition Assessments:   Nutrition Goals Re-Evaluation:   Nutrition Goals Discharge (Final Nutrition Goals Re-Evaluation):   Psychosocial: Target Goals: Acknowledge presence or absence of significant depression and/or stress, maximize coping skills, provide positive support system. Participant is able to verbalize types and ability to use techniques and skills needed for reducing stress and depression.   Initial Review & Psychosocial Screening: Initial Psych Review &  Screening - 04/01/19 1223      Initial Review   Current issues with  None Identified    Comments  Wife passed away this 10-05-2018 She had been sick 5-6 years and [place] cared for her.      Family Dynamics   Good Support System?  Yes   Daughter lives [place], Son in [place], has niece lives up the street  He talks to [employer] all daily. Neighbors keep eye on [employer].     Barriers   Psychosocial barriers to participate in program  There are no identifiable barriers or psychosocial needs.;The patient should benefit from training in stress management and relaxation.      Screening Interventions   Interventions  Encouraged to exercise;Provide feedback about the scores to participant;To provide support and resources with identified psychosocial needs    Expected  Outcomes  Short Term goal: Utilizing psychosocial counselor, staff and physician to assist with identification of specific Stressors or current issues interfering with healing process. Setting desired goal for each stressor or current issue identified.;Long Term Goal: Stressors or current issues are controlled or eliminated.;Short Term goal: Identification and review with participant of any Quality of Life or Depression concerns found by scoring the questionnaire.;Long Term goal: The participant improves quality of Life and PHQ9 Scores as seen by post scores and/or verbalization of changes       Quality of Life Scores:  Quality of Life - 04/02/19 1515      Quality of Life   Select  Quality of Life      Quality of Life Scores   Health/Function Pre  27.43 %    Socioeconomic Pre  30 %    Psych/Spiritual Pre  30 %    Family Pre  30 %    GLOBAL Pre  28.84 %      Scores of 19 and below usually indicate a poorer quality of life in these areas.  A difference of  2-3 points is a clinically meaningful difference.  A difference of 2-3 points in the total score of the Quality of Life Index has been associated with significant improvement in  overall quality of life, self-image, physical symptoms, and general health in studies assessing change in quality of life.  PHQ-9: Recent Review Flowsheet Data    Depression screen [facility] 2/9 04/02/2019   Decreased Interest 0   Down, Depressed, Hopeless 0   PHQ - 2 Score 0   Altered sleeping 0   Tired, decreased energy 2   Trouble concentrating 0   Moving slowly or fidgety/restless 0   Suicidal thoughts 0   PHQ-9 Score 2   Difficult doing work/chores Not difficult at all     Interpretation of Total Score  Total Score Depression Severity:  1-4 = Minimal depression, 5-9 = Mild depression, 10-14 = Moderate depression, 15-19 = Moderately severe depression, 20-27 = Severe depression   Psychosocial Evaluation and Intervention: Psychosocial Evaluation - 04/01/19 1238      Psychosocial Evaluation & Interventions   Interventions  Encouraged to exercise with the program and follow exercise prescription    Comments  Kimmie has no barriers to attending the program. He is excited to get started and get out of the house.  His second wife passed away in 2022/09/18 this year. She had been sick and required his care for the past 5-6 years. He wants to lose some weight that he gained while he was not able to be active as he cared for his wife. He has a great support system, HIs children , a niece and his neighbors. He talks to them all daily. He gets around the neighborhood on his golfcart. He plans to attend the full program.    Expected Outcomes  STG: Mace attends sessions LTG: Yovanni is able to utilize his lifestyle modifications he ahs learned during the program.    Continue Psychosocial Services   Follow up required by staff       Psychosocial Re-Evaluation: Psychosocial Re-Evaluation    [place] Name 04/15/19 1451 05/20/19 1333           Psychosocial Re-Evaluation   Current issues with  Current Stress Concerns  Current Stress Concerns      Comments  [place] is doing well in rehab. He is working on quitting  chewing tobacco.  He really wants to build his energy back  up to be able to get out to hike again. His son is coming to visit for Christmas week!!  He is looking forward to having him come visit.  He is taking safety protocols for his son. Overall he is feeling good.  Since he has started he has really enjoyed getting to see people and getting the exercise in regularly. He is impressed with how much his energy levels are starting to recover.  Casy reports no new stress concerns and states that he has a strong support system.      Expected Outcomes  Short: Enjoy visit with son.  Long: Continue to attend exercise regularly.  Short: Continue to rely on support system for mental health needs. Long: continue to exercise to help manage stress.      Interventions  Encouraged to attend Cardiac Rehabilitation for the exercise  --      Continue Psychosocial Services   Follow up required by staff  --         Psychosocial Discharge (Final Psychosocial Re-Evaluation): Psychosocial Re-Evaluation - 05/20/19 1333      Psychosocial Re-Evaluation   Current issues with  Current Stress Concerns    Comments  Azahel reports no new stress concerns and states that he has a strong support system.    Expected Outcomes  Short: Continue to rely on support system for mental health needs. Long: continue to exercise to help manage stress.       Vocational Rehabilitation: Provide vocational rehab assistance to qualifying candidates.   Vocational Rehab Evaluation & Intervention: Vocational Rehab - 04/01/19 1226      Initial Vocational Rehab Evaluation & Intervention   Assessment shows need for Vocational Rehabilitation  No       Education: Education Goals: Education classes will be provided on a variety of topics geared toward better understanding of heart health and risk factor modification. Participant will state understanding/return demonstration of topics presented as noted by education test scores.  Learning  Barriers/Preferences: Learning Barriers/Preferences - 04/01/19 1226      Learning Barriers/Preferences   Learning Barriers  None    Learning Preferences  None       Education Topics:  AED/CPR: - Group verbal and written instruction with the use of models to demonstrate the basic use of the AED with the basic ABC's of resuscitation.   General Nutrition Guidelines/Fats and Fiber: -Group instruction provided by verbal, written material, models and posters to present the general guidelines for heart healthy nutrition. Gives an explanation and review of dietary fats and fiber.   Controlling Sodium/Reading Food Labels: -Group verbal and written material supporting the discussion of sodium use in heart healthy nutrition. Review and explanation with models, verbal and written materials for utilization of the food label.   Exercise Physiology & General Exercise Guidelines: - Group verbal and written instruction with models to review the exercise physiology of the cardiovascular system and associated critical values. Provides general exercise guidelines with specific guidelines to those with heart or lung disease.    Aerobic Exercise & Resistance Training: - Gives group verbal and written instruction on the various components of exercise. Focuses on aerobic and resistive training programs and the benefits of this training and how to safely progress through these programs..   Flexibility, Balance, Mind/Body Relaxation: Provides group verbal/written instruction on the benefits of flexibility and balance training, including mind/body exercise modes such as yoga, pilates and tai chi.  Demonstration and skill practice provided.   Cardiac Rehab from 04/04/2019 in [facility]  Cardiac and Pulmonary Rehab  Date  04/04/19  Educator  AS  Instruction Review Code  1- Verbalizes Understanding      Stress and Anxiety: - Provides group verbal and written instruction about the health risks of elevated stress  and causes of high stress.  Discuss the correlation between heart/lung disease and anxiety and treatment options. Review healthy ways to manage with stress and anxiety.   Depression: - Provides group verbal and written instruction on the correlation between heart/lung disease and depressed mood, treatment options, and the stigmas associated with seeking treatment.   Anatomy & Physiology of the Heart: - Group verbal and written instruction and models provide basic cardiac anatomy and physiology, with the coronary electrical and arterial systems. Review of Valvular disease and Heart Failure   Cardiac Procedures: - Group verbal and written instruction to review commonly prescribed medications for heart disease. Reviews the medication, class of the drug, and side effects. Includes the steps to properly store meds and maintain the prescription regimen. (beta blockers and nitrates)   Cardiac Medications I: - Group verbal and written instruction to review commonly prescribed medications for heart disease. Reviews the medication, class of the drug, and side effects. Includes the steps to properly store meds and maintain the prescription regimen.   Cardiac Medications II: -Group verbal and written instruction to review commonly prescribed medications for heart disease. Reviews the medication, class of the drug, and side effects. (all other drug classes)    Go Sex-Intimacy & Heart Disease, Get SMART - Goal Setting: - Group verbal and written instruction through game format to discuss heart disease and the return to sexual intimacy. Provides group verbal and written material to discuss and apply goal setting through the application of the S.M.A.R.T. Method.   Other Matters of the Heart: - Provides group verbal, written materials and models to describe Stable Angina and Peripheral Artery. Includes description of the disease process and treatment options available to the cardiac  patient.   Exercise & Equipment Safety: - Individual verbal instruction and demonstration of equipment use and safety with use of the equipment.   Cardiac Rehab from 04/04/2019 in [facility] Cardiac and Pulmonary Rehab  Date  04/02/19  Educator  AS  Instruction Review Code  1- Verbalizes Understanding      Infection Prevention: - Provides verbal and written material to individual with discussion of infection control including proper hand washing and proper equipment cleaning during exercise session.   Cardiac Rehab from 04/04/2019 in [facility] Cardiac and Pulmonary Rehab  Date  04/02/19  Educator  AS  Instruction Review Code  1- Verbalizes Understanding      Falls Prevention: - Provides verbal and written material to individual with discussion of falls prevention and safety.   Cardiac Rehab from 04/04/2019 in [facility] Cardiac and Pulmonary Rehab  Date  04/02/19  Educator  AS  Instruction Review Code  1- Verbalizes Understanding      Diabetes: - Individual verbal and written instruction to review signs/symptoms of diabetes, desired ranges of glucose level fasting, after meals and with exercise. Acknowledge that pre and post exercise glucose checks will be done for 3 sessions at entry of program.   Know Your Numbers and Risk Factors: -Group verbal and written instruction about important numbers in your health.  Discussion of what are risk factors and how they play a role in the disease process.  Review of Cholesterol, Blood Pressure, Diabetes, and BMI and the role they play in your overall health.   Sleep  Hygiene: -Provides group verbal and written instruction about how sleep can affect your health.  Define sleep hygiene, discuss sleep cycles and impact of sleep habits. Review good sleep hygiene tips.    Other: -Provides group and verbal instruction on various topics (see comments)   Knowledge Questionnaire Score: Knowledge Questionnaire Score - 04/02/19 1514      Knowledge Questionnaire  Score   Pre Score  19/26 nutrition       Core Components/Risk Factors/Patient Goals at Admission: Personal Goals and Risk Factors at Admission - 04/02/19 1511      Core Components/Risk Factors/Patient Goals on Admission    Weight Management  Weight Maintenance;Yes    Intervention  Weight Management: Develop a combined nutrition and exercise program designed to reach desired caloric intake, while maintaining appropriate intake of nutrient and fiber, sodium and fats, and appropriate energy expenditure required for the weight goal.;Weight Management: Provide education and appropriate resources to help participant work on and attain dietary goals.    Admit Weight  175 lb 3.2 oz (79.5 kg)    Expected Outcomes  Short Term: Continue to assess and modify interventions until short term weight is achieved;Long Term: Adherence to nutrition and physical activity/exercise program aimed toward attainment of established weight goal;Weight Maintenance: Understanding of the daily nutrition guidelines, which includes 25-35% calories from fat, 7% or less cal from saturated fats, less than '200mg'$  cholesterol, less than 1.5gm of sodium, & 5 or more servings of fruits and vegetables daily    Intervention  Assist the participant in steps to quit. Provide individualized education and counseling about committing to Tobacco Cessation, relapse prevention, and pharmacological support that can be provided by physician.;Advice worker, assist with locating and accessing local/national Quit Smoking programs, and support quit date choice.    Expected Outcomes  Short Term: Will demonstrate readiness to quit, by selecting a quit date.;Short Term: Will quit all tobacco product use, adhering to prevention of relapse plan.;Long Term: Complete abstinence from all tobacco products for at least 12 months from quit date.       Core Components/Risk Factors/Patient Goals Review:  Goals and Risk Factor Review    Row Name  04/15/19 1447 05/20/19 1332           Core Components/Risk Factors/Patient Goals Review   Personal Goals Review  Weight Management/Obesity;Tobacco Cessation;Hypertension;Lipids  Weight Management/Obesity;Tobacco Cessation;Hypertension;Lipids      Review  Juaquin is doing well in rehab.  He is already down to 165 lb at home!  He is pleased with his progress and wants to continue to build muscle and energy levels.  Blood pressures have been good and he does check them at home.  He notices when it runs high/low based on how he feels.  He is doing well on his medications.  He has cut back on chewing tobacco and is making strides towards quitting altogether.  He wants to go slowly but is mentally ready to quit.  Pier reports that he is taking his meds a prescribed by his doctor. He is maintaining a steady weight. Does not smoke, but still uses chewing tobacco.      Expected Outcomes  Short: Continue to work toward tobacco cessation.  Long: Continue to build stamina.  Short: Continue to work toward tobacco cessation. Long: Continue to build stamina.         Core Components/Risk Factors/Patient Goals at Discharge (Final Review):  Goals and Risk Factor Review - 05/20/19 1332      Core Components/Risk Factors/Patient Goals  Review   Personal Goals Review  Weight Management/Obesity;Tobacco Cessation;Hypertension;Lipids    Review  Haskel reports that he is taking his meds a prescribed by his doctor. He is maintaining a steady weight. Does not smoke, but still uses chewing tobacco.    Expected Outcomes  Short: Continue to work toward tobacco cessation. Long: Continue to build stamina.       ITP Comments: ITP Comments    Row Name 04/01/19 1256 04/02/19 1521 04/03/19 1423 04/04/19 1033 05/01/19 0844   ITP Comments  Virtual Orientation call completed. Has appointment tomorrow with Ep for Eval and Gym Orientation. Diagnosis documentation can be found in [facility] 10/17/2018.  6MWT completed and initial ITP created  and sent to Dr Sabra Heck.  First full day of exercise!  Patient was oriented to gym and equipment including functions, settings, policies, and procedures.  Patient's individual exercise prescription and treatment plan were reviewed.  All starting workloads were established based on the results of the 6 minute walk test done at initial orientation visit.  The plan for exercise progression was also introduced and progression will be customized based on patient's performance and goals  Completed Initial RD eval  30 day review competed . ITP sent to Dr Emily Filbert for review, changes as needed and ITP approval signature   [place] Name 05/06/19 1348 05/20/19 1334 05/22/19 1422 05/27/19 1424 05/29/19 1212   ITP Comments  Checked in with patient since he is not in person this week. Stated he was doing really well. He is walking and doing exercises with weights. He reports that he is very thankful for all we have done and wants to keep working hard to feel better.  Virtual F/U call today:Yafet reported that he is exercising about every day. He is walking, using a stepper, using weights, and stretching. He stated that he is very committed to his cardiac rehab program and is anxious to get back to coming to [facility] 3 days a week. He stated his commitment to continue with home exercise until the program re-opens fully. Waylen reports that he is taking his meds a prescribed by his doctor. He is maintaining a steady weight. Does not smoke, but still uses chewing tobacco. Raven reports no new stress concerns and states that he has a strong support system.  Smith has not attended in person since last review.  Cardiac Rehab is operating at minimal capacity, RD unable to get nutrition re-eval this 30 day cycle.  30 day review completed. ITP sent to Dr. Emily Filbert, Medical Director of Cardiac and Pulmonary Rehab. Continue with ITP unless changes are made by physician.  Department operating under reduced schedule until further notice by request  from hospital leadership.      Comments: 30 day review

## 2019-06-03 ENCOUNTER — Encounter: Payer: Medicare HMO | Attending: Internal Medicine | Admitting: *Deleted

## 2019-06-03 ENCOUNTER — Other Ambulatory Visit: Payer: Self-pay

## 2019-06-03 DIAGNOSIS — Z79899 Other long term (current) drug therapy: Secondary | ICD-10-CM | POA: Insufficient documentation

## 2019-06-03 DIAGNOSIS — I213 ST elevation (STEMI) myocardial infarction of unspecified site: Secondary | ICD-10-CM | POA: Insufficient documentation

## 2019-06-03 DIAGNOSIS — K219 Gastro-esophageal reflux disease without esophagitis: Secondary | ICD-10-CM | POA: Insufficient documentation

## 2019-06-03 DIAGNOSIS — I252 Old myocardial infarction: Secondary | ICD-10-CM | POA: Insufficient documentation

## 2019-06-03 DIAGNOSIS — I2510 Atherosclerotic heart disease of native coronary artery without angina pectoris: Secondary | ICD-10-CM | POA: Insufficient documentation

## 2019-06-03 DIAGNOSIS — F329 Major depressive disorder, single episode, unspecified: Secondary | ICD-10-CM | POA: Insufficient documentation

## 2019-06-03 DIAGNOSIS — I10 Essential (primary) hypertension: Secondary | ICD-10-CM | POA: Insufficient documentation

## 2019-06-03 DIAGNOSIS — Z7982 Long term (current) use of aspirin: Secondary | ICD-10-CM | POA: Insufficient documentation

## 2019-06-03 DIAGNOSIS — Z791 Long term (current) use of non-steroidal anti-inflammatories (NSAID): Secondary | ICD-10-CM | POA: Insufficient documentation

## 2019-06-03 DIAGNOSIS — Z85828 Personal history of other malignant neoplasm of skin: Secondary | ICD-10-CM | POA: Insufficient documentation

## 2019-06-03 NOTE — Progress Notes (Signed)
Virtual call completed today. Calls are done every week that patient is not attending an onsite exercise session during the COVID 19 PHE Crisis. Today's call included review of : their home exercise, their personal goals, a psychosocial review.  Baer reports exercising very day using a stepper and walking. He reports using 5 lb weights for resistive work and has no symptoms or concerns.

## 2019-06-10 ENCOUNTER — Ambulatory Visit: Payer: Medicare HMO

## 2019-06-13 ENCOUNTER — Other Ambulatory Visit: Payer: Self-pay

## 2019-06-13 ENCOUNTER — Encounter: Payer: Medicare HMO | Admitting: *Deleted

## 2019-06-13 DIAGNOSIS — Z85828 Personal history of other malignant neoplasm of skin: Secondary | ICD-10-CM | POA: Diagnosis not present

## 2019-06-13 DIAGNOSIS — I213 ST elevation (STEMI) myocardial infarction of unspecified site: Secondary | ICD-10-CM | POA: Diagnosis not present

## 2019-06-13 DIAGNOSIS — I252 Old myocardial infarction: Secondary | ICD-10-CM | POA: Diagnosis not present

## 2019-06-13 DIAGNOSIS — F329 Major depressive disorder, single episode, unspecified: Secondary | ICD-10-CM | POA: Diagnosis not present

## 2019-06-13 DIAGNOSIS — Z79899 Other long term (current) drug therapy: Secondary | ICD-10-CM | POA: Diagnosis not present

## 2019-06-13 DIAGNOSIS — K219 Gastro-esophageal reflux disease without esophagitis: Secondary | ICD-10-CM | POA: Diagnosis not present

## 2019-06-13 DIAGNOSIS — I10 Essential (primary) hypertension: Secondary | ICD-10-CM | POA: Diagnosis not present

## 2019-06-13 DIAGNOSIS — I2510 Atherosclerotic heart disease of native coronary artery without angina pectoris: Secondary | ICD-10-CM | POA: Diagnosis not present

## 2019-06-13 DIAGNOSIS — Z791 Long term (current) use of non-steroidal anti-inflammatories (NSAID): Secondary | ICD-10-CM | POA: Diagnosis not present

## 2019-06-13 DIAGNOSIS — Z7982 Long term (current) use of aspirin: Secondary | ICD-10-CM | POA: Diagnosis not present

## 2019-06-13 NOTE — Progress Notes (Signed)
Daily Session Note  Patient Details  Name: GORAN OLDEN MRN: [id number] Date of Birth: November 19, 1933 Referring Provider:     Cardiac Rehab from 04/02/2019 in [facility] Cardiac and Pulmonary Rehab  Referring Provider  [name]      Encounter Date: 06/13/2019  Check In: Session Check In - 06/13/19 1320      Check-In   Supervising physician immediately available to respond to emergencies  See telemetry face sheet for immediately available ER MD    Location  [facility]-Cardiac & Pulmonary Rehab    Staff Present  Heath Lark, RN, BSN, CCRP;Meredith Sherryll Burger, RN BSN;Joseph Sayre [employer]    Virtual Visit  No    Medication changes reported      No    Fall or balance concerns reported     No    Warm-up and Cool-down  Performed on first and last piece of equipment    Resistance Training Performed  Yes    VAD Patient?  No    PAD/SET Patient?  No      Pain Assessment   Currently in Pain?  No/denies          Social History   Tobacco Use  Smoking Status Never Smoker  Smokeless Tobacco Current User  . Types: Chew  Tobacco Comment   May be ready to quit.    Goals Met:  Independence with exercise equipment Exercise tolerated well No report of cardiac concerns or symptoms  Goals Unmet:  Not Applicable  Comments: Pt able to follow exercise prescription today without complaint.  Will continue to monitor for progression.    Dr. Emily Filbert is Medical Director for [facility].

## 2019-06-17 ENCOUNTER — Encounter: Payer: Medicare HMO | Admitting: *Deleted

## 2019-06-17 ENCOUNTER — Other Ambulatory Visit: Payer: Self-pay

## 2019-06-17 DIAGNOSIS — I2510 Atherosclerotic heart disease of native coronary artery without angina pectoris: Secondary | ICD-10-CM | POA: Diagnosis not present

## 2019-06-17 DIAGNOSIS — I213 ST elevation (STEMI) myocardial infarction of unspecified site: Secondary | ICD-10-CM

## 2019-06-17 DIAGNOSIS — I252 Old myocardial infarction: Secondary | ICD-10-CM | POA: Diagnosis not present

## 2019-06-17 DIAGNOSIS — Z79899 Other long term (current) drug therapy: Secondary | ICD-10-CM | POA: Diagnosis not present

## 2019-06-17 DIAGNOSIS — K219 Gastro-esophageal reflux disease without esophagitis: Secondary | ICD-10-CM | POA: Diagnosis not present

## 2019-06-17 DIAGNOSIS — F329 Major depressive disorder, single episode, unspecified: Secondary | ICD-10-CM | POA: Diagnosis not present

## 2019-06-17 DIAGNOSIS — I10 Essential (primary) hypertension: Secondary | ICD-10-CM | POA: Diagnosis not present

## 2019-06-17 DIAGNOSIS — Z7982 Long term (current) use of aspirin: Secondary | ICD-10-CM | POA: Diagnosis not present

## 2019-06-17 DIAGNOSIS — Z85828 Personal history of other malignant neoplasm of skin: Secondary | ICD-10-CM | POA: Diagnosis not present

## 2019-06-17 NOTE — Progress Notes (Signed)
Daily Session Note  Patient Details  Name: LANIER MILLON MRN: [id number] Date of Birth: 28-Mar-1934 Referring Provider:     Cardiac Rehab from 04/02/2019 in [facility] Cardiac and Pulmonary Rehab  Referring Provider  [name]      Encounter Date: 06/17/2019  Check In: Session Check In - 06/17/19 1315      Check-In   Supervising physician immediately available to respond to emergencies  See telemetry face sheet for immediately available ER MD    Location  [facility]-Cardiac & Pulmonary Rehab    Staff Present  Hope Budds RDN, Rowe Pavy, BA, ACSM CEP, Exercise Physiologist;Kelly Amedeo Plenty, BS, ACSM CEP, Exercise Physiologist;Malayia Spizzirri Sherryll Burger, RN BSN    Virtual Visit  No    Medication changes reported      No    Fall or balance concerns reported     No    Warm-up and Cool-down  Performed on first and last piece of equipment    Resistance Training Performed  Yes    VAD Patient?  No    PAD/SET Patient?  No      Pain Assessment   Currently in Pain?  No/denies          Social History   Tobacco Use  Smoking Status Never Smoker  Smokeless Tobacco Current User  . Types: Chew  Tobacco Comment   May be ready to quit.    Goals Met:  Independence with exercise equipment Exercise tolerated well No report of cardiac concerns or symptoms Strength training completed today  Goals Unmet:  Not Applicable  Comments: Pt able to follow exercise prescription today without complaint.  Will continue to monitor for progression.    Dr. Emily Filbert is Medical Director for [facility].

## 2019-06-21 ENCOUNTER — Other Ambulatory Visit: Payer: Self-pay

## 2019-06-21 ENCOUNTER — Encounter: Payer: Medicare HMO | Admitting: *Deleted

## 2019-06-21 DIAGNOSIS — I213 ST elevation (STEMI) myocardial infarction of unspecified site: Secondary | ICD-10-CM

## 2019-06-21 NOTE — Progress Notes (Signed)
Virtual follow up appt completed today for goal review.  Pt also noted that he will be out on Monday as the cable company is coming to fix his phone line that afternoon.   Hicks has been doing well with his exercise at home.  He is using his stepper and weights daily. He missed coming to class yesterday as it is also his social outlet.   He is feeling quite frustrated right now.  The cable/phone company has been giving him the run around.  He has been trying to get his TV and phone line working for almost three weeks.  They are now supposed to be coming on Monday afternoon.   He said he has spent almost 3 hours on the phone with them!  Exercise class is his social outlet and he misses it when he can't come.   His pressures have been doing good and his weight has been fairly steady. Surprisingly he is not gaining weight since he makes an afternoon out of his exercise session by going to [employer] after class for late lunch/early dinner.

## 2019-06-24 ENCOUNTER — Ambulatory Visit: Payer: Medicare HMO

## 2019-06-24 DIAGNOSIS — F320 Major depressive disorder, single episode, mild: Secondary | ICD-10-CM | POA: Diagnosis not present

## 2019-06-24 DIAGNOSIS — Z125 Encounter for screening for malignant neoplasm of prostate: Secondary | ICD-10-CM | POA: Diagnosis not present

## 2019-06-24 DIAGNOSIS — I2119 ST elevation (STEMI) myocardial infarction involving other coronary artery of inferior wall: Secondary | ICD-10-CM | POA: Diagnosis not present

## 2019-06-26 ENCOUNTER — Encounter: Payer: Self-pay | Admitting: *Deleted

## 2019-06-26 DIAGNOSIS — I213 ST elevation (STEMI) myocardial infarction of unspecified site: Secondary | ICD-10-CM

## 2019-06-26 NOTE — Progress Notes (Signed)
Cardiac Individual Treatment Plan  Patient Details  Name: Patrick Wilson MRN: [id number] Date of Birth: [birth year] Referring Provider:     Cardiac Rehab from 04/02/2019 in [facility] Cardiac and Pulmonary Rehab  Referring Provider  Patrick Wilson [Paras[place]]      Initial Encounter Date:    Cardiac Rehab from 04/02/2019 in [facility] Cardiac and Pulmonary Rehab  Date  04/02/19      Visit Diagnosis: ST elevation myocardial infarction (STEMI), unspecified artery ([place])  Patient's Home Medications on Admission:  Current Outpatient Medications:  .  ALPRAZolam (XANAX) 0.25 MG tablet, Take 0.25 mg by mouth 2 (two) times a day., Disp: , Rfl:  .  aspirin EC 81 MG tablet, Take 81 mg by mouth daily., Disp: , Rfl:  .  atorvastatin (LIPITOR) 80 MG tablet, Take 1 tablet (80 mg total) by mouth at bedtime., Disp: 30 tablet, Rfl: 1 .  atorvastatin (LIPITOR) 80 MG tablet, Take by mouth., Disp: , Rfl:  .  B Complex Vitamins (VITAMIN B COMPLEX PO), Take 1 tablet by mouth daily. , Disp: , Rfl:  .  Denture Care Products (DENTURE ADHESIVE) CREA, Apply 1 application topically daily as needed. , Disp: , Rfl:  .  diphenhydrAMINE (EQ ALLERGY) 25 mg capsule, Take 25 mg by mouth every 6 (six) hours as needed for allergies., Disp: , Rfl:  .  docusate sodium (COLACE) 100 MG capsule, Take 1 capsule (100 mg total) by mouth 2 (two) times daily. (Patient not taking: Reported on 04/01/2019), Disp: 60 capsule, Rfl: 0 .  donepezil (ARICEPT) 10 MG tablet, Take by mouth., Disp: , Rfl:  .  lisinopril (ZESTRIL) 2.5 MG tablet, Take 1 tablet (2.5 mg total) by mouth daily., Disp: 30 tablet, Rfl: 1 .  magnesium gluconate (MAGONATE) 500 MG tablet, Take 500 mg by mouth daily., Disp: , Rfl:  .  meloxicam (MOBIC) 15 MG tablet, Take 15 mg by mouth at bedtime., Disp: , Rfl:  .  metoCLOPramide (REGLAN) 5 MG tablet, Take 5 mg by mouth 2 (two) times daily., Disp: , Rfl:  .  metoprolol tartrate (LOPRESSOR) 25 MG tablet, Take 0.5 tablets (12.5 mg total) by mouth  every 12 (twelve) hours., Disp: 30 tablet, Rfl: 1 .  Multiple Vitamins-Minerals (BL CENTURY SENIOR PO), Take 1 tablet by mouth daily., Disp: , Rfl:  .  nitroGLYCERIN (NITROSTAT) 0.4 MG SL tablet, Place 0.4 mg under the tongue every 5 (five) minutes as needed for chest pain. Reported on 11/13/2015, Disp: , Rfl:  .  olmesartan (BENICAR) 20 MG tablet, Take by mouth., Disp: , Rfl:  .  Omega-3 Fatty Acids (FISH OIL) 1000 MG CAPS, Take 1,000 mg by mouth daily., Disp: , Rfl:  .  omeprazole (PRILOSEC) 40 MG capsule, Take 40 mg by mouth 2 (two) times daily., Disp: , Rfl:  .  sucralfate (CARAFATE) 1 g tablet, Take 1 g by mouth 4 (four) times daily., Disp: , Rfl:  .  tamsulosin (FLOMAX) 0.4 MG CAPS capsule, Take 1 capsule (0.4 mg total) by mouth daily., Disp: 30 capsule, Rfl: 0 .  ticagrelor (BRILINTA) 90 MG TABS tablet, Take by mouth., Disp: , Rfl:  .  traZODone (DESYREL) 100 MG tablet, Take 100 mg by mouth at bedtime., Disp: , Rfl:  .  venlafaxine XR (EFFEXOR-XR) 37.5 MG 24 hr capsule, Take 37.5 mg by mouth daily., Disp: , Rfl: 3  Past Medical History: Past Medical History:  Diagnosis Date  . Cancer ([place])    SKIN  . Coronary artery disease   .  Depression   . GERD (gastroesophageal reflux disease)   . History of kidney stones   . Hypertension   . Myocardial infarction ([place])    2012    Tobacco Use: Social History   Tobacco Use  Smoking Status Never Smoker  Smokeless Tobacco Current User  . Types: Chew  Tobacco Comment   May be ready to quit.    Labs: Recent Review Flowsheet Data    Labs for ITP Cardiac and Pulmonary Rehab Latest Ref Rng & Units 10/17/2018 10/18/2018   Cholestrol 0 - 200 mg/dL 138 -   LDLCALC 0 - 99 mg/dL 54 -   HDL >40 mg/dL 64 -   Trlycerides <150 mg/dL 101 -   Hemoglobin A1c 4.8 - 5.6 % - 5.5       Exercise Target Goals: Exercise Program Goal: Individual exercise prescription set using results from initial 6 min walk test and THRR while considering  patient's  activity barriers and safety.   Exercise Prescription Goal: Initial exercise prescription builds to 30-45 minutes a day of aerobic activity, 2-3 days per week.  Home exercise guidelines will be given to patient during program as part of exercise prescription that the participant will acknowledge.  Activity Barriers & Risk Stratification: Activity Barriers & Cardiac Risk Stratification - 04/01/19 1222      Activity Barriers & Cardiac Risk Stratification   Activity Barriers  None    Cardiac Risk Stratification  High       6 Minute Walk: 6 Minute Walk    Row Name 04/02/19 1458         6 Minute Walk   Phase  Initial     Distance  1600 feet     Walk Time  6 minutes     # of Rest Breaks  0     MPH  3.03     METS  2.98     RPE  12     Perceived Dyspnea   1     VO2 Peak  10.44     Symptoms  No     Resting HR  65 bpm     Resting BP  134/68     Resting Oxygen Saturation   97 %     Exercise Oxygen Saturation  during 6 min walk  95 %     Max Ex. HR  2.31 bpm     Max Ex. BP  148/64     2 Minute Post BP  138/74        Oxygen Initial Assessment:   Oxygen Re-Evaluation:   Oxygen Discharge (Final Oxygen Re-Evaluation):   Initial Exercise Prescription: Initial Exercise Prescription - 04/02/19 1500      Date of Initial Exercise RX and Referring Provider   Date  04/02/19    Referring Provider  [name]     Treadmill   MPH  2.7    Grade  0    Minutes  15    METs  3.07      Recumbant Bike   Level  3    RPM  60    Watts  25    Minutes  15    METs  3      Elliptical   Level  1    Speed  3    Minutes  15      REL-XR   Level  3    Speed  50    Minutes  15  METs  3      T5 Nustep   Level  2    SPM  80    Minutes  15    METs  3      Biostep-RELP   Level  3    SPM  50    Minutes  15    METs  3      Prescription Details   Frequency (times per week)  3    Duration  Progress to 30 minutes of continuous aerobic without signs/symptoms of  physical distress      Intensity   THRR 40-80% of Max Heartrate  94-122    Ratings of Perceived Exertion  11-15    Perceived Dyspnea  0-4      Resistance Training   Training Prescription  Yes    Weight  4 lb    Reps  10-15       Perform Capillary Blood Glucose checks as needed.  Exercise Prescription Changes: Exercise Prescription Changes    Row Name 04/02/19 1500 04/10/19 1100 04/23/19 1300 05/10/19 0900 06/18/19 1200     Response to Exercise   Blood Pressure (Admit)  134/68  130/70  142/70  110/60  126/70   Blood Pressure (Exercise)  148/64  140/82  158/62  122/66  158/82   Blood Pressure (Exit)  138/74  140/80  100/60  108/48  102/60   Heart Rate (Admit)  65 bpm  66 bpm  63 bpm  64 bpm  55 bpm   Heart Rate (Exercise)  107 bpm  90 bpm  114 bpm  77 bpm  103 bpm   Heart Rate (Exit)  76 bpm  76 bpm  81 bpm  61 bpm  79 bpm   Oxygen Saturation (Admit)  95 %  --  --  --  --   Oxygen Saturation (Exercise)  97 %  --  --  --  --   Rating of Perceived Exertion (Exercise)  '12  15  15  15  17   '$ Perceived Dyspnea (Exercise)  1  --  --  --  --   Symptoms  none  none  none  none  noone   Comments  walk test  third full day of exercise  --  --  --   Duration  --  Continue with 30 min of aerobic exercise without signs/symptoms of physical distress.  Continue with 30 min of aerobic exercise without signs/symptoms of physical distress.  Continue with 30 min of aerobic exercise without signs/symptoms of physical distress.  Continue with 30 min of aerobic exercise without signs/symptoms of physical distress.   Intensity  --  THRR unchanged  THRR unchanged  THRR unchanged  THRR unchanged     Progression   Progression  --  Continue to progress workloads to maintain intensity without signs/symptoms of physical distress.  Continue to progress workloads to maintain intensity without signs/symptoms of physical distress.  Continue to progress workloads to maintain intensity without signs/symptoms of  physical distress.  Continue to progress workloads to maintain intensity without signs/symptoms of physical distress.   Average METs  --  3.63  3.5  3.08  3.07     Resistance Training   Training Prescription  --  Yes  Yes  Yes  Yes   Weight  --  4 lb   5 lb   5 lb   5lb   Reps  --  10-15  10-15  10-15  10-15  Interval Training   Interval Training  --  No  No  No  No     Treadmill   MPH  --  2.3  2.7  2.7  2.7   Grade  --  0  0  0  0   Minutes  --  '15  15  15  15   '$ METs  --  2.76  3.07  3.07  3.07     Recumbant Bike   Level  --  3  --  4  --   Watts  --  40  --  29  --   Minutes  --  15  --  15  --   METs  --  3.57  --  3.17  --     Elliptical   Level  --  --  '1  1  1   '$ Speed  --  --  '3  3  3   '$ Minutes  --  --  15 2-3 at a time the rest  15  15     T5 Nustep   Level  --  2  --  --  --   Minutes  --  15  --  --  --   METs  --  3.2  --  --  --     Biostep-RELP   Level  --  3  --  3  --   Minutes  --  15  --  15  --   METs  --  5  --  3  --     Home Exercise Plan   Plans to continue exercise at  --  --  --  Home (comment) walking, return to [facility] (comment) walking, return to [facility]   Frequency  --  --  --  Add 3 additional days to program exercise sessions.  Add 3 additional days to program exercise sessions.   Initial Home Exercises Provided  --  --  --  04/15/19  04/15/19      Exercise Comments:   Exercise Goals and Review: Exercise Goals    Row Name 04/02/19 1510             Exercise Goals   Increase Physical Activity  Yes       Intervention  Provide advice, education, support and counseling about physical activity/exercise needs.;Develop an individualized exercise prescription for aerobic and resistive training based on initial evaluation findings, risk stratification, comorbidities and participant's personal goals.       Expected Outcomes  Short Term: Attend rehab on a regular basis to increase amount of physical activity.;Long Term: Add in  home exercise to make exercise part of routine and to increase amount of physical activity.;Long Term: Exercising regularly at least 3-5 days a week.       Increase Strength and Stamina  Yes       Intervention  Provide advice, education, support and counseling about physical activity/exercise needs.;Develop an individualized exercise prescription for aerobic and resistive training based on initial evaluation findings, risk stratification, comorbidities and participant's personal goals.       Expected Outcomes  Short Term: Increase workloads from initial exercise prescription for resistance, speed, and METs.;Short Term: Perform resistance training exercises routinely during rehab and add in resistance training at home;Long Term: Improve cardiorespiratory fitness, muscular endurance and strength as measured by increased METs and functional capacity (6MWT)       Able to understand and use rate of perceived exertion (  RPE) scale  Yes       Intervention  Provide education and explanation on how to use RPE scale       Expected Outcomes  Short Term: Able to use RPE daily in rehab to express subjective intensity level;Long Term:  Able to use RPE to guide intensity level when exercising independently       Knowledge and understanding of Target Heart Rate Range (THRR)  Yes       Intervention  Provide education and explanation of THRR including how the numbers were predicted and where they are located for reference       Expected Outcomes  Short Term: Able to state/look up THRR;Short Term: Able to use daily as guideline for intensity in rehab;Long Term: Able to use THRR to govern intensity when exercising independently       Able to check pulse independently  Yes       Intervention  Provide education and demonstration on how to check pulse in carotid and radial arteries.;Review the importance of being able to check your own pulse for safety during independent exercise       Expected Outcomes  Short Term: Able to  explain why pulse checking is important during independent exercise;Long Term: Able to check pulse independently and accurately       Understanding of Exercise Prescription  Yes       Intervention  Provide education, explanation, and written materials on patient's individual exercise prescription       Expected Outcomes  Short Term: Able to explain program exercise prescription;Long Term: Able to explain home exercise prescription to exercise independently          Exercise Goals Re-Evaluation : Exercise Goals Re-Evaluation    Row Name 04/03/19 1424 04/10/19 1131 04/15/19 1456 04/23/19 1318 05/10/19 0936     Exercise Goal Re-Evaluation   Exercise Goals Review  Increase Physical Activity;Able to understand and use rate of perceived exertion (RPE) scale;Knowledge and understanding of Target Heart Rate Range (THRR);Understanding of Exercise Prescription;Increase Strength and Stamina;Able to check pulse independently  Increase Physical Activity;Increase Strength and Stamina;Understanding of Exercise Prescription  Increase Physical Activity;Increase Strength and Stamina;Understanding of Exercise Prescription;Able to understand and use rate of perceived exertion (RPE) scale;Able to understand and use Dyspnea scale;Knowledge and understanding of Target Heart Rate Range (THRR);Able to check pulse independently  Increase Physical Activity;Increase Strength and Stamina;Able to understand and use rate of perceived exertion (RPE) scale;Knowledge and understanding of Target Heart Rate Range (THRR);Able to check pulse independently;Understanding of Exercise Prescription  Increase Physical Activity;Increase Strength and Stamina;Understanding of Exercise Prescription   Comments  Reviewed RPE scale, THR and program prescription with pt today.  Pt voiced understanding and was given a copy of goals to take home.  Patrick Wilson is off to a good start in rehab.  He is doing 2.3 mph on the treadmill as the 2.7 mph felt a little  fast.  We will continue to montior his progress.  Patrick Wilson is doing well in rehab.  He has already started to do some stretching and walking at home. Reviewed home exercise with pt today.  Pt plans to walk and use staff videos for exercise.  Reviewed THR, pulse, RPE, sign and symptoms, and when to call 911 or MD.  Also discussed weather considerations and indoor options.  Pt voiced understanding.  Patrick Wilson works in [employer] and RPE range.  He has increased to 5 lb weights.  Staff will monitor progress.  Patrick Wilson has been doing well in  rehab.  He tried the ellipitcal just before Christmas and found it challenging.  He is also up to level 4 on the bike.  We will continue to monitor his progress.   Expected Outcomes  Short: Use RPE daily to regulate intensity. Long: Follow program prescription in THR.  Short: Continue to attend regularly.  Long: Continue to follow program prescritpion  Short: Continue to walk at home on off days.  Long: Continue to improve stamina.  Short - continue to exercise conistently Long improve overall stamina  Short: Continue to exercise on off days.  Long: Continue to improve stamina to return to [place].   [place] Name 05/20/19 1330 06/18/19 1228 06/21/19 1158         Exercise Goal Re-Evaluation   Exercise Goals Review  Increase Physical Activity;Increase Strength and Stamina;Understanding of Exercise Prescription  Increase Physical Activity;Increase Strength and Stamina;Able to understand and use rate of perceived exertion (RPE) scale;Knowledge and understanding of Target Heart Rate Range (THRR);Able to check pulse independently;Understanding of Exercise Prescription;Able to understand and use Dyspnea scale  Increase Physical Activity;Increase Strength and Stamina;Understanding of Exercise Prescription     Comments  Patrick Wilson reported that he is exercising about every day. He is walking, using a stepper, using weights, and stretching. He stated that he is very committed to his cardiac rehab program and  is anxious to get back to coming to [facility] 3 days a week. He stated his commitment to continue with home exercise until the program re-opens fully.  Patrick Wilson has been trying the elliptical during classes.  It is challenging for him but he is willing to work on increasing time.  Patrick Wilson has been doing well with his exercise at home.  He is using his stepper and weights daily. He missed coming to class yesterday as it is also his social outlet.     Expected Outcomes  Short: continue to do home exercise until cardiac rehab fully re-opens, and then come back to exercise in [place] 3 days per week. Long: continue to improve stamina.  Short : work up to 15 min on elliptical without stopping Long :  build stamina  Short: Continue to exercise on off days.  Long: Continue to build stamina.        Discharge Exercise Prescription (Final Exercise Prescription Changes): Exercise Prescription Changes - 06/18/19 1200      Response to Exercise   Blood Pressure (Admit)  126/70    Blood Pressure (Exercise)  158/82    Blood Pressure (Exit)  102/60    Heart Rate (Admit)  55 bpm    Heart Rate (Exercise)  103 bpm    Heart Rate (Exit)  79 bpm    Rating of Perceived Exertion (Exercise)  17    Symptoms  noone    Duration  Continue with 30 min of aerobic exercise without signs/symptoms of physical distress.    Intensity  THRR unchanged      Progression   Progression  Continue to progress workloads to maintain intensity without signs/symptoms of physical distress.    Average METs  3.07      Resistance Training   Training Prescription  Yes    Weight   5lb    Reps  10-15      Interval Training   Interval Training  No      Treadmill   MPH  2.7    Grade  0    Minutes  15    METs  3.07  Elliptical   Level  1    Speed  3    Minutes  15      Home Exercise Plan   Plans to continue exercise at  Home (comment)   walking, return to [place] 3 additional days to program exercise sessions.    Initial  Home Exercises Provided  04/15/19       Nutrition:  Target Goals: Understanding of nutrition guidelines, daily intake of sodium '1500mg'$ , cholesterol '200mg'$ , calories 30% from fat and 7% or less from saturated fats, daily to have 5 or more servings of fruits and vegetables.  Biometrics: Pre Biometrics - 04/02/19 1510      Pre Biometrics   Height  '5\' 8"'$  (1.727 m)    Weight  175 lb 3.2 oz (79.5 kg)    BMI (Calculated)  26.65    Single Leg Stand  10.31 seconds        Nutrition Therapy Plan and Nutrition Goals: Nutrition Therapy & Goals - 04/04/19 1003      Nutrition Therapy   Diet  Low Na, HH diet    Protein (specify units)  65g    Fiber  30 grams    Whole Grain Foods  3 servings    Saturated Fats  12 max. grams    Fruits and Vegetables  5 servings/day    Sodium  1.5 grams      Personal Nutrition Goals   Nutrition Goal  ST: reduce Na LT: UBW 175#, wants to get down to 165# - wants to gain strength    Comments  Pt reports not eating properly, but is trying. Pt wants to build skills. B: 2 cups coffee with danish or cereal like raisin bran with whole milk and banana. Drinks water during the day. L:meatloaf, stewed apples with pintos or corn and okra etc. at [employer] (has this the 3 days coming to rehab) and frozen pizza (half) and boiled eggs w/ turnip greens and corned bread and meatloaf with baked beans/ peas and frozen vegetables with meat. Pt reports loving vegetables. S: small box of raisins most days, ritz crackers with jiff peanut butter with a glass of whole milk (1-2x/week). Pt reports loving his families homemade chocolate pie. Will sometimes have a can of tuna (bread and butter pickle juice) with some ritz crackers. Pt reports sodium is a problem for him. Pt also reports likes [place] stew. Discussed general [place] eating. Pt would like to also work on skill goals, pt reports mild ST memory issues, discussed giving him handouts to keep for review and providing him with  youtube skills videos (reports having access to computer).      Intervention Plan   Intervention  Nutrition handout(s) given to patient.;Prescribe, educate and counsel regarding individualized specific dietary modifications aiming towards targeted core components such as weight, hypertension, lipid management, diabetes, heart failure and other comorbidities.    Expected Outcomes  Short Term Goal: Understand basic principles of dietary content, such as calories, fat, sodium, cholesterol and nutrients.;Short Term Goal: A plan has been developed with personal nutrition goals set during dietitian appointment.;Long Term Goal: Adherence to prescribed nutrition plan.       Nutrition Assessments:   Nutrition Goals Re-Evaluation: Nutrition Goals Re-Evaluation    [place] Name 06/25/19 0750             Goals   Nutrition Goal  ST: reduce Na LT: UBW 175#, wants to get down to 165# - wants to gain strength  Comment  Continue with current changes       Expected Outcome  ST: reduce Na LT: UBW 175#, wants to get down to 165# - wants to gain strength          Nutrition Goals Discharge (Final Nutrition Goals Re-Evaluation): Nutrition Goals Re-Evaluation - 06/25/19 0750      Goals   Nutrition Goal  ST: reduce Na LT: UBW 175#, wants to get down to 165# - wants to gain strength    Comment  Continue with current changes    Expected Outcome  ST: reduce Na LT: UBW 175#, wants to get down to 165# - wants to gain strength       Psychosocial: Target Goals: Acknowledge presence or absence of significant depression and/or stress, maximize coping skills, provide positive support system. Participant is able to verbalize types and ability to use techniques and skills needed for reducing stress and depression.   Initial Review & Psychosocial Screening: Initial Psych Review & Screening - 04/01/19 1223      Initial Review   Current issues with  None Identified    Comments  Wife passed away this 10/02/2018 She  had been sick 5-6 years and Patrick Wilson cared for her.      Family Dynamics   Good Support System?  Yes   Daughter lives above [place], Son in [place], has niece lives up the street  He talks to [employer] all daily. Neighbors keep eye on [employer].     Barriers   Psychosocial barriers to participate in program  There are no identifiable barriers or psychosocial needs.;The patient should benefit from training in stress management and relaxation.      Screening Interventions   Interventions  Encouraged to exercise;Provide feedback about the scores to participant;To provide support and resources with identified psychosocial needs    Expected Outcomes  Short Term goal: Utilizing psychosocial counselor, staff and physician to assist with identification of specific Stressors or current issues interfering with healing process. Setting desired goal for each stressor or current issue identified.;Long Term Goal: Stressors or current issues are controlled or eliminated.;Short Term goal: Identification and review with participant of any Quality of Life or Depression concerns found by scoring the questionnaire.;Long Term goal: The participant improves quality of Life and PHQ9 Scores as seen by post scores and/or verbalization of changes       Quality of Life Scores:  Quality of Life - 04/02/19 1515      Quality of Life   Select  Quality of Life      Quality of Life Scores   Health/Function Pre  27.43 %    Socioeconomic Pre  30 %    Psych/Spiritual Pre  30 %    Family Pre  30 %    GLOBAL Pre  28.84 %      Scores of 19 and below usually indicate a poorer quality of life in these areas.  A difference of  2-3 points is a clinically meaningful difference.  A difference of 2-3 points in the total score of the Quality of Life Index has been associated with significant improvement in overall quality of life, self-image, physical symptoms, and general health in studies assessing change in quality of life.  PHQ-9: Recent  Review Flowsheet Data    Depression screen [facility] 2/9 04/02/2019   Decreased Interest 0   Down, Depressed, Hopeless 0   PHQ - 2 Score 0   Altered sleeping 0   Tired, decreased energy 2  Trouble concentrating 0   Moving slowly or fidgety/restless 0   Suicidal thoughts 0   PHQ-9 Score 2   Difficult doing work/chores Not difficult at all     Interpretation of Total Score  Total Score Depression Severity:  1-4 = Minimal depression, 5-9 = Mild depression, 10-14 = Moderate depression, 15-19 = Moderately severe depression, 20-27 = Severe depression   Psychosocial Evaluation and Intervention: Psychosocial Evaluation - 04/01/19 1238      Psychosocial Evaluation & Interventions   Interventions  Encouraged to exercise with the program and follow exercise prescription    Comments  Patrick Wilson has no barriers to attending the program. He is excited to get started and get out of the house.  His second wife passed away in 2022-09-13 this year. She had been sick and required his care for the past 5-6 years. He wants to lose some weight that he gained while he was not able to be active as he cared for his wife. He has a great support system, HIs children , a niece and his neighbors. He talks to them all daily. He gets around the neighborhood on his golfcart. He plans to attend the full program.    Expected Outcomes  STG: Patrick Wilson attends sessions LTG: Patrick Wilson is able to utilize his lifestyle modifications he ahs learned during the program.    Continue Psychosocial Services   Follow up required by staff       Psychosocial Re-Evaluation: Psychosocial Re-Evaluation    [place] Name 04/15/19 1451 05/20/19 1333 06/21/19 1200         Psychosocial Re-Evaluation   Current issues with  Current Stress Concerns  Current Stress Concerns  Current Stress Concerns     Comments  Patrick Wilson is doing well in rehab. He is working on quitting chewing tobacco.  He really wants to build his energy back up to be able to get out to hike again. His  son is coming to visit for Christmas week!!  He is looking forward to having him come visit.  He is taking safety protocols for his son. Overall he is feeling good.  Since he has started he has really enjoyed getting to see people and getting the exercise in regularly. He is impressed with how much his energy levels are starting to recover.  Patrick Wilson reports no new stress concerns and states that he has a strong support system.  He is feeling quite frustrated right now.  The cable/phone company has been giving him the run around.  He has been trying to get his TV and phone line working for almost three weeks.  They are now supposed to be coming on Monday afternoon.   He said he has spent almost 3 hours on the phone with them!  Exercise class is his social outlet and he misses it when he can't come.     Expected Outcomes  Short: Enjoy visit with son.  Long: Continue to attend exercise regularly.  Short: Continue to rely on support system for mental health needs. Long: continue to exercise to help manage stress.  Short: Get phone lines fixed.  Long: Continue stay positive.     Interventions  Encouraged to attend Cardiac Rehabilitation for the exercise  --  Encouraged to attend Cardiac Rehabilitation for the exercise     Continue Psychosocial Services   Follow up required by staff  --  Follow up required by staff        Psychosocial Discharge (Final Psychosocial Re-Evaluation): Psychosocial Re-Evaluation - 06/21/19  1200      Psychosocial Re-Evaluation   Current issues with  Current Stress Concerns    Comments  He is feeling quite frustrated right now.  The cable/phone company has been giving him the run around.  He has been trying to get his TV and phone line working for almost three weeks.  They are now supposed to be coming on Monday afternoon.   He said he has spent almost 3 hours on the phone with them!  Exercise class is his social outlet and he misses it when he can't come.    Expected Outcomes  Short:  Get phone lines fixed.  Long: Continue stay positive.    Interventions  Encouraged to attend Cardiac Rehabilitation for the exercise    Continue Psychosocial Services   Follow up required by staff       Vocational Rehabilitation: Provide vocational rehab assistance to qualifying candidates.   Vocational Rehab Evaluation & Intervention: Vocational Rehab - 04/01/19 1226      Initial Vocational Rehab Evaluation & Intervention   Assessment shows need for Vocational Rehabilitation  No       Education: Education Goals: Education classes will be provided on a variety of topics geared toward better understanding of heart health and risk factor modification. Participant will state understanding/return demonstration of topics presented as noted by education test scores.  Learning Barriers/Preferences: Learning Barriers/Preferences - 04/01/19 1226      Learning Barriers/Preferences   Learning Barriers  None    Learning Preferences  None       Education Topics:  AED/CPR: - Group verbal and written instruction with the use of models to demonstrate the basic use of the AED with the basic ABC's of resuscitation.   General Nutrition Guidelines/Fats and Fiber: -Group instruction provided by verbal, written material, models and posters to present the general guidelines for heart healthy nutrition. Gives an explanation and review of dietary fats and fiber.   Controlling Sodium/Reading Food Labels: -Group verbal and written material supporting the discussion of sodium use in heart healthy nutrition. Review and explanation with models, verbal and written materials for utilization of the food label.   Exercise Physiology & General Exercise Guidelines: - Group verbal and written instruction with models to review the exercise physiology of the cardiovascular system and associated critical values. Provides general exercise guidelines with specific guidelines to those with heart or lung disease.     Aerobic Exercise & Resistance Training: - Gives group verbal and written instruction on the various components of exercise. Focuses on aerobic and resistive training programs and the benefits of this training and how to safely progress through these programs..   Flexibility, Balance, Mind/Body Relaxation: Provides group verbal/written instruction on the benefits of flexibility and balance training, including mind/body exercise modes such as yoga, pilates and tai chi.  Demonstration and skill practice provided.   Cardiac Rehab from 04/04/2019 in [facility] Cardiac and Pulmonary Rehab  Date  04/04/19  Educator  AS  Instruction Review Code  1- Verbalizes Understanding      Stress and Anxiety: - Provides group verbal and written instruction about the health risks of elevated stress and causes of high stress.  Discuss the correlation between heart/lung disease and anxiety and treatment options. Review healthy ways to manage with stress and anxiety.   Depression: - Provides group verbal and written instruction on the correlation between heart/lung disease and depressed mood, treatment options, and the stigmas associated with seeking treatment.   Anatomy & Physiology of the  Heart: - Group verbal and written instruction and models provide basic cardiac anatomy and physiology, with the coronary electrical and arterial systems. Review of Valvular disease and Heart Failure   Cardiac Procedures: - Group verbal and written instruction to review commonly prescribed medications for heart disease. Reviews the medication, class of the drug, and side effects. Includes the steps to properly store meds and maintain the prescription regimen. (beta blockers and nitrates)   Cardiac Medications I: - Group verbal and written instruction to review commonly prescribed medications for heart disease. Reviews the medication, class of the drug, and side effects. Includes the steps to properly store meds and maintain  the prescription regimen.   Cardiac Medications II: -Group verbal and written instruction to review commonly prescribed medications for heart disease. Reviews the medication, class of the drug, and side effects. (all other drug classes)    Go Sex-Intimacy & Heart Disease, Get SMART - Goal Setting: - Group verbal and written instruction through game format to discuss heart disease and the return to sexual intimacy. Provides group verbal and written material to discuss and apply goal setting through the application of the S.M.A.R.T. Method.   Other Matters of the Heart: - Provides group verbal, written materials and models to describe Stable Angina and Peripheral Artery. Includes description of the disease process and treatment options available to the cardiac patient.   Exercise & Equipment Safety: - Individual verbal instruction and demonstration of equipment use and safety with use of the equipment.   Cardiac Rehab from 04/04/2019 in [facility] Cardiac and Pulmonary Rehab  Date  04/02/19  Educator  AS  Instruction Review Code  1- Verbalizes Understanding      Infection Prevention: - Provides verbal and written material to individual with discussion of infection control including proper hand washing and proper equipment cleaning during exercise session.   Cardiac Rehab from 04/04/2019 in [facility] Cardiac and Pulmonary Rehab  Date  04/02/19  Educator  AS  Instruction Review Code  1- Verbalizes Understanding      Falls Prevention: - Provides verbal and written material to individual with discussion of falls prevention and safety.   Cardiac Rehab from 04/04/2019 in [facility] Cardiac and Pulmonary Rehab  Date  04/02/19  Educator  AS  Instruction Review Code  1- Verbalizes Understanding      Diabetes: - Individual verbal and written instruction to review signs/symptoms of diabetes, desired ranges of glucose level fasting, after meals and with exercise. Acknowledge that pre and post exercise  glucose checks will be done for 3 sessions at entry of program.   Know Your Numbers and Risk Factors: -Group verbal and written instruction about important numbers in your health.  Discussion of what are risk factors and how they play a role in the disease process.  Review of Cholesterol, Blood Pressure, Diabetes, and BMI and the role they play in your overall health.   Sleep Hygiene: -Provides group verbal and written instruction about how sleep can affect your health.  Define sleep hygiene, discuss sleep cycles and impact of sleep habits. Review good sleep hygiene tips.    Other: -Provides group and verbal instruction on various topics (see comments)   Knowledge Questionnaire Score: Knowledge Questionnaire Score - 04/02/19 1514      Knowledge Questionnaire Score   Pre Score  19/26 nutrition       Core Components/Risk Factors/Patient Goals at Admission: Personal Goals and Risk Factors at Admission - 04/02/19 1511      Core Components/Risk Factors/Patient Goals on  Admission    Weight Management  Weight Maintenance;Yes    Intervention  Weight Management: Develop a combined nutrition and exercise program designed to reach desired caloric intake, while maintaining appropriate intake of nutrient and fiber, sodium and fats, and appropriate energy expenditure required for the weight goal.;Weight Management: Provide education and appropriate resources to help participant work on and attain dietary goals.    Admit Weight  175 lb 3.2 oz (79.5 kg)    Expected Outcomes  Short Term: Continue to assess and modify interventions until short term weight is achieved;Long Term: Adherence to nutrition and physical activity/exercise program aimed toward attainment of established weight goal;Weight Maintenance: Understanding of the daily nutrition guidelines, which includes 25-35% calories from fat, 7% or less cal from saturated fats, less than '200mg'$  cholesterol, less than 1.5gm of sodium, & 5 or more  servings of fruits and vegetables daily    Intervention  Assist the participant in steps to quit. Provide individualized education and counseling about committing to Tobacco Cessation, relapse prevention, and pharmacological support that can be provided by physician.;Advice worker, assist with locating and accessing local/national Quit Smoking programs, and support quit date choice.    Expected Outcomes  Short Term: Will demonstrate readiness to quit, by selecting a quit date.;Short Term: Will quit all tobacco product use, adhering to prevention of relapse plan.;Long Term: Complete abstinence from all tobacco products for at least 12 months from quit date.       Core Components/Risk Factors/Patient Goals Review:  Goals and Risk Factor Review    Row Name 04/15/19 1447 05/20/19 1332 06/21/19 1202         Core Components/Risk Factors/Patient Goals Review   Personal Goals Review  Weight Management/Obesity;Tobacco Cessation;Hypertension;Lipids  Weight Management/Obesity;Tobacco Cessation;Hypertension;Lipids  Weight Management/Obesity;Hypertension     Review  Patrick Wilson is doing well in rehab.  He is already down to 165 lb at home!  He is pleased with his progress and wants to continue to build muscle and energy levels.  Blood pressures have been good and he does check them at home.  He notices when it runs high/low based on how he feels.  He is doing well on his medications.  He has cut back on chewing tobacco and is making strides towards quitting altogether.  He wants to go slowly but is mentally ready to quit.  Patrick Wilson reports that he is taking his meds a prescribed by his doctor. He is maintaining a steady weight. Does not smoke, but still uses chewing tobacco.  His pressures have been doing good and his weight has been fairly steady. Surprisingly he is not gaining weight since he makes an afternoon out of his exercise session by going to [employer] after class for late lunch/early dinner.      Expected Outcomes  Short: Continue to work toward tobacco cessation.  Long: Continue to build stamina.  Short: Continue to work toward tobacco cessation. Long: Continue to build stamina.  Short: Continue to monitor weight with his diet.  Long; Continue to montior risk factors.        Core Components/Risk Factors/Patient Goals at Discharge (Final Review):  Goals and Risk Factor Review - 06/21/19 1202      Core Components/Risk Factors/Patient Goals Review   Personal Goals Review  Weight Management/Obesity;Hypertension    Review  His pressures have been doing good and his weight has been fairly steady. Surprisingly he is not gaining weight since he makes an afternoon out of his exercise session by going to  [employer] after class for late lunch/early dinner.    Expected Outcomes  Short: Continue to monitor weight with his diet.  Long; Continue to montior risk factors.       ITP Comments: ITP Comments    Row Name 04/01/19 1256 04/02/19 1521 04/03/19 1423 04/04/19 1033 05/01/19 0844   ITP Comments  Virtual Orientation call completed. Has appointment tomorrow with Ep for Eval and Gym Orientation. Diagnosis documentation can be found in [facility] 10/17/2018.  6MWT completed and initial ITP created and sent to Dr Patrick Wilson.  First full day of exercise!  Patient was oriented to gym and equipment including functions, settings, policies, and procedures.  Patient's individual exercise prescription and treatment plan were reviewed.  All starting workloads were established based on the results of the 6 minute walk test done at initial orientation visit.  The plan for exercise progression was also introduced and progression will be customized based on patient's performance and goals  Completed Initial RD eval  30 day review competed . ITP sent to Dr Emily Filbert for review, changes as needed and ITP approval signature   [place] Name 05/06/19 1348 05/20/19 1334 05/22/19 1422 05/27/19 1424 05/29/19 1212   ITP Comments   Checked in with patient since he is not in person this week. Stated he was doing really well. He is walking and doing exercises with weights. He reports that he is very thankful for all we have done and wants to keep working hard to feel better.  Virtual F/U call today:Patrick Wilson reported that he is exercising about every day. He is walking, using a stepper, using weights, and stretching. He stated that he is very committed to his cardiac rehab program and is anxious to get back to coming to [facility] 3 days a week. He stated his commitment to continue with home exercise until the program re-opens fully. Patrick Wilson reports that he is taking his meds a prescribed by his doctor. He is maintaining a steady weight. Does not smoke, but still uses chewing tobacco. Patrick Wilson reports no new stress concerns and states that he has a strong support system.  Patrick Wilson has not attended in person since last review.  Cardiac Rehab is operating at minimal capacity, RD unable to get nutrition re-eval this 30 day cycle.  30 day review completed. ITP sent to Dr. Emily Filbert, Medical Director of Cardiac and Pulmonary Rehab. Continue with ITP unless changes are made by physician.  Department operating under reduced schedule until further notice by request from hospital leadership.   [place] Name 06/03/19 1318 06/21/19 1157 06/26/19 0630       ITP Comments  Virtual call completed today. Calls are done every week that patient is not attending an onsite exercise session during the COVID 19 PHE Crisis. Today's call included review of : their home exercise, their personal goals, a psychosocial review.Patrick Wilson reports exercising very day using a stepper and walking. He reports using 5 lb weights for resistive work and has no symptoms or concerns.  Virtual follow up appt completed today for goal review.  Pt also noted that he will be out on Monday as the cable company is coming to fix his phone line that afternoon.  30 day chart review completed. ITP sent to Dr Zachery Dakins Medical Director, for review,changes as needed and signature.        Comments:

## 2019-06-27 ENCOUNTER — Encounter: Payer: Medicare HMO | Admitting: *Deleted

## 2019-06-27 ENCOUNTER — Other Ambulatory Visit: Payer: Self-pay

## 2019-06-27 DIAGNOSIS — Z85828 Personal history of other malignant neoplasm of skin: Secondary | ICD-10-CM | POA: Diagnosis not present

## 2019-06-27 DIAGNOSIS — I213 ST elevation (STEMI) myocardial infarction of unspecified site: Secondary | ICD-10-CM

## 2019-06-27 DIAGNOSIS — Z7982 Long term (current) use of aspirin: Secondary | ICD-10-CM | POA: Diagnosis not present

## 2019-06-27 DIAGNOSIS — I10 Essential (primary) hypertension: Secondary | ICD-10-CM | POA: Diagnosis not present

## 2019-06-27 DIAGNOSIS — I252 Old myocardial infarction: Secondary | ICD-10-CM | POA: Diagnosis not present

## 2019-06-27 DIAGNOSIS — F329 Major depressive disorder, single episode, unspecified: Secondary | ICD-10-CM | POA: Diagnosis not present

## 2019-06-27 DIAGNOSIS — I2510 Atherosclerotic heart disease of native coronary artery without angina pectoris: Secondary | ICD-10-CM | POA: Diagnosis not present

## 2019-06-27 DIAGNOSIS — Z79899 Other long term (current) drug therapy: Secondary | ICD-10-CM | POA: Diagnosis not present

## 2019-06-27 DIAGNOSIS — K219 Gastro-esophageal reflux disease without esophagitis: Secondary | ICD-10-CM | POA: Diagnosis not present

## 2019-06-27 NOTE — Progress Notes (Signed)
Daily Session Note  Patient Details  Name: Patrick Wilson MRN: [id number] Date of Birth: 02-18-1934 Referring Provider:     Cardiac Rehab from 04/02/2019 in [facility] Cardiac and Pulmonary Rehab  Referring Provider  [name]      Encounter Date: 06/27/2019  Check In: Session Check In - 06/27/19 1419      Check-In   Supervising physician immediately available to respond to emergencies  See telemetry face sheet for immediately available ER MD    Location  [facility]-Cardiac & Pulmonary Rehab    Staff Present  Nada Maclachlan, BA, ACSM CEP, Exercise Physiologist;Jessica Pelham, MA, RCEP, CCRP, CCET;Uniqua Kihn, RN, BSN, CCRP    Virtual Visit  No    Medication changes reported      No    Fall or balance concerns reported     No    Warm-up and Cool-down  Performed on first and last piece of equipment    Resistance Training Performed  Yes    VAD Patient?  No    PAD/SET Patient?  No      Pain Assessment   Currently in Pain?  No/denies          Social History   Tobacco Use  Smoking Status Never Smoker  Smokeless Tobacco Current User  . Types: Chew  Tobacco Comment   May be ready to quit.    Goals Met:  Independence with exercise equipment Exercise tolerated well No report of cardiac concerns or symptoms  Goals Unmet:  Not Applicable  Comments: Pt able to follow exercise prescription today without complaint.  Will continue to monitor for progression.    Dr. Emily Filbert is Medical Director for [place] and [facility].

## 2019-07-01 DIAGNOSIS — Z87891 Personal history of nicotine dependence: Secondary | ICD-10-CM | POA: Diagnosis not present

## 2019-07-01 DIAGNOSIS — J3489 Other specified disorders of nose and nasal sinuses: Secondary | ICD-10-CM | POA: Diagnosis not present

## 2019-07-01 DIAGNOSIS — Z0000 Encounter for general adult medical examination without abnormal findings: Secondary | ICD-10-CM | POA: Diagnosis not present

## 2019-07-01 DIAGNOSIS — I2510 Atherosclerotic heart disease of native coronary artery without angina pectoris: Secondary | ICD-10-CM | POA: Diagnosis not present

## 2019-07-01 DIAGNOSIS — I739 Peripheral vascular disease, unspecified: Secondary | ICD-10-CM | POA: Diagnosis not present

## 2019-07-01 DIAGNOSIS — L570 Actinic keratosis: Secondary | ICD-10-CM | POA: Diagnosis not present

## 2019-07-01 DIAGNOSIS — I959 Hypotension, unspecified: Secondary | ICD-10-CM | POA: Diagnosis not present

## 2019-07-02 DIAGNOSIS — I739 Peripheral vascular disease, unspecified: Secondary | ICD-10-CM | POA: Diagnosis not present

## 2019-07-02 DIAGNOSIS — I10 Essential (primary) hypertension: Secondary | ICD-10-CM | POA: Diagnosis not present

## 2019-07-02 DIAGNOSIS — I2119 ST elevation (STEMI) myocardial infarction involving other coronary artery of inferior wall: Secondary | ICD-10-CM | POA: Diagnosis not present

## 2019-07-03 ENCOUNTER — Encounter: Payer: Medicare HMO | Attending: Internal Medicine | Admitting: *Deleted

## 2019-07-03 ENCOUNTER — Other Ambulatory Visit: Payer: Self-pay

## 2019-07-03 DIAGNOSIS — F329 Major depressive disorder, single episode, unspecified: Secondary | ICD-10-CM | POA: Diagnosis not present

## 2019-07-03 DIAGNOSIS — Z85828 Personal history of other malignant neoplasm of skin: Secondary | ICD-10-CM | POA: Diagnosis not present

## 2019-07-03 DIAGNOSIS — I213 ST elevation (STEMI) myocardial infarction of unspecified site: Secondary | ICD-10-CM | POA: Insufficient documentation

## 2019-07-03 DIAGNOSIS — I2510 Atherosclerotic heart disease of native coronary artery without angina pectoris: Secondary | ICD-10-CM | POA: Diagnosis not present

## 2019-07-03 DIAGNOSIS — Z79899 Other long term (current) drug therapy: Secondary | ICD-10-CM | POA: Insufficient documentation

## 2019-07-03 DIAGNOSIS — I252 Old myocardial infarction: Secondary | ICD-10-CM | POA: Diagnosis not present

## 2019-07-03 DIAGNOSIS — Z7982 Long term (current) use of aspirin: Secondary | ICD-10-CM | POA: Diagnosis not present

## 2019-07-03 DIAGNOSIS — Z791 Long term (current) use of non-steroidal anti-inflammatories (NSAID): Secondary | ICD-10-CM | POA: Insufficient documentation

## 2019-07-03 DIAGNOSIS — K219 Gastro-esophageal reflux disease without esophagitis: Secondary | ICD-10-CM | POA: Diagnosis not present

## 2019-07-03 DIAGNOSIS — I10 Essential (primary) hypertension: Secondary | ICD-10-CM | POA: Insufficient documentation

## 2019-07-03 NOTE — Progress Notes (Signed)
Daily Session Note  Patient Details  Name: Patrick Wilson MRN: [id number] Date of Birth: 1933/12/06 Referring Provider:     Cardiac Rehab from 04/02/2019 in [facility] Cardiac and Pulmonary Rehab  Referring Provider  [name]      Encounter Date: 07/03/2019  Check In: Session Check In - 07/03/19 1320      Check-In   Staff Present  Heath Lark, RN, BSN, CCRP;Meredith Sherryll Burger, RN BSN;Joseph Foy [place], [place], ACSM CEP, Exercise Physiologist    Virtual Visit  No    Medication changes reported      No    Fall or balance concerns reported     No    Warm-up and Cool-down  Performed on first and last piece of equipment    Resistance Training Performed  Yes    VAD Patient?  No    PAD/SET Patient?  No      Pain Assessment   Currently in Pain?  No/denies          Social History   Tobacco Use  Smoking Status Never Smoker  Smokeless Tobacco Current User  . Types: Chew  Tobacco Comment   May be ready to quit.    Goals Met:  Independence with exercise equipment Exercise tolerated well No report of cardiac concerns or symptoms  Goals Unmet:  Not Applicable  Comments: Pt able to follow exercise prescription today without complaint.  Will continue to monitor for progression.    Dr. Emily Filbert is Medical Director for [place] and [facility].

## 2019-07-04 ENCOUNTER — Telehealth: Payer: Self-pay

## 2019-07-04 NOTE — Telephone Encounter (Signed)
Pt called to let us know that his BP is still low, he contacted his doctor and is waiting for them to get back to him. Pt intends to come back Monday 3/8 to rehab.

## 2019-07-05 DIAGNOSIS — H353131 Nonexudative age-related macular degeneration, bilateral, early dry stage: Secondary | ICD-10-CM | POA: Diagnosis not present

## 2019-07-08 ENCOUNTER — Encounter: Payer: Medicare HMO | Admitting: *Deleted

## 2019-07-08 ENCOUNTER — Other Ambulatory Visit: Payer: Self-pay

## 2019-07-08 DIAGNOSIS — F329 Major depressive disorder, single episode, unspecified: Secondary | ICD-10-CM | POA: Diagnosis not present

## 2019-07-08 DIAGNOSIS — K219 Gastro-esophageal reflux disease without esophagitis: Secondary | ICD-10-CM | POA: Diagnosis not present

## 2019-07-08 DIAGNOSIS — I213 ST elevation (STEMI) myocardial infarction of unspecified site: Secondary | ICD-10-CM | POA: Diagnosis not present

## 2019-07-08 DIAGNOSIS — Z79899 Other long term (current) drug therapy: Secondary | ICD-10-CM | POA: Diagnosis not present

## 2019-07-08 DIAGNOSIS — I2510 Atherosclerotic heart disease of native coronary artery without angina pectoris: Secondary | ICD-10-CM | POA: Diagnosis not present

## 2019-07-08 DIAGNOSIS — I252 Old myocardial infarction: Secondary | ICD-10-CM | POA: Diagnosis not present

## 2019-07-08 DIAGNOSIS — Z85828 Personal history of other malignant neoplasm of skin: Secondary | ICD-10-CM | POA: Diagnosis not present

## 2019-07-08 DIAGNOSIS — Z7982 Long term (current) use of aspirin: Secondary | ICD-10-CM | POA: Diagnosis not present

## 2019-07-08 DIAGNOSIS — I10 Essential (primary) hypertension: Secondary | ICD-10-CM | POA: Diagnosis not present

## 2019-07-08 NOTE — Progress Notes (Signed)
Daily Session Note  Patient Details  Name: Patrick Wilson MRN: [id number] Date of Birth: [birth year] Referring Provider:     Cardiac Rehab from 04/02/2019 in [facility] Cardiac and Pulmonary Rehab  Referring Provider  [name]      Encounter Date: 07/08/2019  Check In: Session Check In - 07/08/19 1323      Check-In   Supervising physician immediately available to respond to emergencies  See telemetry face sheet for immediately available ER MD    Location  [facility]-Cardiac & Pulmonary Rehab    Staff Present  Heath Lark, RN, BSN, CCRP;Jessica Turkey, MA, RCEP, CCRP, CCET;Melissa [place] RDN, LDN    Virtual Visit  No    Medication changes reported      No    Fall or balance concerns reported     No    Warm-up and Cool-down  Performed on first and last piece of equipment    Resistance Training Performed  Yes    VAD Patient?  No    PAD/SET Patient?  No      Pain Assessment   Currently in Pain?  No/denies          Social History   Tobacco Use  Smoking Status Never Smoker  Smokeless Tobacco Current User  . Types: Chew  Tobacco Comment   May be ready to quit.    Goals Met:  Independence with exercise equipment Exercise tolerated well No report of cardiac concerns or symptoms  Goals Unmet:  Not Applicable  Comments: Pt able to follow exercise prescription today without complaint.  Will continue to monitor for progression.    Dr. Emily Filbert is Medical Director for [place] and [facility].

## 2019-07-10 ENCOUNTER — Other Ambulatory Visit: Payer: Self-pay

## 2019-07-10 ENCOUNTER — Encounter: Payer: Medicare HMO | Admitting: *Deleted

## 2019-07-10 DIAGNOSIS — Z85828 Personal history of other malignant neoplasm of skin: Secondary | ICD-10-CM | POA: Diagnosis not present

## 2019-07-10 DIAGNOSIS — Z7982 Long term (current) use of aspirin: Secondary | ICD-10-CM | POA: Diagnosis not present

## 2019-07-10 DIAGNOSIS — I10 Essential (primary) hypertension: Secondary | ICD-10-CM | POA: Diagnosis not present

## 2019-07-10 DIAGNOSIS — F329 Major depressive disorder, single episode, unspecified: Secondary | ICD-10-CM | POA: Diagnosis not present

## 2019-07-10 DIAGNOSIS — Z79899 Other long term (current) drug therapy: Secondary | ICD-10-CM | POA: Diagnosis not present

## 2019-07-10 DIAGNOSIS — K219 Gastro-esophageal reflux disease without esophagitis: Secondary | ICD-10-CM | POA: Diagnosis not present

## 2019-07-10 DIAGNOSIS — I252 Old myocardial infarction: Secondary | ICD-10-CM | POA: Diagnosis not present

## 2019-07-10 DIAGNOSIS — I213 ST elevation (STEMI) myocardial infarction of unspecified site: Secondary | ICD-10-CM

## 2019-07-10 DIAGNOSIS — I2510 Atherosclerotic heart disease of native coronary artery without angina pectoris: Secondary | ICD-10-CM | POA: Diagnosis not present

## 2019-07-10 NOTE — Progress Notes (Signed)
Daily Session Note  Patient Details  Name: Patrick Wilson MRN: [id number] Date of Birth: 02-12-1934 Referring Provider:     Cardiac Rehab from 04/02/2019 in [facility] Cardiac and Pulmonary Rehab  Referring Provider  [name]      Encounter Date: 07/10/2019  Check In: Session Check In - 07/10/19 1304      Check-In   Supervising physician immediately available to respond to emergencies  See telemetry face sheet for immediately available ER MD    Location  [facility]-Cardiac & Pulmonary Rehab    Staff Present  Renita Papa, RN BSN;Joseph Hood RCP,RRT,BSRT;Jeanna Durrell BS, Exercise Physiologist    Virtual Visit  No    Medication changes reported      No    Fall or balance concerns reported     No    Warm-up and Cool-down  Performed on first and last piece of equipment    Resistance Training Performed  Yes    VAD Patient?  No    PAD/SET Patient?  No      Pain Assessment   Currently in Pain?  No/denies          Social History   Tobacco Use  Smoking Status Never Smoker  Smokeless Tobacco Current User  . Types: Chew  Tobacco Comment   May be ready to quit.    Goals Met:  Independence with exercise equipment Exercise tolerated well No report of cardiac concerns or symptoms Strength training completed today  Goals Unmet:  Not Applicable  Comments: Pt able to follow exercise prescription today without complaint.  Will continue to monitor for progression.    Dr. Emily Filbert is Medical Director for [place] and [facility].

## 2019-07-15 ENCOUNTER — Telehealth: Payer: Self-pay

## 2019-07-15 DIAGNOSIS — R001 Bradycardia, unspecified: Secondary | ICD-10-CM | POA: Diagnosis not present

## 2019-07-15 DIAGNOSIS — I959 Hypotension, unspecified: Secondary | ICD-10-CM | POA: Diagnosis not present

## 2019-07-15 DIAGNOSIS — L570 Actinic keratosis: Secondary | ICD-10-CM | POA: Diagnosis not present

## 2019-07-15 DIAGNOSIS — B079 Viral wart, unspecified: Secondary | ICD-10-CM | POA: Diagnosis not present

## 2019-07-15 DIAGNOSIS — E869 Volume depletion, unspecified: Secondary | ICD-10-CM | POA: Diagnosis not present

## 2019-07-15 DIAGNOSIS — M549 Dorsalgia, unspecified: Secondary | ICD-10-CM | POA: Diagnosis not present

## 2019-07-15 DIAGNOSIS — Z87891 Personal history of nicotine dependence: Secondary | ICD-10-CM | POA: Diagnosis not present

## 2019-07-15 NOTE — Telephone Encounter (Signed)
Darr said his Dr told him not to come to cardiac rehab this week as he is still having issues with BP.

## 2019-07-22 ENCOUNTER — Encounter: Payer: Medicare HMO | Admitting: *Deleted

## 2019-07-22 ENCOUNTER — Other Ambulatory Visit: Payer: Self-pay

## 2019-07-22 DIAGNOSIS — I10 Essential (primary) hypertension: Secondary | ICD-10-CM | POA: Diagnosis not present

## 2019-07-22 DIAGNOSIS — I2510 Atherosclerotic heart disease of native coronary artery without angina pectoris: Secondary | ICD-10-CM | POA: Diagnosis not present

## 2019-07-22 DIAGNOSIS — K219 Gastro-esophageal reflux disease without esophagitis: Secondary | ICD-10-CM | POA: Diagnosis not present

## 2019-07-22 DIAGNOSIS — Z79899 Other long term (current) drug therapy: Secondary | ICD-10-CM | POA: Diagnosis not present

## 2019-07-22 DIAGNOSIS — F329 Major depressive disorder, single episode, unspecified: Secondary | ICD-10-CM | POA: Diagnosis not present

## 2019-07-22 DIAGNOSIS — I252 Old myocardial infarction: Secondary | ICD-10-CM | POA: Diagnosis not present

## 2019-07-22 DIAGNOSIS — Z85828 Personal history of other malignant neoplasm of skin: Secondary | ICD-10-CM | POA: Diagnosis not present

## 2019-07-22 DIAGNOSIS — Z7982 Long term (current) use of aspirin: Secondary | ICD-10-CM | POA: Diagnosis not present

## 2019-07-22 DIAGNOSIS — I213 ST elevation (STEMI) myocardial infarction of unspecified site: Secondary | ICD-10-CM

## 2019-07-22 NOTE — Progress Notes (Signed)
Daily Session Note  Patient Details  Name: Patrick Wilson MRN: [id number] Date of Birth: 09/06/1933 Referring Provider:     Cardiac Rehab from 04/02/2019 in [facility]  Referring Provider  [name]      Encounter Date: 07/22/2019  Check In: Session Check In - 07/22/19 1322      Check-In   Supervising physician immediately available to respond to emergencies  See telemetry face sheet for immediately available ER MD    Location  [facility]    Staff Present  Heath Lark, RN, BSN, CCRP;Meredith [place], RN BSN;Jessica [place], MA, RCEP, CCRP, [place], BS, ACSM CEP, Exercise Physiologist;Joseph [place] RCP,RRT,BSRT    Virtual Visit  No    Medication changes reported      No    Fall or balance concerns reported     No    Warm-up and Cool-down  Performed on first and last piece of equipment    Resistance Training Performed  Yes    VAD Patient?  No    PAD/SET Patient?  No      Pain Assessment   Currently in Pain?  No/denies          Social History   Tobacco Use  Smoking Status Never Smoker  Smokeless Tobacco Current User  . Types: Chew  Tobacco Comment   May be ready to quit.    Goals Met:  Independence with exercise equipment Exercise tolerated well No report of cardiac concerns or symptoms  Goals Unmet:  Not Applicable  Comments: Pt able to follow exercise prescription today without complaint.  Will continue to monitor for progression.    Dr. Emily Filbert is Medical Director for [place] [facility].

## 2019-07-24 ENCOUNTER — Encounter: Payer: Self-pay | Admitting: *Deleted

## 2019-07-24 ENCOUNTER — Encounter: Payer: Medicare HMO | Admitting: *Deleted

## 2019-07-24 ENCOUNTER — Other Ambulatory Visit: Payer: Self-pay

## 2019-07-24 DIAGNOSIS — I10 Essential (primary) hypertension: Secondary | ICD-10-CM | POA: Diagnosis not present

## 2019-07-24 DIAGNOSIS — Z7982 Long term (current) use of aspirin: Secondary | ICD-10-CM | POA: Diagnosis not present

## 2019-07-24 DIAGNOSIS — I213 ST elevation (STEMI) myocardial infarction of unspecified site: Secondary | ICD-10-CM

## 2019-07-24 DIAGNOSIS — I252 Old myocardial infarction: Secondary | ICD-10-CM | POA: Diagnosis not present

## 2019-07-24 DIAGNOSIS — I2510 Atherosclerotic heart disease of native coronary artery without angina pectoris: Secondary | ICD-10-CM | POA: Diagnosis not present

## 2019-07-24 DIAGNOSIS — Z79899 Other long term (current) drug therapy: Secondary | ICD-10-CM | POA: Diagnosis not present

## 2019-07-24 DIAGNOSIS — F329 Major depressive disorder, single episode, unspecified: Secondary | ICD-10-CM | POA: Diagnosis not present

## 2019-07-24 DIAGNOSIS — K219 Gastro-esophageal reflux disease without esophagitis: Secondary | ICD-10-CM | POA: Diagnosis not present

## 2019-07-24 DIAGNOSIS — Z85828 Personal history of other malignant neoplasm of skin: Secondary | ICD-10-CM | POA: Diagnosis not present

## 2019-07-24 NOTE — Progress Notes (Signed)
Cardiac Individual Treatment Plan  Patient Details  Name: Patrick Wilson MRN: [id number] Date of Birth: [birth year] Referring Provider:     Cardiac Rehab from 04/02/2019 in [facility] Cardiac and Pulmonary Rehab  Referring Provider  Patrick Heck [Paras[place]]      Initial Encounter Date:    Cardiac Rehab from 04/02/2019 in [facility] Cardiac and Pulmonary Rehab  Date  04/02/19      Visit Diagnosis: ST elevation myocardial infarction (STEMI), unspecified artery ([place])  Patient's Home Medications on Admission:  Current Outpatient Medications:  .  ALPRAZolam (XANAX) 0.25 MG tablet, Take 0.25 mg by mouth 2 (two) times a day., Disp: , Rfl:  .  aspirin EC 81 MG tablet, Take 81 mg by mouth daily., Disp: , Rfl:  .  atorvastatin (LIPITOR) 80 MG tablet, Take 1 tablet (80 mg total) by mouth at bedtime., Disp: 30 tablet, Rfl: 1 .  atorvastatin (LIPITOR) 80 MG tablet, Take by mouth., Disp: , Rfl:  .  B Complex Vitamins (VITAMIN B COMPLEX PO), Take 1 tablet by mouth daily. , Disp: , Rfl:  .  Denture Care Products (DENTURE ADHESIVE) CREA, Apply 1 application topically daily as needed. , Disp: , Rfl:  .  diphenhydrAMINE (EQ ALLERGY) 25 mg capsule, Take 25 mg by mouth every 6 (six) hours as needed for allergies., Disp: , Rfl:  .  docusate sodium (COLACE) 100 MG capsule, Take 1 capsule (100 mg total) by mouth 2 (two) times daily. (Patient not taking: Reported on 04/01/2019), Disp: 60 capsule, Rfl: 0 .  donepezil (ARICEPT) 10 MG tablet, Take by mouth., Disp: , Rfl:  .  lisinopril (ZESTRIL) 2.5 MG tablet, Take 1 tablet (2.5 mg total) by mouth daily., Disp: 30 tablet, Rfl: 1 .  magnesium gluconate (MAGONATE) 500 MG tablet, Take 500 mg by mouth daily., Disp: , Rfl:  .  meloxicam (MOBIC) 15 MG tablet, Take 15 mg by mouth at bedtime., Disp: , Rfl:  .  metoCLOPramide (REGLAN) 5 MG tablet, Take 5 mg by mouth 2 (two) times daily., Disp: , Rfl:  .  metoprolol tartrate (LOPRESSOR) 25 MG tablet, Take 0.5 tablets (12.5 mg total) by mouth  every 12 (twelve) hours., Disp: 30 tablet, Rfl: 1 .  Multiple Vitamins-Minerals (BL CENTURY SENIOR PO), Take 1 tablet by mouth daily., Disp: , Rfl:  .  nitroGLYCERIN (NITROSTAT) 0.4 MG SL tablet, Place 0.4 mg under the tongue every 5 (five) minutes as needed for chest pain. Reported on 11/13/2015, Disp: , Rfl:  .  olmesartan (BENICAR) 20 MG tablet, Take by mouth., Disp: , Rfl:  .  Omega-3 Fatty Acids (FISH OIL) 1000 MG CAPS, Take 1,000 mg by mouth daily., Disp: , Rfl:  .  omeprazole (PRILOSEC) 40 MG capsule, Take 40 mg by mouth 2 (two) times daily., Disp: , Rfl:  .  sucralfate (CARAFATE) 1 g tablet, Take 1 g by mouth 4 (four) times daily., Disp: , Rfl:  .  tamsulosin (FLOMAX) 0.4 MG CAPS capsule, Take 1 capsule (0.4 mg total) by mouth daily., Disp: 30 capsule, Rfl: 0 .  ticagrelor (BRILINTA) 90 MG TABS tablet, Take by mouth., Disp: , Rfl:  .  traZODone (DESYREL) 100 MG tablet, Take 100 mg by mouth at bedtime., Disp: , Rfl:  .  venlafaxine XR (EFFEXOR-XR) 37.5 MG 24 hr capsule, Take 37.5 mg by mouth daily., Disp: , Rfl: 3  Past Medical History: Past Medical History:  Diagnosis Date  . Cancer ([place])    SKIN  . Coronary artery disease   .  Depression   . GERD (gastroesophageal reflux disease)   . History of kidney stones   . Hypertension   . Myocardial infarction ([place])    2012    Tobacco Use: Social History   Tobacco Use  Smoking Status Never Smoker  Smokeless Tobacco Current User  . Types: Chew  Tobacco Comment   May be ready to quit.    Labs: Recent Review Flowsheet Data    Labs for ITP Cardiac and Pulmonary Rehab Latest Ref Rng & Units 10/17/2018 10/18/2018   Cholestrol 0 - 200 mg/dL 138 -   LDLCALC 0 - 99 mg/dL 54 -   HDL >40 mg/dL 64 -   Trlycerides <150 mg/dL 101 -   Hemoglobin A1c 4.8 - 5.6 % - 5.5       Exercise Target Goals: Exercise Program Goal: Individual exercise prescription set using results from initial 6 min walk test and THRR while considering  patient's  activity barriers and safety.   Exercise Prescription Goal: Initial exercise prescription builds to 30-45 minutes a day of aerobic activity, 2-3 days per week.  Home exercise guidelines will be given to patient during program as part of exercise prescription that the participant will acknowledge.  Activity Barriers & Risk Stratification: Activity Barriers & Cardiac Risk Stratification - 04/01/19 1222      Activity Barriers & Cardiac Risk Stratification   Activity Barriers  None    Cardiac Risk Stratification  High       6 Minute Walk: 6 Minute Walk    Row Name 04/02/19 1458         6 Minute Walk   Phase  Initial     Distance  1600 feet     Walk Time  6 minutes     # of Rest Breaks  0     MPH  3.03     METS  2.98     RPE  12     Perceived Dyspnea   1     VO2 Peak  10.44     Symptoms  No     Resting HR  65 bpm     Resting BP  134/68     Resting Oxygen Saturation   97 %     Exercise Oxygen Saturation  during 6 min walk  95 %     Max Ex. HR  2.31 bpm     Max Ex. BP  148/64     2 Minute Post BP  138/74        Oxygen Initial Assessment:   Oxygen Re-Evaluation:   Oxygen Discharge (Final Oxygen Re-Evaluation):   Initial Exercise Prescription: Initial Exercise Prescription - 04/02/19 1500      Date of Initial Exercise RX and Referring Provider   Date  04/02/19    Referring Provider  Patrick Wilson     Treadmill   MPH  2.7    Grade  0    Minutes  15    METs  3.07      Recumbant Bike   Level  3    RPM  60    Watts  25    Minutes  15    METs  3      Elliptical   Level  1    Speed  3    Minutes  15      REL-XR   Level  3    Speed  50    Minutes  15  METs  3      T5 Nustep   Level  2    SPM  80    Minutes  15    METs  3      Biostep-RELP   Level  3    SPM  50    Minutes  15    METs  3      Prescription Details   Frequency (times per week)  3    Duration  Progress to 30 minutes of continuous aerobic without signs/symptoms of  physical distress      Intensity   THRR 40-80% of Max Heartrate  94-122    Ratings of Perceived Exertion  11-15    Perceived Dyspnea  0-4      Resistance Training   Training Prescription  Yes    Weight  4 lb    Reps  10-15       Perform Capillary Blood Glucose checks as needed.  Exercise Prescription Changes: Exercise Prescription Changes    Row Name 04/02/19 1500 04/10/19 1100 04/23/19 1300 05/10/19 0900 06/18/19 1200     Response to Exercise   Blood Pressure (Admit)  134/68  130/70  142/70  110/60  126/70   Blood Pressure (Exercise)  148/64  140/82  158/62  122/66  158/82   Blood Pressure (Exit)  138/74  140/80  100/60  108/48  102/60   Heart Rate (Admit)  65 bpm  66 bpm  63 bpm  64 bpm  55 bpm   Heart Rate (Exercise)  107 bpm  90 bpm  114 bpm  77 bpm  103 bpm   Heart Rate (Exit)  76 bpm  76 bpm  81 bpm  61 bpm  79 bpm   Oxygen Saturation (Admit)  95 %  --  --  --  --   Oxygen Saturation (Exercise)  97 %  --  --  --  --   Rating of Perceived Exertion (Exercise)  '12  15  15  15  17   '$ Perceived Dyspnea (Exercise)  1  --  --  --  --   Symptoms  none  none  none  none  noone   Comments  walk test  third full day of exercise  --  --  --   Duration  --  Continue with 30 min of aerobic exercise without signs/symptoms of physical distress.  Continue with 30 min of aerobic exercise without signs/symptoms of physical distress.  Continue with 30 min of aerobic exercise without signs/symptoms of physical distress.  Continue with 30 min of aerobic exercise without signs/symptoms of physical distress.   Intensity  --  THRR unchanged  THRR unchanged  THRR unchanged  THRR unchanged     Progression   Progression  --  Continue to progress workloads to maintain intensity without signs/symptoms of physical distress.  Continue to progress workloads to maintain intensity without signs/symptoms of physical distress.  Continue to progress workloads to maintain intensity without signs/symptoms of  physical distress.  Continue to progress workloads to maintain intensity without signs/symptoms of physical distress.   Average METs  --  3.63  3.5  3.08  3.07     Resistance Training   Training Prescription  --  Yes  Yes  Yes  Yes   Weight  --  4 lb   5 lb   5 lb   5lb   Reps  --  10-15  10-15  10-15  10-15  Interval Training   Interval Training  --  No  No  No  No     Treadmill   MPH  --  2.3  2.7  2.7  2.7   Grade  --  0  0  0  0   Minutes  --  '15  15  15  15   '$ METs  --  2.76  3.07  3.07  3.07     Recumbant Bike   Level  --  3  --  4  --   Watts  --  40  --  29  --   Minutes  --  15  --  15  --   METs  --  3.57  --  3.17  --     Elliptical   Level  --  --  '1  1  1   '$ Speed  --  --  '3  3  3   '$ Minutes  --  --  15 2-3 at a time the rest  15  15     T5 Nustep   Level  --  2  --  --  --   Minutes  --  15  --  --  --   METs  --  3.2  --  --  --     Biostep-RELP   Level  --  3  --  3  --   Minutes  --  15  --  15  --   METs  --  5  --  3  --     Home Exercise Plan   Plans to continue exercise at  --  --  --  Home (comment) walking, return to [facility] (comment) walking, return to [facility]   Frequency  --  --  --  Add 3 additional days to program exercise sessions.  Add 3 additional days to program exercise sessions.   Initial Home Exercises Provided  --  --  --  04/15/19  04/15/19   Row Name 07/03/19 1200 07/16/19 1500           Response to Exercise   Blood Pressure (Admit)  120/58  130/60      Blood Pressure (Exercise)  114/46  142/58      Blood Pressure (Exit)  112/60  94/60      Heart Rate (Admit)  66 bpm  79 bpm      Heart Rate (Exercise)  98 bpm  95 bpm      Heart Rate (Exit)  98 bpm  64 bpm      Rating of Perceived Exertion (Exercise)  15  17      Symptoms  none  none      Duration  Continue with 30 min of aerobic exercise without signs/symptoms of physical distress.  Continue with 30 min of aerobic exercise without signs/symptoms of physical distress.       Intensity  THRR unchanged  THRR unchanged        Progression   Progression  Continue to progress workloads to maintain intensity without signs/symptoms of physical distress.  Continue to progress workloads to maintain intensity without signs/symptoms of physical distress.      Average METs  3.1  3.07        Resistance Training   Training Prescription  Yes  Yes      Weight   5lb  5 lb      Reps  10-15  10-15  Interval Training   Interval Training  No  No        Treadmill   MPH  2.7  2.7      Grade  0  0      Minutes  15  15      METs  3.07  3.07        NuStep   Level  4  --      Minutes  15  --      METs  3.1  --        Elliptical   Level  --  1      Speed  --  3      Minutes  --  15        Home Exercise Plan   Plans to continue exercise at  Home (comment) walking, return to [facility]  --      Frequency  Add 3 additional days to program exercise sessions.  --      Initial Home Exercises Provided  04/15/19  --         Exercise Comments:   Exercise Goals and Review: Exercise Goals    Row Name 04/02/19 1510             Exercise Goals   Increase Physical Activity  Yes       Intervention  Provide advice, education, support and counseling about physical activity/exercise needs.;Develop an individualized exercise prescription for aerobic and resistive training based on initial evaluation findings, risk stratification, comorbidities and participant's personal goals.       Expected Outcomes  Short Term: Attend rehab on a regular basis to increase amount of physical activity.;Long Term: Add in home exercise to make exercise part of routine and to increase amount of physical activity.;Long Term: Exercising regularly at least 3-5 days a week.       Increase Strength and Stamina  Yes       Intervention  Provide advice, education, support and counseling about physical activity/exercise needs.;Develop an individualized exercise prescription for aerobic and resistive  training based on initial evaluation findings, risk stratification, comorbidities and participant's personal goals.       Expected Outcomes  Short Term: Increase workloads from initial exercise prescription for resistance, speed, and METs.;Short Term: Perform resistance training exercises routinely during rehab and add in resistance training at home;Long Term: Improve cardiorespiratory fitness, muscular endurance and strength as measured by increased METs and functional capacity (6MWT)       Able to understand and use rate of perceived exertion (RPE) scale  Yes       Intervention  Provide education and explanation on how to use RPE scale       Expected Outcomes  Short Term: Able to use RPE daily in rehab to express subjective intensity level;Long Term:  Able to use RPE to guide intensity level when exercising independently       Knowledge and understanding of Target Heart Rate Range (THRR)  Yes       Intervention  Provide education and explanation of THRR including how the numbers were predicted and where they are located for reference       Expected Outcomes  Short Term: Able to state/look up THRR;Short Term: Able to use daily as guideline for intensity in rehab;Long Term: Able to use THRR to govern intensity when exercising independently       Able to check pulse independently  Yes       Intervention  Provide education  and demonstration on how to check pulse in carotid and radial arteries.;Review the importance of being able to check your own pulse for safety during independent exercise       Expected Outcomes  Short Term: Able to explain why pulse checking is important during independent exercise;Long Term: Able to check pulse independently and accurately       Understanding of Exercise Prescription  Yes       Intervention  Provide education, explanation, and written materials on patient's individual exercise prescription       Expected Outcomes  Short Term: Able to explain program exercise  prescription;Long Term: Able to explain home exercise prescription to exercise independently          Exercise Goals Re-Evaluation : Exercise Goals Re-Evaluation    Row Name 04/03/19 1424 04/10/19 1131 04/15/19 1456 04/23/19 1318 05/10/19 0936     Exercise Goal Re-Evaluation   Exercise Goals Review  Increase Physical Activity;Able to understand and use rate of perceived exertion (RPE) scale;Knowledge and understanding of Target Heart Rate Range (THRR);Understanding of Exercise Prescription;Increase Strength and Stamina;Able to check pulse independently  Increase Physical Activity;Increase Strength and Stamina;Understanding of Exercise Prescription  Increase Physical Activity;Increase Strength and Stamina;Understanding of Exercise Prescription;Able to understand and use rate of perceived exertion (RPE) scale;Able to understand and use Dyspnea scale;Knowledge and understanding of Target Heart Rate Range (THRR);Able to check pulse independently  Increase Physical Activity;Increase Strength and Stamina;Able to understand and use rate of perceived exertion (RPE) scale;Knowledge and understanding of Target Heart Rate Range (THRR);Able to check pulse independently;Understanding of Exercise Prescription  Increase Physical Activity;Increase Strength and Stamina;Understanding of Exercise Prescription   Comments  Reviewed RPE scale, THR and program prescription with pt today.  Pt voiced understanding and was given a copy of goals to take home.  Patrick Wilson is off to a good start in rehab.  He is doing 2.3 mph on the treadmill as the 2.7 mph felt a little fast.  We will continue to montior his progress.  Patrick Wilson is doing well in rehab.  He has already started to do some stretching and walking at home. Reviewed home exercise with pt today.  Pt plans to walk and use staff videos for exercise.  Reviewed THR, pulse, RPE, sign and symptoms, and when to call 911 or MD.  Also discussed weather considerations and indoor options.  Pt  voiced understanding.  Patrick Wilson works in [employer] and RPE range.  He has increased to 5 lb weights.  Staff will monitor progress.  Patrick Wilson has been doing well in rehab.  He tried the ellipitcal just before Christmas and found it challenging.  He is also up to level 4 on the bike.  We will continue to monitor his progress.   Expected Outcomes  Short: Use RPE daily to regulate intensity. Long: Follow program prescription in THR.  Short: Continue to attend regularly.  Long: Continue to follow program prescritpion  Short: Continue to walk at home on off days.  Long: Continue to improve stamina.  Short - continue to exercise conistently Long improve overall stamina  Short: Continue to exercise on off days.  Long: Continue to improve stamina to return to [place].   [place] Name 05/20/19 1330 06/18/19 1228 06/21/19 1158 07/03/19 1207 07/08/19 1325     Exercise Goal Re-Evaluation   Exercise Goals Review  Increase Physical Activity;Increase Strength and Stamina;Understanding of Exercise Prescription  Increase Physical Activity;Increase Strength and Stamina;Able to understand and use rate of perceived exertion (RPE) scale;Knowledge and understanding  of Target Heart Rate Range (THRR);Able to check pulse independently;Understanding of Exercise Prescription;Able to understand and use Dyspnea scale  Increase Physical Activity;Increase Strength and Stamina;Understanding of Exercise Prescription  Increase Physical Activity;Increase Strength and Stamina;Understanding of Exercise Prescription  Increase Physical Activity;Increase Strength and Stamina;Understanding of Exercise Prescription   Comments  Worthy reported that he is exercising about every day. He is walking, using a stepper, using weights, and stretching. He stated that he is very committed to his cardiac rehab program and is anxious to get back to coming to [facility] 3 days a week. He stated his commitment to continue with home exercise until the program re-opens fully.  Patrick Wilson has been  trying the elliptical during classes.  It is challenging for him but he is willing to work on increasing time.  Patrick Wilson has been doing well with his exercise at home.  He is using his stepper and weights daily. He missed coming to class yesterday as it is also his social outlet.  Patrick Wilson is doing well in rehab.  He is up to 3.1 METs on the NuStep.  We will conitnue to montiro his progress.  Patrick Wilson is doing well in rehab.  He is up to 3.1 METs on the NuStep.  We will conitnue to montiro his progress. Pt reports staying active at home. He uses a stepper at home: will do it intermittently such as during commercials. He will also walk up and down the hallway and walking to mailbox. He is doing 15 reps with his weights now.   Expected Outcomes  Short: continue to do home exercise until cardiac rehab fully re-opens, and then come back to exercise in [place] 3 days per week. Long: continue to improve stamina.  Short : work up to 15 min on elliptical without stopping Long :  build stamina  Short: Continue to exercise on off days.  Long: Continue to build stamina.  Short: Add incline to treadmill.  Long: Continue to improve stamina.  Short: Add incline to treadmill.  Long: Continue to improve stamina.   [place] Name 07/16/19 1549             Exercise Goal Re-Evaluation   Exercise Goals Review  Increase Physical Activity;Increase Strength and Stamina;Able to understand and use rate of perceived exertion (RPE) scale;Able to understand and use Dyspnea scale;Knowledge and understanding of Target Heart Rate Range (THRR);Able to check pulse independently;Understanding of Exercise Prescription       Comments  Patrick Wilson has missed sessions due to some issues with BP.  He will return when his Dr clears him.       Expected Outcomes  Short: get BP under control Long: get back to rehab classes          Discharge Exercise Prescription (Final Exercise Prescription Changes): Exercise Prescription Changes - 07/16/19 1500      Response to  Exercise   Blood Pressure (Admit)  130/60    Blood Pressure (Exercise)  142/58    Blood Pressure (Exit)  94/60    Heart Rate (Admit)  79 bpm    Heart Rate (Exercise)  95 bpm    Heart Rate (Exit)  64 bpm    Rating of Perceived Exertion (Exercise)  17    Symptoms  none    Duration  Continue with 30 min of aerobic exercise without signs/symptoms of physical distress.    Intensity  THRR unchanged      Progression   Progression  Continue to progress workloads to maintain intensity without  signs/symptoms of physical distress.    Average METs  3.07      Resistance Training   Training Prescription  Yes    Weight  5 lb    Reps  10-15      Interval Training   Interval Training  No      Treadmill   MPH  2.7    Grade  0    Minutes  15    METs  3.07      Elliptical   Level  1    Speed  3    Minutes  15       Nutrition:  Target Goals: Understanding of nutrition guidelines, daily intake of sodium '1500mg'$ , cholesterol '200mg'$ , calories 30% from fat and 7% or less from saturated fats, daily to have 5 or more servings of fruits and vegetables.  Biometrics: Pre Biometrics - 04/02/19 1510      Pre Biometrics   Height  '5\' 8"'$  (1.727 m)    Weight  175 lb 3.2 oz (79.5 kg)    BMI (Calculated)  26.65    Single Leg Stand  10.31 seconds        Nutrition Therapy Plan and Nutrition Goals: Nutrition Therapy & Goals - 04/04/19 1003      Nutrition Therapy   Diet  Low Na, HH diet    Protein (specify units)  65g    Fiber  30 grams    Whole Grain Foods  3 servings    Saturated Fats  12 max. grams    Fruits and Vegetables  5 servings/day    Sodium  1.5 grams      Personal Nutrition Goals   Nutrition Goal  ST: reduce Na LT: UBW 175#, wants to get down to 165# - wants to gain strength    Comments  Pt reports not eating properly, but is trying. Pt wants to build skills. B: 2 cups coffee with danish or cereal like raisin bran with whole milk and banana. Drinks water during the day.  L:meatloaf, stewed apples with pintos or corn and okra etc. at [employer] (has this the 3 days coming to rehab) and frozen pizza (half) and boiled eggs w/ turnip greens and corned bread and meatloaf with baked beans/ peas and frozen vegetables with meat. Pt reports loving vegetables. S: small box of raisins most days, ritz crackers with jiff peanut butter with a glass of whole milk (1-2x/week). Pt reports loving his families homemade chocolate pie. Will sometimes have a can of tuna (bread and butter pickle juice) with some ritz crackers. Pt reports sodium is a problem for him. Pt also reports likes [place] stew. Discussed general [place] eating. Pt would like to also work on skill goals, pt reports mild ST memory issues, discussed giving him handouts to keep for review and providing him with youtube skills videos (reports having access to computer).      Intervention Plan   Intervention  Nutrition handout(s) given to patient.;Prescribe, educate and counsel regarding individualized specific dietary modifications aiming towards targeted core components such as weight, hypertension, lipid management, diabetes, heart failure and other comorbidities.    Expected Outcomes  Short Term Goal: Understand basic principles of dietary content, such as calories, fat, sodium, cholesterol and nutrients.;Short Term Goal: A plan has been developed with personal nutrition goals set during dietitian appointment.;Long Term Goal: Adherence to prescribed nutrition plan.       Nutrition Assessments:   Nutrition Goals Re-Evaluation: Nutrition Goals Re-Evaluation    Row  Name 06/25/19 0750 07/08/19 1333           Goals   Nutrition Goal  ST: reduce Na LT: UBW 175#, wants to get down to 165# - wants to gain strength  ST: rinse off beans (low sodium) LT: UBW 175#, wants to get down to 165# - wants to gain strength      Comment  Continue with current changes  ST: rinse off beans (low sodium). discussed lower sodium.       Expected Outcome  ST: reduce Na LT: UBW 175#, wants to get down to 165# - wants to gain strength  ST: reduce Na LT: UBW 175#, wants to get down to 165# - wants to gain strength         Nutrition Goals Discharge (Final Nutrition Goals Re-Evaluation): Nutrition Goals Re-Evaluation - 07/08/19 1333      Goals   Nutrition Goal  ST: rinse off beans (low sodium) LT: UBW 175#, wants to get down to 165# - wants to gain strength    Comment  ST: rinse off beans (low sodium). discussed lower sodium.    Expected Outcome  ST: reduce Na LT: UBW 175#, wants to get down to 165# - wants to gain strength       Psychosocial: Target Goals: Acknowledge presence or absence of significant depression and/or stress, maximize coping skills, provide positive support system. Participant is able to verbalize types and ability to use techniques and skills needed for reducing stress and depression.   Initial Review & Psychosocial Screening: Initial Psych Review & Screening - 04/01/19 1223      Initial Review   Current issues with  None Identified    Comments  Wife passed away this Sep 19, 2018 She had been sick 5-6 years and Wilder cared for her.      Family Dynamics   Good Support System?  Yes   Daughter lives above [place], Son in [place], has niece lives up the street  He talks to [employer] all daily. Neighbors keep eye on [employer].     Barriers   Psychosocial barriers to participate in program  There are no identifiable barriers or psychosocial needs.;The patient should benefit from training in stress management and relaxation.      Screening Interventions   Interventions  Encouraged to exercise;Provide feedback about the scores to participant;To provide support and resources with identified psychosocial needs    Expected Outcomes  Short Term goal: Utilizing psychosocial counselor, staff and physician to assist with identification of specific Stressors or current issues interfering with healing process. Setting desired goal  for each stressor or current issue identified.;Long Term Goal: Stressors or current issues are controlled or eliminated.;Short Term goal: Identification and review with participant of any Quality of Life or Depression concerns found by scoring the questionnaire.;Long Term goal: The participant improves quality of Life and PHQ9 Scores as seen by post scores and/or verbalization of changes       Quality of Life Scores:  Quality of Life - 04/02/19 1515      Quality of Life   Select  Quality of Life      Quality of Life Scores   Health/Function Pre  27.43 %    Socioeconomic Pre  30 %    Psych/Spiritual Pre  30 %    Family Pre  30 %    GLOBAL Pre  28.84 %      Scores of 19 and below usually indicate a poorer quality of life in these  areas.  A difference of  2-3 points is a clinically meaningful difference.  A difference of 2-3 points in the total score of the Quality of Life Index has been associated with significant improvement in overall quality of life, self-image, physical symptoms, and general health in studies assessing change in quality of life.  PHQ-9: Recent Review Flowsheet Data    Depression screen [facility] 2/9 04/02/2019   Decreased Interest 0   Down, Depressed, Hopeless 0   PHQ - 2 Score 0   Altered sleeping 0   Tired, decreased energy 2   Trouble concentrating 0   Moving slowly or fidgety/restless 0   Suicidal thoughts 0   PHQ-9 Score 2   Difficult doing work/chores Not difficult at all     Interpretation of Total Score  Total Score Depression Severity:  1-4 = Minimal depression, 5-9 = Mild depression, 10-14 = Moderate depression, 15-19 = Moderately severe depression, 20-27 = Severe depression   Psychosocial Evaluation and Intervention: Psychosocial Evaluation - 04/01/19 1238      Psychosocial Evaluation & Interventions   Interventions  Encouraged to exercise with the program and follow exercise prescription    Comments  Patrick Wilson has no barriers to attending the program.  He is excited to get started and get out of the house.  His second wife passed away in 09-14-22 this year. She had been sick and required his care for the past 5-6 years. He wants to lose some weight that he gained while he was not able to be active as he cared for his wife. He has a great support system, HIs children , a niece and his neighbors. He talks to them all daily. He gets around the neighborhood on his golfcart. He plans to attend the full program.    Expected Outcomes  STG: Chey attends sessions LTG: Patrick Wilson is able to utilize his lifestyle modifications he ahs learned during the program.    Continue Psychosocial Services   Follow up required by staff       Psychosocial Re-Evaluation: Psychosocial Re-Evaluation    [place] Name 04/15/19 1451 05/20/19 1333 06/21/19 1200         Psychosocial Re-Evaluation   Current issues with  Current Stress Concerns  Current Stress Concerns  Current Stress Concerns     Comments  Patrick Wilson is doing well in rehab. He is working on quitting chewing tobacco.  He really wants to build his energy back up to be able to get out to hike again. His son is coming to visit for Christmas week!!  He is looking forward to having him come visit.  He is taking safety protocols for his son. Overall he is feeling good.  Since he has started he has really enjoyed getting to see people and getting the exercise in regularly. He is impressed with how much his energy levels are starting to recover.  Patrick Wilson reports no new stress concerns and states that he has a strong support system.  He is feeling quite frustrated right now.  The cable/phone company has been giving him the run around.  He has been trying to get his TV and phone line working for almost three weeks.  They are now supposed to be coming on Monday afternoon.   He said he has spent almost 3 hours on the phone with them!  Exercise class is his social outlet and he misses it when he can't come.     Expected Outcomes  Short: Enjoy visit  with son.  Long: Continue to attend exercise regularly.  Short: Continue to rely on support system for mental health needs. Long: continue to exercise to help manage stress.  Short: Get phone lines fixed.  Long: Continue stay positive.     Interventions  Encouraged to attend Cardiac Rehabilitation for the exercise  --  Encouraged to attend Cardiac Rehabilitation for the exercise     Continue Psychosocial Services   Follow up required by staff  --  Follow up required by staff        Psychosocial Discharge (Final Psychosocial Re-Evaluation): Psychosocial Re-Evaluation - 06/21/19 1200      Psychosocial Re-Evaluation   Current issues with  Current Stress Concerns    Comments  He is feeling quite frustrated right now.  The cable/phone company has been giving him the run around.  He has been trying to get his TV and phone line working for almost three weeks.  They are now supposed to be coming on Monday afternoon.   He said he has spent almost 3 hours on the phone with them!  Exercise class is his social outlet and he misses it when he can't come.    Expected Outcomes  Short: Get phone lines fixed.  Long: Continue stay positive.    Interventions  Encouraged to attend Cardiac Rehabilitation for the exercise    Continue Psychosocial Services   Follow up required by staff       Vocational Rehabilitation: Provide vocational rehab assistance to qualifying candidates.   Vocational Rehab Evaluation & Intervention: Vocational Rehab - 04/01/19 1226      Initial Vocational Rehab Evaluation & Intervention   Assessment shows need for Vocational Rehabilitation  No       Education: Education Goals: Education classes will be provided on a variety of topics geared toward better understanding of heart health and risk factor modification. Participant will state understanding/return demonstration of topics presented as noted by education test scores.  Learning Barriers/Preferences: Learning  Barriers/Preferences - 04/01/19 1226      Learning Barriers/Preferences   Learning Barriers  None    Learning Preferences  None       Education Topics:  AED/CPR: - Group verbal and written instruction with the use of models to demonstrate the basic use of the AED with the basic ABC's of resuscitation.   General Nutrition Guidelines/Fats and Fiber: -Group instruction provided by verbal, written material, models and posters to present the general guidelines for heart healthy nutrition. Gives an explanation and review of dietary fats and fiber.   Controlling Sodium/Reading Food Labels: -Group verbal and written material supporting the discussion of sodium use in heart healthy nutrition. Review and explanation with models, verbal and written materials for utilization of the food label.   Exercise Physiology & General Exercise Guidelines: - Group verbal and written instruction with models to review the exercise physiology of the cardiovascular system and associated critical values. Provides general exercise guidelines with specific guidelines to those with heart or lung disease.    Aerobic Exercise & Resistance Training: - Gives group verbal and written instruction on the various components of exercise. Focuses on aerobic and resistive training programs and the benefits of this training and how to safely progress through these programs..   Flexibility, Balance, Mind/Body Relaxation: Provides group verbal/written instruction on the benefits of flexibility and balance training, including mind/body exercise modes such as yoga, pilates and tai chi.  Demonstration and skill practice provided.   Cardiac Rehab from 04/04/2019 in [facility] Cardiac and Pulmonary Rehab  Date  04/04/19  Educator  AS  Instruction Review Code  1- Verbalizes Understanding      Stress and Anxiety: - Provides group verbal and written instruction about the health risks of elevated stress and causes of high stress.   Discuss the correlation between heart/lung disease and anxiety and treatment options. Review healthy ways to manage with stress and anxiety.   Depression: - Provides group verbal and written instruction on the correlation between heart/lung disease and depressed mood, treatment options, and the stigmas associated with seeking treatment.   Anatomy & Physiology of the Heart: - Group verbal and written instruction and models provide basic cardiac anatomy and physiology, with the coronary electrical and arterial systems. Review of Valvular disease and Heart Failure   Cardiac Procedures: - Group verbal and written instruction to review commonly prescribed medications for heart disease. Reviews the medication, class of the drug, and side effects. Includes the steps to properly store meds and maintain the prescription regimen. (beta blockers and nitrates)   Cardiac Medications I: - Group verbal and written instruction to review commonly prescribed medications for heart disease. Reviews the medication, class of the drug, and side effects. Includes the steps to properly store meds and maintain the prescription regimen.   Cardiac Medications II: -Group verbal and written instruction to review commonly prescribed medications for heart disease. Reviews the medication, class of the drug, and side effects. (all other drug classes)    Go Sex-Intimacy & Heart Disease, Get SMART - Goal Setting: - Group verbal and written instruction through game format to discuss heart disease and the return to sexual intimacy. Provides group verbal and written material to discuss and apply goal setting through the application of the S.M.A.R.T. Method.   Other Matters of the Heart: - Provides group verbal, written materials and models to describe Stable Angina and Peripheral Artery. Includes description of the disease process and treatment options available to the cardiac patient.   Exercise & Equipment Safety: -  Individual verbal instruction and demonstration of equipment use and safety with use of the equipment.   Cardiac Rehab from 04/04/2019 in [facility] Cardiac and Pulmonary Rehab  Date  04/02/19  Educator  AS  Instruction Review Code  1- Verbalizes Understanding      Infection Prevention: - Provides verbal and written material to individual with discussion of infection control including proper hand washing and proper equipment cleaning during exercise session.   Cardiac Rehab from 04/04/2019 in [facility] Cardiac and Pulmonary Rehab  Date  04/02/19  Educator  AS  Instruction Review Code  1- Verbalizes Understanding      Falls Prevention: - Provides verbal and written material to individual with discussion of falls prevention and safety.   Cardiac Rehab from 04/04/2019 in [facility] Cardiac and Pulmonary Rehab  Date  04/02/19  Educator  AS  Instruction Review Code  1- Verbalizes Understanding      Diabetes: - Individual verbal and written instruction to review signs/symptoms of diabetes, desired ranges of glucose level fasting, after meals and with exercise. Acknowledge that pre and post exercise glucose checks will be done for 3 sessions at entry of program.   Know Your Numbers and Risk Factors: -Group verbal and written instruction about important numbers in your health.  Discussion of what are risk factors and how they play a role in the disease process.  Review of Cholesterol, Blood Pressure, Diabetes, and BMI and the role they play in your overall health.   Sleep Hygiene: -Provides group verbal and  written instruction about how sleep can affect your health.  Define sleep hygiene, discuss sleep cycles and impact of sleep habits. Review good sleep hygiene tips.    Other: -Provides group and verbal instruction on various topics (see comments)   Knowledge Questionnaire Score: Knowledge Questionnaire Score - 04/02/19 1514      Knowledge Questionnaire Score   Pre Score  19/26 nutrition        Core Components/Risk Factors/Patient Goals at Admission: Personal Goals and Risk Factors at Admission - 04/02/19 1511      Core Components/Risk Factors/Patient Goals on Admission    Weight Management  Weight Maintenance;Yes    Intervention  Weight Management: Develop a combined nutrition and exercise program designed to reach desired caloric intake, while maintaining appropriate intake of nutrient and fiber, sodium and fats, and appropriate energy expenditure required for the weight goal.;Weight Management: Provide education and appropriate resources to help participant work on and attain dietary goals.    Admit Weight  175 lb 3.2 oz (79.5 kg)    Expected Outcomes  Short Term: Continue to assess and modify interventions until short term weight is achieved;Long Term: Adherence to nutrition and physical activity/exercise program aimed toward attainment of established weight goal;Weight Maintenance: Understanding of the daily nutrition guidelines, which includes 25-35% calories from fat, 7% or less cal from saturated fats, less than '200mg'$  cholesterol, less than 1.5gm of sodium, & 5 or more servings of fruits and vegetables daily    Intervention  Assist the participant in steps to quit. Provide individualized education and counseling about committing to Tobacco Cessation, relapse prevention, and pharmacological support that can be provided by physician.;Advice worker, assist with locating and accessing local/national Quit Smoking programs, and support quit date choice.    Expected Outcomes  Short Term: Will demonstrate readiness to quit, by selecting a quit date.;Short Term: Will quit all tobacco product use, adhering to prevention of relapse plan.;Long Term: Complete abstinence from all tobacco products for at least 12 months from quit date.       Core Components/Risk Factors/Patient Goals Review:  Goals and Risk Factor Review    Row Name 04/15/19 1447 05/20/19 1332 06/21/19 1202  07/08/19 1327       Core Components/Risk Factors/Patient Goals Review   Personal Goals Review  Weight Management/Obesity;Tobacco Cessation;Hypertension;Lipids  Weight Management/Obesity;Tobacco Cessation;Hypertension;Lipids  Weight Management/Obesity;Hypertension  Weight Management/Obesity;Hypertension    Review  Patrick Wilson is doing well in rehab.  He is already down to 165 lb at home!  He is pleased with his progress and wants to continue to build muscle and energy levels.  Blood pressures have been good and he does check them at home.  He notices when it runs high/low based on how he feels.  He is doing well on his medications.  He has cut back on chewing tobacco and is making strides towards quitting altogether.  He wants to go slowly but is mentally ready to quit.  Patrick Wilson reports that he is taking his meds a prescribed by his doctor. He is maintaining a steady weight. Does not smoke, but still uses chewing tobacco.  His pressures have been doing good and his weight has been fairly steady. Surprisingly he is not gaining weight since he makes an afternoon out of his exercise session by going to [employer] after class for late lunch/early dinner.  His pressures have been doing good and his weight has been fairly steady.    Expected Outcomes  Short: Continue to work toward tobacco cessation.  Long: Continue to build stamina.  Short: Continue to work toward tobacco cessation. Long: Continue to build stamina.  Short: Continue to monitor weight with his diet.  Long; Continue to montior risk factors.  Short: Continue to monitor weight with his diet.  Long; Continue to montior risk factors.       Core Components/Risk Factors/Patient Goals at Discharge (Final Review):  Goals and Risk Factor Review - 07/08/19 1327      Core Components/Risk Factors/Patient Goals Review   Personal Goals Review  Weight Management/Obesity;Hypertension    Review  His pressures have been doing good and his weight has been fairly  steady.    Expected Outcomes  Short: Continue to monitor weight with his diet.  Long; Continue to montior risk factors.       ITP Comments: ITP Comments    Row Name 04/01/19 1256 04/02/19 1521 04/03/19 1423 04/04/19 1033 05/01/19 0844   ITP Comments  Virtual Orientation call completed. Has appointment tomorrow with Ep for Eval and Gym Orientation. Diagnosis documentation can be found in [facility] 10/17/2018.  6MWT completed and initial ITP created and sent to Dr Patrick Heck.  First full day of exercise!  Patient was oriented to gym and equipment including functions, settings, policies, and procedures.  Patient's individual exercise prescription and treatment plan were reviewed.  All starting workloads were established based on the results of the 6 minute walk test done at initial orientation visit.  The plan for exercise progression was also introduced and progression will be customized based on patient's performance and goals  Completed Initial RD eval  30 day review competed . ITP sent to Dr Emily Filbert for review, changes as needed and ITP approval signature   [place] Name 05/06/19 1348 05/20/19 1334 05/22/19 1422 05/27/19 1424 05/29/19 1212   ITP Comments  Checked in with patient since he is not in person this week. Stated he was doing really well. He is walking and doing exercises with weights. He reports that he is very thankful for all we have done and wants to keep working hard to feel better.  Virtual F/U call today:Patrick Wilson reported that he is exercising about every day. He is walking, using a stepper, using weights, and stretching. He stated that he is very committed to his cardiac rehab program and is anxious to get back to coming to [facility] 3 days a week. He stated his commitment to continue with home exercise until the program re-opens fully. Patrick Wilson reports that he is taking his meds a prescribed by his doctor. He is maintaining a steady weight. Does not smoke, but still uses chewing tobacco. Patrick Wilson reports no new  stress concerns and states that he has a strong support system.  Patrick Wilson has not attended in person since last review.  Cardiac Rehab is operating at minimal capacity, RD unable to get nutrition re-eval this 30 day cycle.  30 day review completed. ITP sent to Dr. Emily Filbert, Medical Director of Cardiac and Pulmonary Rehab. Continue with ITP unless changes are made by physician.  Department operating under reduced schedule until further notice by request from hospital leadership.   [place] Name 06/03/19 1318 06/21/19 1157 06/26/19 0630 07/24/19 0621     ITP Comments  Virtual call completed today. Calls are done every week that patient is not attending an onsite exercise session during the COVID 19 PHE Crisis. Today's call included review of : their home exercise, their personal goals, a psychosocial review.Patrick Wilson reports exercising very day using a stepper and walking.  He reports using 5 lb weights for resistive work and has no symptoms or concerns.  Virtual follow up appt completed today for goal review.  Pt also noted that he will be out on Monday as the cable company is coming to fix his phone line that afternoon.  30 day chart review completed. ITP sent to Dr Zachery Dakins Medical Director, for review,changes as needed and signature.  30 day chart review completed. ITP sent to Dr Zachery Dakins Medical Director, for review,changes as needed and signature. Continue with ITP if no changes requested       Comments:

## 2019-07-24 NOTE — Progress Notes (Signed)
Daily Session Note  Patient Details  Name: Patrick Wilson MRN: [id number] Date of Birth: [birth year] Referring Provider:     Cardiac Rehab from 04/02/2019 in [facility] Cardiac and Pulmonary Rehab  Referring Provider  [name]      Encounter Date: 07/24/2019  Check In: Session Check In - 07/24/19 1258      Check-In   Supervising physician immediately available to respond to emergencies  See telemetry face sheet for immediately available ER MD    Location  [facility]-Cardiac & Pulmonary Rehab    Staff Present  Heath Lark, RN, BSN, CCRP;Meredith Sherryll Burger, RN BSN;Melissa Caiola RDN, LDN;Joseph Hood RCP,RRT,BSRT    Virtual Visit  No    Medication changes reported      No    Fall or balance concerns reported     No    Warm-up and Cool-down  Performed on first and last piece of equipment    Resistance Training Performed  Yes    VAD Patient?  No    PAD/SET Patient?  No      Pain Assessment   Currently in Pain?  No/denies          Social History   Tobacco Use  Smoking Status Never Smoker  Smokeless Tobacco Current User  . Types: Chew  Tobacco Comment   May be ready to quit.    Goals Met:  Independence with exercise equipment Exercise tolerated well No report of cardiac concerns or symptoms  Goals Unmet:  Not Applicable  Comments: Pt able to follow exercise prescription today without complaint.  Will continue to monitor for progression.    Dr. Emily Filbert is Medical Director for [place] and [facility].

## 2019-07-25 ENCOUNTER — Other Ambulatory Visit: Payer: Self-pay

## 2019-07-25 ENCOUNTER — Encounter: Payer: Medicare HMO | Admitting: *Deleted

## 2019-07-25 DIAGNOSIS — K219 Gastro-esophageal reflux disease without esophagitis: Secondary | ICD-10-CM | POA: Diagnosis not present

## 2019-07-25 DIAGNOSIS — Z7982 Long term (current) use of aspirin: Secondary | ICD-10-CM | POA: Diagnosis not present

## 2019-07-25 DIAGNOSIS — I213 ST elevation (STEMI) myocardial infarction of unspecified site: Secondary | ICD-10-CM | POA: Diagnosis not present

## 2019-07-25 DIAGNOSIS — I252 Old myocardial infarction: Secondary | ICD-10-CM | POA: Diagnosis not present

## 2019-07-25 DIAGNOSIS — F329 Major depressive disorder, single episode, unspecified: Secondary | ICD-10-CM | POA: Diagnosis not present

## 2019-07-25 DIAGNOSIS — I2510 Atherosclerotic heart disease of native coronary artery without angina pectoris: Secondary | ICD-10-CM | POA: Diagnosis not present

## 2019-07-25 DIAGNOSIS — Z79899 Other long term (current) drug therapy: Secondary | ICD-10-CM | POA: Diagnosis not present

## 2019-07-25 DIAGNOSIS — Z85828 Personal history of other malignant neoplasm of skin: Secondary | ICD-10-CM | POA: Diagnosis not present

## 2019-07-25 DIAGNOSIS — I10 Essential (primary) hypertension: Secondary | ICD-10-CM | POA: Diagnosis not present

## 2019-07-25 NOTE — Progress Notes (Signed)
Daily Session Note  Patient Details  Name: Patrick Wilson MRN: [id number] Date of Birth: Jul 21, 1933 Referring Provider:     Cardiac Rehab from 04/02/2019 in [facility] Cardiac and Pulmonary Rehab  Referring Provider  [name]      Encounter Date: 07/25/2019  Check In: Session Check In - 07/25/19 1314      Check-In   Supervising physician immediately available to respond to emergencies  See telemetry face sheet for immediately available ER MD    Location  [facility]-Cardiac & Pulmonary Rehab    Staff Present  Heath Lark, RN, BSN, CCRP;Jessica [place], MA, RCEP, CCRP, CCET;Joseph Amity RCP,RRT,BSRT    Virtual Visit  No    Medication changes reported      No    Fall or balance concerns reported     No    Warm-up and Cool-down  Performed on first and last piece of equipment    Resistance Training Performed  Yes    VAD Patient?  No    PAD/SET Patient?  No      Pain Assessment   Currently in Pain?  No/denies          Social History   Tobacco Use  Smoking Status Never Smoker  Smokeless Tobacco Current User  . Types: Chew  Tobacco Comment   May be ready to quit.    Goals Met:  Independence with exercise equipment Exercise tolerated well No report of cardiac concerns or symptoms  Goals Unmet:  Not Applicable  Comments: Pt able to follow exercise prescription today without complaint.  Will continue to monitor for progression.    Dr. Emily Filbert is Medical Director for [place] and [facility].

## 2019-07-29 ENCOUNTER — Encounter: Payer: Medicare HMO | Admitting: *Deleted

## 2019-07-29 ENCOUNTER — Other Ambulatory Visit: Payer: Self-pay

## 2019-07-29 DIAGNOSIS — I213 ST elevation (STEMI) myocardial infarction of unspecified site: Secondary | ICD-10-CM | POA: Diagnosis not present

## 2019-07-29 DIAGNOSIS — Z7982 Long term (current) use of aspirin: Secondary | ICD-10-CM | POA: Diagnosis not present

## 2019-07-29 DIAGNOSIS — I10 Essential (primary) hypertension: Secondary | ICD-10-CM | POA: Diagnosis not present

## 2019-07-29 DIAGNOSIS — Z79899 Other long term (current) drug therapy: Secondary | ICD-10-CM | POA: Diagnosis not present

## 2019-07-29 DIAGNOSIS — F329 Major depressive disorder, single episode, unspecified: Secondary | ICD-10-CM | POA: Diagnosis not present

## 2019-07-29 DIAGNOSIS — K219 Gastro-esophageal reflux disease without esophagitis: Secondary | ICD-10-CM | POA: Diagnosis not present

## 2019-07-29 DIAGNOSIS — I252 Old myocardial infarction: Secondary | ICD-10-CM | POA: Diagnosis not present

## 2019-07-29 DIAGNOSIS — I2510 Atherosclerotic heart disease of native coronary artery without angina pectoris: Secondary | ICD-10-CM | POA: Diagnosis not present

## 2019-07-29 DIAGNOSIS — Z85828 Personal history of other malignant neoplasm of skin: Secondary | ICD-10-CM | POA: Diagnosis not present

## 2019-07-29 NOTE — Progress Notes (Signed)
Daily Session Note  Patient Details  Name: Patrick Wilson MRN: [id number] Date of Birth: Jan 06, 1934 Referring Provider:     Cardiac Rehab from 04/02/2019 in [facility] Cardiac and Pulmonary Rehab  Referring Provider  [name]      Encounter Date: 07/29/2019  Check In: Session Check In - 07/29/19 1314      Check-In   Supervising physician immediately available to respond to emergencies  See telemetry face sheet for immediately available ER MD    Location  [facility]-Cardiac & Pulmonary Rehab    Staff Present  Heath Lark, RN, BSN, CCRP;Meredith Hurlock, RN BSN;Jessica [place], MA, RCEP, CCRP, CCET;Melissa [place] RDN, Wilhelmina Mcardle, BS, ACSM CEP, Exercise Physiologist    Virtual Visit  No    Medication changes reported      No    Fall or balance concerns reported     No    Warm-up and Cool-down  Performed on first and last piece of equipment    Resistance Training Performed  Yes    VAD Patient?  No    PAD/SET Patient?  No      Pain Assessment   Currently in Pain?  No/denies          Social History   Tobacco Use  Smoking Status Never Smoker  Smokeless Tobacco Current User  . Types: Chew  Tobacco Comment   May be ready to quit.    Goals Met:  Independence with exercise equipment Exercise tolerated well No report of cardiac concerns or symptoms  Goals Unmet:  Not Applicable  Comments: Pt able to follow exercise prescription today without complaint.  Will continue to monitor for progression.    Dr. Emily Filbert is Medical Director for [place] and [facility].

## 2019-07-31 ENCOUNTER — Other Ambulatory Visit: Payer: Self-pay

## 2019-07-31 ENCOUNTER — Encounter: Payer: Medicare HMO | Admitting: *Deleted

## 2019-07-31 DIAGNOSIS — I213 ST elevation (STEMI) myocardial infarction of unspecified site: Secondary | ICD-10-CM

## 2019-07-31 DIAGNOSIS — F329 Major depressive disorder, single episode, unspecified: Secondary | ICD-10-CM | POA: Diagnosis not present

## 2019-07-31 DIAGNOSIS — Z85828 Personal history of other malignant neoplasm of skin: Secondary | ICD-10-CM | POA: Diagnosis not present

## 2019-07-31 DIAGNOSIS — Z7982 Long term (current) use of aspirin: Secondary | ICD-10-CM | POA: Diagnosis not present

## 2019-07-31 DIAGNOSIS — K219 Gastro-esophageal reflux disease without esophagitis: Secondary | ICD-10-CM | POA: Diagnosis not present

## 2019-07-31 DIAGNOSIS — I2510 Atherosclerotic heart disease of native coronary artery without angina pectoris: Secondary | ICD-10-CM | POA: Diagnosis not present

## 2019-07-31 DIAGNOSIS — I252 Old myocardial infarction: Secondary | ICD-10-CM | POA: Diagnosis not present

## 2019-07-31 DIAGNOSIS — Z79899 Other long term (current) drug therapy: Secondary | ICD-10-CM | POA: Diagnosis not present

## 2019-07-31 DIAGNOSIS — I10 Essential (primary) hypertension: Secondary | ICD-10-CM | POA: Diagnosis not present

## 2019-07-31 NOTE — Progress Notes (Signed)
Daily Session Note  Patient Details  Name: ABE SCHOOLS MRN: [id number] Date of Birth: 06/21/1933 Referring Provider:     Cardiac Rehab from 04/02/2019 in [facility] Cardiac and Pulmonary Rehab  Referring Provider  [name]     Encounter Date: 07/31/2019  Check In: Session Check In - 07/31/19 1321      Check-In   Supervising physician immediately available to respond to emergencies  See telemetry face sheet for immediately available ER MD    Staff Present  Nyoka Cowden, RN, BSN, Ardeth Sportsman RDN, LDN;Joseph Tessie Fass RCP,RRT,BSRT    Virtual Visit  No    Medication changes reported      No    Fall or balance concerns reported     No    Warm-up and Cool-down  Performed on first and last piece of equipment    Resistance Training Performed  Yes    VAD Patient?  No    PAD/SET Patient?  No      Pain Assessment   Currently in Pain?  No/denies          Social History   Tobacco Use  Smoking Status Never Smoker  Smokeless Tobacco Current User  . Types: Chew  Tobacco Comment   May be ready to quit.    Goals Met:  Independence with exercise equipment Exercise tolerated well No report of cardiac concerns or symptoms  Goals Unmet:  Not Applicable  Comments:Pt able to follow exercise prescription today without complaint.  Will continue to monitor for progression.   Dr. Emily Filbert is Medical Director for [place] and [facility].

## 2019-08-01 ENCOUNTER — Encounter: Payer: Medicare HMO | Attending: Internal Medicine | Admitting: *Deleted

## 2019-08-01 ENCOUNTER — Other Ambulatory Visit: Payer: Self-pay

## 2019-08-01 DIAGNOSIS — I2510 Atherosclerotic heart disease of native coronary artery without angina pectoris: Secondary | ICD-10-CM | POA: Diagnosis not present

## 2019-08-01 DIAGNOSIS — Z85828 Personal history of other malignant neoplasm of skin: Secondary | ICD-10-CM | POA: Insufficient documentation

## 2019-08-01 DIAGNOSIS — I252 Old myocardial infarction: Secondary | ICD-10-CM | POA: Diagnosis not present

## 2019-08-01 DIAGNOSIS — Z791 Long term (current) use of non-steroidal anti-inflammatories (NSAID): Secondary | ICD-10-CM | POA: Diagnosis not present

## 2019-08-01 DIAGNOSIS — I10 Essential (primary) hypertension: Secondary | ICD-10-CM | POA: Diagnosis not present

## 2019-08-01 DIAGNOSIS — F329 Major depressive disorder, single episode, unspecified: Secondary | ICD-10-CM | POA: Diagnosis not present

## 2019-08-01 DIAGNOSIS — K219 Gastro-esophageal reflux disease without esophagitis: Secondary | ICD-10-CM | POA: Diagnosis not present

## 2019-08-01 DIAGNOSIS — Z7982 Long term (current) use of aspirin: Secondary | ICD-10-CM | POA: Insufficient documentation

## 2019-08-01 DIAGNOSIS — Z79899 Other long term (current) drug therapy: Secondary | ICD-10-CM | POA: Insufficient documentation

## 2019-08-01 DIAGNOSIS — I213 ST elevation (STEMI) myocardial infarction of unspecified site: Secondary | ICD-10-CM

## 2019-08-01 NOTE — Progress Notes (Signed)
Daily Session Note  Patient Details  Name: Patrick Wilson MRN: [id number] Date of Birth: [birth year] Referring Provider:     Cardiac Rehab from 04/02/2019 in [facility] Cardiac and Pulmonary Rehab  Referring Provider  [name]      Encounter Date: 08/01/2019  Check In: Session Check In - 08/01/19 1311      Check-In   Supervising physician immediately available to respond to emergencies  See telemetry face sheet for immediately available ER MD    Location  [facility]-Cardiac & Pulmonary Rehab    Staff Present  Justin Mend RCP,RRT,BSRT;Meredith Sherryll Burger, RN BSN;Jessica Luan [place], MA, RCEP, CCRP, CCET    Virtual Visit  No    Medication changes reported      No    Fall or balance concerns reported     No    Warm-up and Cool-down  Performed on first and last piece of equipment    Resistance Training Performed  Yes    VAD Patient?  No    PAD/SET Patient?  No      Pain Assessment   Currently in Pain?  No/denies          Social History   Tobacco Use  Smoking Status Never Smoker  Smokeless Tobacco Current User  . Types: Chew  Tobacco Comment   May be ready to quit.    Goals Met:  Independence with exercise equipment Exercise tolerated well No report of cardiac concerns or symptoms Strength training completed today  Goals Unmet:  Not Applicable  Comments: Pt able to follow exercise prescription today without complaint.  Will continue to monitor for progression.    Dr. Emily Filbert is Medical Director for [place] and [facility].

## 2019-08-05 ENCOUNTER — Encounter: Payer: Medicare HMO | Admitting: *Deleted

## 2019-08-05 ENCOUNTER — Other Ambulatory Visit: Payer: Self-pay

## 2019-08-05 DIAGNOSIS — F329 Major depressive disorder, single episode, unspecified: Secondary | ICD-10-CM | POA: Diagnosis not present

## 2019-08-05 DIAGNOSIS — Z7982 Long term (current) use of aspirin: Secondary | ICD-10-CM | POA: Diagnosis not present

## 2019-08-05 DIAGNOSIS — Z79899 Other long term (current) drug therapy: Secondary | ICD-10-CM | POA: Diagnosis not present

## 2019-08-05 DIAGNOSIS — I213 ST elevation (STEMI) myocardial infarction of unspecified site: Secondary | ICD-10-CM

## 2019-08-05 DIAGNOSIS — K219 Gastro-esophageal reflux disease without esophagitis: Secondary | ICD-10-CM | POA: Diagnosis not present

## 2019-08-05 DIAGNOSIS — I10 Essential (primary) hypertension: Secondary | ICD-10-CM | POA: Diagnosis not present

## 2019-08-05 DIAGNOSIS — I252 Old myocardial infarction: Secondary | ICD-10-CM | POA: Diagnosis not present

## 2019-08-05 DIAGNOSIS — I2510 Atherosclerotic heart disease of native coronary artery without angina pectoris: Secondary | ICD-10-CM | POA: Diagnosis not present

## 2019-08-05 DIAGNOSIS — Z85828 Personal history of other malignant neoplasm of skin: Secondary | ICD-10-CM | POA: Diagnosis not present

## 2019-08-05 NOTE — Progress Notes (Signed)
Daily Session Note  Patient Details  Name: Patrick Wilson MRN: [id number] Date of Birth: 06/21/1933 Referring Provider:     Cardiac Rehab from 04/02/2019 in [facility] Cardiac and Pulmonary Rehab  Referring Provider  [name]      Encounter Date: 08/05/2019  Check In: Session Check In - 08/05/19 1307      Check-In   Supervising physician immediately available to respond to emergencies  See telemetry face sheet for immediately available ER MD    Location  [facility]-Cardiac & Pulmonary Rehab    Staff Present  Heath Lark, RN, BSN, [place]aved[place] Norman, BS, ACSM CEP, Exercise Physiologist;Joseph Hood RCP,RRT,BSRT;Melissa [place] RDN, LDN    Virtual Visit  No    Medication changes reported      No    Fall or balance concerns reported     No    Warm-up and Cool-down  Performed on first and last piece of equipment    Resistance Training Performed  Yes    VAD Patient?  No    PAD/SET Patient?  No      Pain Assessment   Currently in Pain?  No/denies          Social History   Tobacco Use  Smoking Status Never Smoker  Smokeless Tobacco Current User  . Types: Chew  Tobacco Comment   May be ready to quit.    Goals Met:  Independence with exercise equipment Exercise tolerated well No report of cardiac concerns or symptoms  Goals Unmet:  Not Applicable  Comments: Pt able to follow exercise prescription today without complaint.  Will continue to monitor for progression.    Dr. Emily Filbert is Medical Director for [place] and [facility].

## 2019-08-07 ENCOUNTER — Other Ambulatory Visit: Payer: Self-pay

## 2019-08-07 ENCOUNTER — Encounter: Payer: Medicare HMO | Admitting: *Deleted

## 2019-08-07 DIAGNOSIS — I2510 Atherosclerotic heart disease of native coronary artery without angina pectoris: Secondary | ICD-10-CM | POA: Diagnosis not present

## 2019-08-07 DIAGNOSIS — I213 ST elevation (STEMI) myocardial infarction of unspecified site: Secondary | ICD-10-CM | POA: Diagnosis not present

## 2019-08-07 DIAGNOSIS — Z7982 Long term (current) use of aspirin: Secondary | ICD-10-CM | POA: Diagnosis not present

## 2019-08-07 DIAGNOSIS — I252 Old myocardial infarction: Secondary | ICD-10-CM | POA: Diagnosis not present

## 2019-08-07 DIAGNOSIS — Z85828 Personal history of other malignant neoplasm of skin: Secondary | ICD-10-CM | POA: Diagnosis not present

## 2019-08-07 DIAGNOSIS — I10 Essential (primary) hypertension: Secondary | ICD-10-CM | POA: Diagnosis not present

## 2019-08-07 DIAGNOSIS — K219 Gastro-esophageal reflux disease without esophagitis: Secondary | ICD-10-CM | POA: Diagnosis not present

## 2019-08-07 DIAGNOSIS — Z79899 Other long term (current) drug therapy: Secondary | ICD-10-CM | POA: Diagnosis not present

## 2019-08-07 DIAGNOSIS — F329 Major depressive disorder, single episode, unspecified: Secondary | ICD-10-CM | POA: Diagnosis not present

## 2019-08-07 NOTE — Progress Notes (Signed)
Daily Session Note  Patient Details  Name: Patrick Wilson MRN: [id number] Date of Birth: 1934/03/09 Referring Provider:     Cardiac Rehab from 04/02/2019 in [facility] Cardiac and Pulmonary Rehab  Referring Provider  [name]      Encounter Date: 08/07/2019  Check In: Session Check In - 08/07/19 1306      Check-In   Supervising physician immediately available to respond to emergencies  See telemetry face sheet for immediately available ER MD    Location  [facility]-Cardiac & Pulmonary Rehab    Staff Present  Renita Papa, RN BSN;Joseph Hood RCP,RRT,BSRT;Melissa [place] RDN, Rowe Pavy, BA, ACSM CEP, Exercise Physiologist    Virtual Visit  No    Medication changes reported      No    Warm-up and Cool-down  Performed on first and last piece of equipment    Resistance Training Performed  Yes    VAD Patient?  No    PAD/SET Patient?  No      Pain Assessment   Currently in Pain?  No/denies          Social History   Tobacco Use  Smoking Status Never Smoker  Smokeless Tobacco Current User  . Types: Chew  Tobacco Comment   May be ready to quit.    Goals Met:  Independence with exercise equipment Exercise tolerated well No report of cardiac concerns or symptoms Strength training completed today  Goals Unmet:  Not Applicable  Comments: Pt able to follow exercise prescription today without complaint.  Will continue to monitor for progression.    Dr. Emily Filbert is Medical Director for [place] and [facility].

## 2019-08-08 ENCOUNTER — Encounter: Payer: Medicare HMO | Admitting: *Deleted

## 2019-08-08 ENCOUNTER — Other Ambulatory Visit: Payer: Self-pay

## 2019-08-08 DIAGNOSIS — I213 ST elevation (STEMI) myocardial infarction of unspecified site: Secondary | ICD-10-CM | POA: Diagnosis not present

## 2019-08-08 DIAGNOSIS — I252 Old myocardial infarction: Secondary | ICD-10-CM | POA: Diagnosis not present

## 2019-08-08 DIAGNOSIS — K219 Gastro-esophageal reflux disease without esophagitis: Secondary | ICD-10-CM | POA: Diagnosis not present

## 2019-08-08 DIAGNOSIS — Z79899 Other long term (current) drug therapy: Secondary | ICD-10-CM | POA: Diagnosis not present

## 2019-08-08 DIAGNOSIS — Z85828 Personal history of other malignant neoplasm of skin: Secondary | ICD-10-CM | POA: Diagnosis not present

## 2019-08-08 DIAGNOSIS — F329 Major depressive disorder, single episode, unspecified: Secondary | ICD-10-CM | POA: Diagnosis not present

## 2019-08-08 DIAGNOSIS — I2510 Atherosclerotic heart disease of native coronary artery without angina pectoris: Secondary | ICD-10-CM | POA: Diagnosis not present

## 2019-08-08 DIAGNOSIS — I10 Essential (primary) hypertension: Secondary | ICD-10-CM | POA: Diagnosis not present

## 2019-08-08 DIAGNOSIS — Z7982 Long term (current) use of aspirin: Secondary | ICD-10-CM | POA: Diagnosis not present

## 2019-08-08 NOTE — Progress Notes (Signed)
Daily Session Note  Patient Details  Name: Patrick Wilson MRN: [id number] Date of Birth: 1934-04-28 Referring Provider:     Cardiac Rehab from 04/02/2019 in [facility] Cardiac and Pulmonary Rehab  Referring Provider  [name]      Encounter Date: 08/08/2019  Check In: Session Check In - 08/08/19 1316      Check-In   Supervising physician immediately available to respond to emergencies  See telemetry face sheet for immediately available ER MD    Location  [facility]-Cardiac & Pulmonary Rehab    Staff Present  Nyoka Cowden, RN, BSN, MA;Meredith Sherryll Burger, RN BSN;Joseph Hood RCP,RRT,BSRT    Virtual Visit  No    Medication changes reported      No    Fall or balance concerns reported     No    Warm-up and Cool-down  Performed on first and last piece of equipment    VAD Patient?  No      Pain Assessment   Currently in Pain?  No/denies          Social History   Tobacco Use  Smoking Status Never Smoker  Smokeless Tobacco Current User  . Types: Chew  Tobacco Comment   May be ready to quit.    Goals Met:  Independence with exercise equipment Exercise tolerated well No report of cardiac concerns or symptoms  Goals Unmet:  Not Applicable  Comments: *Pt able to follow exercise prescription today without complaint.  Will continue to monitor for progression.   Dr. Emily Filbert is Medical Director for [place] and [facility].

## 2019-08-12 ENCOUNTER — Encounter: Payer: Medicare HMO | Admitting: *Deleted

## 2019-08-12 ENCOUNTER — Other Ambulatory Visit: Payer: Self-pay

## 2019-08-12 DIAGNOSIS — Z7982 Long term (current) use of aspirin: Secondary | ICD-10-CM | POA: Diagnosis not present

## 2019-08-12 DIAGNOSIS — I252 Old myocardial infarction: Secondary | ICD-10-CM | POA: Diagnosis not present

## 2019-08-12 DIAGNOSIS — I2510 Atherosclerotic heart disease of native coronary artery without angina pectoris: Secondary | ICD-10-CM | POA: Diagnosis not present

## 2019-08-12 DIAGNOSIS — F329 Major depressive disorder, single episode, unspecified: Secondary | ICD-10-CM | POA: Diagnosis not present

## 2019-08-12 DIAGNOSIS — I213 ST elevation (STEMI) myocardial infarction of unspecified site: Secondary | ICD-10-CM | POA: Diagnosis not present

## 2019-08-12 DIAGNOSIS — K219 Gastro-esophageal reflux disease without esophagitis: Secondary | ICD-10-CM | POA: Diagnosis not present

## 2019-08-12 DIAGNOSIS — Z85828 Personal history of other malignant neoplasm of skin: Secondary | ICD-10-CM | POA: Diagnosis not present

## 2019-08-12 DIAGNOSIS — Z79899 Other long term (current) drug therapy: Secondary | ICD-10-CM | POA: Diagnosis not present

## 2019-08-12 DIAGNOSIS — I10 Essential (primary) hypertension: Secondary | ICD-10-CM | POA: Diagnosis not present

## 2019-08-12 NOTE — Progress Notes (Signed)
Daily Session Note  Patient Details  Name: Patrick Wilson MRN: [id number] Date of Birth: 09-Feb-1934 Referring Provider:     Cardiac Rehab from 04/02/2019 in [facility] Cardiac and Pulmonary Rehab  Referring Provider  [name]      Encounter Date: 08/12/2019  Check In: Session Check In - 08/12/19 1318      Check-In   Supervising physician immediately available to respond to emergencies  See telemetry face sheet for immediately available ER MD    Location  [facility]-Cardiac & Pulmonary Rehab    Staff Present  Heath Lark, RN, BSN, CCRP;Melissa [place] RDN, LDN;Meredith Sherryll Burger, RN BSN;Jessica [place], MA, RCEP, CCRP, CCET;Joseph [place], [place], ACSM CEP, Exercise Physiologist    Virtual Visit  No    Medication changes reported      No    Fall or balance concerns reported     No    Warm-up and Cool-down  Performed on first and last piece of equipment    Resistance Training Performed  Yes    VAD Patient?  No    PAD/SET Patient?  No      Pain Assessment   Currently in Pain?  No/denies          Social History   Tobacco Use  Smoking Status Never Smoker  Smokeless Tobacco Current User  . Types: Chew  Tobacco Comment   May be ready to quit.    Goals Met:  Independence with exercise equipment Exercise tolerated well No report of cardiac concerns or symptoms  Goals Unmet:  Not Applicable  Comments: Pt able to follow exercise prescription today without complaint.  Will continue to monitor for progression.    Dr. Emily Filbert is Medical Director for [place] and [facility].

## 2019-08-14 ENCOUNTER — Encounter: Payer: Medicare HMO | Admitting: *Deleted

## 2019-08-14 ENCOUNTER — Other Ambulatory Visit: Payer: Self-pay

## 2019-08-14 DIAGNOSIS — K219 Gastro-esophageal reflux disease without esophagitis: Secondary | ICD-10-CM | POA: Diagnosis not present

## 2019-08-14 DIAGNOSIS — Z79899 Other long term (current) drug therapy: Secondary | ICD-10-CM | POA: Diagnosis not present

## 2019-08-14 DIAGNOSIS — I213 ST elevation (STEMI) myocardial infarction of unspecified site: Secondary | ICD-10-CM

## 2019-08-14 DIAGNOSIS — F329 Major depressive disorder, single episode, unspecified: Secondary | ICD-10-CM | POA: Diagnosis not present

## 2019-08-14 DIAGNOSIS — I2510 Atherosclerotic heart disease of native coronary artery without angina pectoris: Secondary | ICD-10-CM | POA: Diagnosis not present

## 2019-08-14 DIAGNOSIS — Z7982 Long term (current) use of aspirin: Secondary | ICD-10-CM | POA: Diagnosis not present

## 2019-08-14 DIAGNOSIS — Z85828 Personal history of other malignant neoplasm of skin: Secondary | ICD-10-CM | POA: Diagnosis not present

## 2019-08-14 DIAGNOSIS — I10 Essential (primary) hypertension: Secondary | ICD-10-CM | POA: Diagnosis not present

## 2019-08-14 DIAGNOSIS — I252 Old myocardial infarction: Secondary | ICD-10-CM | POA: Diagnosis not present

## 2019-08-14 NOTE — Progress Notes (Signed)
Daily Session Note  Patient Details  Name: Patrick Wilson MRN: [id number] Date of Birth: November 06, 1933 Referring Provider:     Cardiac Rehab from 04/02/2019 in [facility] Cardiac and Pulmonary Rehab  Referring Provider  [name]      Encounter Date: 08/14/2019  Check In: Session Check In - 08/14/19 1322      Check-In   Supervising physician immediately available to respond to emergencies  See telemetry face sheet for immediately available ER MD    Location  [facility]-Cardiac & Pulmonary Rehab    Staff Present  Renita Papa, RN BSN;Zacharias [place], RN, BSN, CCRP;Melissa [place] RDN, LDN;Joseph [place]    Virtual Visit  No    Medication changes reported      No    Fall or balance concerns reported     No    Warm-up and Cool-down  Performed on first and last piece of equipment    Resistance Training Performed  Yes    VAD Patient?  No    PAD/SET Patient?  No      Pain Assessment   Currently in Pain?  No/denies          Social History   Tobacco Use  Smoking Status Never Smoker  Smokeless Tobacco Current User  . Types: Chew  Tobacco Comment   May be ready to quit.    Goals Met:  Independence with exercise equipment Exercise tolerated well No report of cardiac concerns or symptoms  Goals Unmet:  Not Applicable  Comments: Pt able to follow exercise prescription today without complaint.  Will continue to monitor for progression.    Dr. Emily Filbert is Medical Director for [place] and [facility].

## 2019-08-15 ENCOUNTER — Other Ambulatory Visit: Payer: Self-pay

## 2019-08-15 ENCOUNTER — Encounter: Payer: Medicare HMO | Admitting: *Deleted

## 2019-08-15 DIAGNOSIS — I213 ST elevation (STEMI) myocardial infarction of unspecified site: Secondary | ICD-10-CM | POA: Diagnosis not present

## 2019-08-15 DIAGNOSIS — I252 Old myocardial infarction: Secondary | ICD-10-CM | POA: Diagnosis not present

## 2019-08-15 DIAGNOSIS — Z85828 Personal history of other malignant neoplasm of skin: Secondary | ICD-10-CM | POA: Diagnosis not present

## 2019-08-15 DIAGNOSIS — I10 Essential (primary) hypertension: Secondary | ICD-10-CM | POA: Diagnosis not present

## 2019-08-15 DIAGNOSIS — F329 Major depressive disorder, single episode, unspecified: Secondary | ICD-10-CM | POA: Diagnosis not present

## 2019-08-15 DIAGNOSIS — Z79899 Other long term (current) drug therapy: Secondary | ICD-10-CM | POA: Diagnosis not present

## 2019-08-15 DIAGNOSIS — Z7982 Long term (current) use of aspirin: Secondary | ICD-10-CM | POA: Diagnosis not present

## 2019-08-15 DIAGNOSIS — I2510 Atherosclerotic heart disease of native coronary artery without angina pectoris: Secondary | ICD-10-CM | POA: Diagnosis not present

## 2019-08-15 DIAGNOSIS — K219 Gastro-esophageal reflux disease without esophagitis: Secondary | ICD-10-CM | POA: Diagnosis not present

## 2019-08-15 NOTE — Progress Notes (Signed)
Daily Session Note  Patient Details  Name: Patrick Wilson MRN: [id number] Date of Birth: 10/01/1933 Referring Provider:     Cardiac Rehab from 04/02/2019 in [facility] Cardiac and Pulmonary Rehab  Referring Provider  [name]      Encounter Date: 08/15/2019  Check In: Session Check In - 08/15/19 1317      Check-In   Supervising physician immediately available to respond to emergencies  See telemetry face sheet for immediately available ER MD    Location  [facility]-Cardiac & Pulmonary Rehab    Staff Present  Heath Lark, RN, BSN, CCRP;Meredith [place], RN BSN;Joseph Foy [place], [place], ACSM CEP, Exercise Physiologist;Jessica [place], [place], RCEP, CCRP, CCET    Virtual Visit  No    Medication changes reported      No    Fall or balance concerns reported     No    Warm-up and Cool-down  Performed on first and last piece of equipment    Resistance Training Performed  Yes    VAD Patient?  No    PAD/SET Patient?  No      Pain Assessment   Currently in Pain?  No/denies          Social History   Tobacco Use  Smoking Status Never Smoker  Smokeless Tobacco Current User  . Types: Chew  Tobacco Comment   May be ready to quit.    Goals Met:  Independence with exercise equipment Exercise tolerated well No report of cardiac concerns or symptoms  Goals Unmet:  Not Applicable  Comments: Pt able to follow exercise prescription today without complaint.  Will continue to monitor for progression.    Dr. Emily Filbert is Medical Director for [place] and [facility].

## 2019-08-19 ENCOUNTER — Other Ambulatory Visit: Payer: Self-pay

## 2019-08-19 ENCOUNTER — Encounter: Payer: Medicare HMO | Admitting: *Deleted

## 2019-08-19 DIAGNOSIS — I213 ST elevation (STEMI) myocardial infarction of unspecified site: Secondary | ICD-10-CM

## 2019-08-19 DIAGNOSIS — Z79899 Other long term (current) drug therapy: Secondary | ICD-10-CM | POA: Diagnosis not present

## 2019-08-19 DIAGNOSIS — Z85828 Personal history of other malignant neoplasm of skin: Secondary | ICD-10-CM | POA: Diagnosis not present

## 2019-08-19 DIAGNOSIS — Z7982 Long term (current) use of aspirin: Secondary | ICD-10-CM | POA: Diagnosis not present

## 2019-08-19 DIAGNOSIS — F329 Major depressive disorder, single episode, unspecified: Secondary | ICD-10-CM | POA: Diagnosis not present

## 2019-08-19 DIAGNOSIS — K219 Gastro-esophageal reflux disease without esophagitis: Secondary | ICD-10-CM | POA: Diagnosis not present

## 2019-08-19 DIAGNOSIS — I252 Old myocardial infarction: Secondary | ICD-10-CM | POA: Diagnosis not present

## 2019-08-19 DIAGNOSIS — I10 Essential (primary) hypertension: Secondary | ICD-10-CM | POA: Diagnosis not present

## 2019-08-19 DIAGNOSIS — I2510 Atherosclerotic heart disease of native coronary artery without angina pectoris: Secondary | ICD-10-CM | POA: Diagnosis not present

## 2019-08-19 NOTE — Progress Notes (Signed)
Daily Session Note  Patient Details  Name: SHAWNTA Wilson MRN: [id number] Date of Birth: December 08, 1933 Referring Provider:     Cardiac Rehab from 04/02/2019 in [facility] Cardiac and Pulmonary Rehab  Referring Provider  [name]      Encounter Date: 08/19/2019  Check In: Session Check In - 08/19/19 1317      Check-In   Supervising physician immediately available to respond to emergencies  See telemetry face sheet for immediately available ER MD    Location  [facility]-Cardiac & Pulmonary Rehab    Staff Present  Heath Lark, RN, BSN, CCRP;Melissa [place] RDN, LDN;Joseph [employer], [place], ACSM CEP, Exercise Physiologist    Virtual Visit  No    Medication changes reported      No    Fall or balance concerns reported     No    Warm-up and Cool-down  Performed on first and last piece of equipment    Resistance Training Performed  Yes    VAD Patient?  No    PAD/SET Patient?  No      Pain Assessment   Currently in Pain?  No/denies          Social History   Tobacco Use  Smoking Status Never Smoker  Smokeless Tobacco Current User  . Types: Chew  Tobacco Comment   May be ready to quit.    Goals Met:  Independence with exercise equipment Exercise tolerated well No report of cardiac concerns or symptoms  Goals Unmet:  Not Applicable  Comments: Pt able to follow exercise prescription today without complaint.  Will continue to monitor for progression.    Dr. Emily Filbert is Medical Director for [place] and [facility].

## 2019-08-21 ENCOUNTER — Encounter: Payer: Medicare HMO | Admitting: *Deleted

## 2019-08-21 ENCOUNTER — Encounter: Payer: Self-pay | Admitting: *Deleted

## 2019-08-21 ENCOUNTER — Other Ambulatory Visit: Payer: Self-pay

## 2019-08-21 VITALS — Ht 68.0 in | Wt 171.3 lb

## 2019-08-21 DIAGNOSIS — Z7982 Long term (current) use of aspirin: Secondary | ICD-10-CM | POA: Diagnosis not present

## 2019-08-21 DIAGNOSIS — Z79899 Other long term (current) drug therapy: Secondary | ICD-10-CM | POA: Diagnosis not present

## 2019-08-21 DIAGNOSIS — I10 Essential (primary) hypertension: Secondary | ICD-10-CM | POA: Diagnosis not present

## 2019-08-21 DIAGNOSIS — Z85828 Personal history of other malignant neoplasm of skin: Secondary | ICD-10-CM | POA: Diagnosis not present

## 2019-08-21 DIAGNOSIS — F329 Major depressive disorder, single episode, unspecified: Secondary | ICD-10-CM | POA: Diagnosis not present

## 2019-08-21 DIAGNOSIS — I252 Old myocardial infarction: Secondary | ICD-10-CM | POA: Diagnosis not present

## 2019-08-21 DIAGNOSIS — I213 ST elevation (STEMI) myocardial infarction of unspecified site: Secondary | ICD-10-CM

## 2019-08-21 DIAGNOSIS — I2510 Atherosclerotic heart disease of native coronary artery without angina pectoris: Secondary | ICD-10-CM | POA: Diagnosis not present

## 2019-08-21 DIAGNOSIS — K219 Gastro-esophageal reflux disease without esophagitis: Secondary | ICD-10-CM | POA: Diagnosis not present

## 2019-08-21 NOTE — Progress Notes (Signed)
Cardiac Individual Treatment Plan  Patient Details  Name: Patrick Wilson MRN: [id number] Date of Birth: [birth year] Referring Provider:     Cardiac Rehab from 04/02/2019 in [facility] Cardiac and Pulmonary Rehab  Referring Provider  Sabra Heck [Paraschos]      Initial Encounter Date:    Cardiac Rehab from 04/02/2019 in [facility] Cardiac and Pulmonary Rehab  Date  04/02/19      Visit Diagnosis: ST elevation myocardial infarction (STEMI), unspecified artery ([place])  Patient's Home Medications on Admission:  Current Outpatient Medications:  .  ALPRAZolam (XANAX) 0.25 MG tablet, Take 0.25 mg by mouth 2 (two) times a day., Disp: , Rfl:  .  aspirin EC 81 MG tablet, Take 81 mg by mouth daily., Disp: , Rfl:  .  atorvastatin (LIPITOR) 80 MG tablet, Take 1 tablet (80 mg total) by mouth at bedtime., Disp: 30 tablet, Rfl: 1 .  atorvastatin (LIPITOR) 80 MG tablet, Take by mouth., Disp: , Rfl:  .  B Complex Vitamins (VITAMIN B COMPLEX PO), Take 1 tablet by mouth daily. , Disp: , Rfl:  .  Denture Care Products (DENTURE ADHESIVE) CREA, Apply 1 application topically daily as needed. , Disp: , Rfl:  .  diphenhydrAMINE (EQ ALLERGY) 25 mg capsule, Take 25 mg by mouth every 6 (six) hours as needed for allergies., Disp: , Rfl:  .  docusate sodium (COLACE) 100 MG capsule, Take 1 capsule (100 mg total) by mouth 2 (two) times daily. (Patient not taking: Reported on 04/01/2019), Disp: 60 capsule, Rfl: 0 .  donepezil (ARICEPT) 10 MG tablet, Take by mouth., Disp: , Rfl:  .  lisinopril (ZESTRIL) 2.5 MG tablet, Take 1 tablet (2.5 mg total) by mouth daily., Disp: 30 tablet, Rfl: 1 .  magnesium gluconate (MAGONATE) 500 MG tablet, Take 500 mg by mouth daily., Disp: , Rfl:  .  meloxicam (MOBIC) 15 MG tablet, Take 15 mg by mouth at bedtime., Disp: , Rfl:  .  metoCLOPramide (REGLAN) 5 MG tablet, Take 5 mg by mouth 2 (two) times daily., Disp: , Rfl:  .  metoprolol tartrate (LOPRESSOR) 25 MG tablet, Take 0.5 tablets (12.5 mg total) by mouth  every 12 (twelve) hours., Disp: 30 tablet, Rfl: 1 .  Multiple Vitamins-Minerals (BL CENTURY SENIOR PO), Take 1 tablet by mouth daily., Disp: , Rfl:  .  nitroGLYCERIN (NITROSTAT) 0.4 MG SL tablet, Place 0.4 mg under the tongue every 5 (five) minutes as needed for chest pain. Reported on 11/13/2015, Disp: , Rfl:  .  olmesartan (BENICAR) 20 MG tablet, Take by mouth., Disp: , Rfl:  .  Omega-3 Fatty Acids (FISH OIL) 1000 MG CAPS, Take 1,000 mg by mouth daily., Disp: , Rfl:  .  omeprazole (PRILOSEC) 40 MG capsule, Take 40 mg by mouth 2 (two) times daily., Disp: , Rfl:  .  sucralfate (CARAFATE) 1 g tablet, Take 1 g by mouth 4 (four) times daily., Disp: , Rfl:  .  tamsulosin (FLOMAX) 0.4 MG CAPS capsule, Take 1 capsule (0.4 mg total) by mouth daily., Disp: 30 capsule, Rfl: 0 .  ticagrelor (BRILINTA) 90 MG TABS tablet, Take by mouth., Disp: , Rfl:  .  traZODone (DESYREL) 100 MG tablet, Take 100 mg by mouth at bedtime., Disp: , Rfl:  .  venlafaxine XR (EFFEXOR-XR) 37.5 MG 24 hr capsule, Take 37.5 mg by mouth daily., Disp: , Rfl: 3  Past Medical History: Past Medical History:  Diagnosis Date  . Cancer ([place])    SKIN  . Coronary artery disease   .  Depression   . GERD (gastroesophageal reflux disease)   . History of kidney stones   . Hypertension   . Myocardial infarction ([place])    2012    Tobacco Use: Social History   Tobacco Use  Smoking Status Never Smoker  Smokeless Tobacco Current User  . Types: Chew  Tobacco Comment   May be ready to quit.    Labs: Recent Review Flowsheet Data    Labs for ITP Cardiac and Pulmonary Rehab Latest Ref Rng & Units 10/17/2018 10/18/2018   Cholestrol 0 - 200 mg/dL 138 -   LDLCALC 0 - 99 mg/dL 54 -   HDL >40 mg/dL 64 -   Trlycerides <150 mg/dL 101 -   Hemoglobin A1c 4.8 - 5.6 % - 5.5       Exercise Target Goals: Exercise Program Goal: Individual exercise prescription set using results from initial 6 min walk test and THRR while considering  patient's  activity barriers and safety.   Exercise Prescription Goal: Initial exercise prescription builds to 30-45 minutes a day of aerobic activity, 2-3 days per week.  Home exercise guidelines will be given to patient during program as part of exercise prescription that the participant will acknowledge.   Education: Aerobic Exercise & Resistance Training: - Gives group verbal and written instruction on the various components of exercise. Focuses on aerobic and resistive training programs and the benefits of this training and how to safely progress through these programs..   Education: Exercise & Equipment Safety: - Individual verbal instruction and demonstration of equipment use and safety with use of the equipment.   Cardiac Rehab from 04/04/2019 in [facility] Cardiac and Pulmonary Rehab  Date  04/02/19  Educator  AS  Instruction Review Code  1- Verbalizes Understanding      Education: Exercise Physiology & General Exercise Guidelines: - Group verbal and written instruction with models to review the exercise physiology of the cardiovascular system and associated critical values. Provides general exercise guidelines with specific guidelines to those with heart or lung disease.    Education: Flexibility, Balance, Mind/Body Relaxation: Provides group verbal/written instruction on the benefits of flexibility and balance training, including mind/body exercise modes such as yoga, pilates and tai chi.  Demonstration and skill practice provided.   Cardiac Rehab from 04/04/2019 in [facility] Cardiac and Pulmonary Rehab  Date  04/04/19  Educator  AS  Instruction Review Code  1- Verbalizes Understanding      Activity Barriers & Risk Stratification: Activity Barriers & Cardiac Risk Stratification - 04/01/19 1222      Activity Barriers & Cardiac Risk Stratification   Activity Barriers  None    Cardiac Risk Stratification  High       6 Minute Walk: 6 Minute Walk    Row Name 04/02/19 1458         6  Minute Walk   Phase  Initial     Distance  1600 feet     Walk Time  6 minutes     # of Rest Breaks  0     MPH  3.03     METS  2.98     RPE  12     Perceived Dyspnea   1     VO2 Peak  10.44     Symptoms  No     Resting HR  65 bpm     Resting BP  134/68     Resting Oxygen Saturation   97 %     Exercise Oxygen Saturation  during 6 min walk  95 %     Max Ex. HR  2.31 bpm     Max Ex. BP  148/64     2 Minute Post BP  138/74        Oxygen Initial Assessment:   Oxygen Re-Evaluation:   Oxygen Discharge (Final Oxygen Re-Evaluation):   Initial Exercise Prescription: Initial Exercise Prescription - 04/02/19 1500      Date of Initial Exercise RX and Referring Provider   Date  04/02/19    Referring Provider  [name]     Treadmill   MPH  2.7    Grade  0    Minutes  15    METs  3.07      Recumbant Bike   Level  3    RPM  60    Watts  25    Minutes  15    METs  3      Elliptical   Level  1    Speed  3    Minutes  15      REL-XR   Level  3    Speed  50    Minutes  15    METs  3      T5 Nustep   Level  2    SPM  80    Minutes  15    METs  3      Biostep-RELP   Level  3    SPM  50    Minutes  15    METs  3      Prescription Details   Frequency (times per week)  3    Duration  Progress to 30 minutes of continuous aerobic without signs/symptoms of physical distress      Intensity   THRR 40-80% of Max Heartrate  94-122    Ratings of Perceived Exertion  11-15    Perceived Dyspnea  0-4      Resistance Training   Training Prescription  Yes    Weight  4 lb    Reps  10-15       Perform Capillary Blood Glucose checks as needed.  Exercise Prescription Changes: Exercise Prescription Changes    Row Name 04/02/19 1500 04/10/19 1100 04/23/19 1300 05/10/19 0900 06/18/19 1200     Response to Exercise   Blood Pressure (Admit)  134/68  130/70  142/70  110/60  126/70   Blood Pressure (Exercise)  148/64  140/82  158/62  122/66  158/82   Blood  Pressure (Exit)  138/74  140/80  100/60  108/48  102/60   Heart Rate (Admit)  65 bpm  66 bpm  63 bpm  64 bpm  55 bpm   Heart Rate (Exercise)  107 bpm  90 bpm  114 bpm  77 bpm  103 bpm   Heart Rate (Exit)  76 bpm  76 bpm  81 bpm  61 bpm  79 bpm   Oxygen Saturation (Admit)  95 %  --  --  --  --   Oxygen Saturation (Exercise)  97 %  --  --  --  --   Rating of Perceived Exertion (Exercise)  '12  15  15  15  17   '$ Perceived Dyspnea (Exercise)  1  --  --  --  --   Symptoms  none  none  none  none  noone   Comments  walk test  third full day of exercise  --  --  --  Duration  --  Continue with 30 min of aerobic exercise without signs/symptoms of physical distress.  Continue with 30 min of aerobic exercise without signs/symptoms of physical distress.  Continue with 30 min of aerobic exercise without signs/symptoms of physical distress.  Continue with 30 min of aerobic exercise without signs/symptoms of physical distress.   Intensity  --  THRR unchanged  THRR unchanged  THRR unchanged  THRR unchanged     Progression   Progression  --  Continue to progress workloads to maintain intensity without signs/symptoms of physical distress.  Continue to progress workloads to maintain intensity without signs/symptoms of physical distress.  Continue to progress workloads to maintain intensity without signs/symptoms of physical distress.  Continue to progress workloads to maintain intensity without signs/symptoms of physical distress.   Average METs  --  3.63  3.5  3.08  3.07     Resistance Training   Training Prescription  --  Yes  Yes  Yes  Yes   Weight  --  4 lb   5 lb   5 lb   5lb   Reps  --  10-15  10-15  10-15  10-15     Interval Training   Interval Training  --  No  No  No  No     Treadmill   MPH  --  2.3  2.7  2.7  2.7   Grade  --  0  0  0  0   Minutes  --  '15  15  15  15   '$ METs  --  2.76  3.07  3.07  3.07     Recumbant Bike   Level  --  3  --  4  --   Watts  --  40  --  29  --   Minutes  --  15   --  15  --   METs  --  3.57  --  3.17  --     Elliptical   Level  --  --  '1  1  1   '$ Speed  --  --  '3  3  3   '$ Minutes  --  --  15 2-3 at a time the rest  15  15     T5 Nustep   Level  --  2  --  --  --   Minutes  --  15  --  --  --   METs  --  3.2  --  --  --     Biostep-RELP   Level  --  3  --  3  --   Minutes  --  15  --  15  --   METs  --  5  --  3  --     Home Exercise Plan   Plans to continue exercise at  --  --  --  Home (comment) walking, return to [facility] (comment) walking, return to [facility]   Frequency  --  --  --  Add 3 additional days to program exercise sessions.  Add 3 additional days to program exercise sessions.   Initial Home Exercises Provided  --  --  --  04/15/19  04/15/19   Row Name 07/03/19 1200 07/16/19 1500 07/29/19 1600 08/15/19 1200       Response to Exercise   Blood Pressure (Admit)  120/58  130/60  112/70  132/60    Blood Pressure (Exercise)  114/46  142/58  128/74  128/58  Blood Pressure (Exit)  112/60  94/60  110/62  130/70    Heart Rate (Admit)  66 bpm  79 bpm  69 bpm  54 bpm    Heart Rate (Exercise)  98 bpm  95 bpm  103 bpm  107 bpm    Heart Rate (Exit)  98 bpm  64 bpm  82 bpm  75 bpm    Rating of Perceived Exertion (Exercise)  '15  17  15  14    '$ Symptoms  none  none  none  none    Duration  Continue with 30 min of aerobic exercise without signs/symptoms of physical distress.  Continue with 30 min of aerobic exercise without signs/symptoms of physical distress.  Continue with 30 min of aerobic exercise without signs/symptoms of physical distress.  Continue with 30 min of aerobic exercise without signs/symptoms of physical distress.    Intensity  THRR unchanged  THRR unchanged  THRR unchanged  THRR unchanged      Progression   Progression  Continue to progress workloads to maintain intensity without signs/symptoms of physical distress.  Continue to progress workloads to maintain intensity without signs/symptoms of physical distress.   Continue to progress workloads to maintain intensity without signs/symptoms of physical distress.  Continue to progress workloads to maintain intensity without signs/symptoms of physical distress.    Average METs  3.1  3.07  2.94  3.9      Resistance Training   Training Prescription  Yes  Yes  Yes  Yes    Weight   5lb  5 lb  5 lb  5 lb    Reps  10-15  10-15  10-15  10-15      Interval Training   Interval Training  No  No  No  No      Treadmill   MPH  2.7  2.7  2.7  --    Grade  0  0  0  --    Minutes  '15  15  15  '$ --    METs  3.07  3.07  3.07  --      Recumbant Bike   Level  --  --  5  --    Watts  --  --  26  --    Minutes  --  --  15  --    METs  --  --  2.98  --      NuStep   Level  4  --  --  4    SPM  --  --  --  50    Minutes  15  --  --  15    METs  3.1  --  --  4.8      Arm Ergometer   Level  --  --  1  --    Minutes  --  --  15  --    METs  --  --  2  --      Elliptical   Level  --  1  --  --    Speed  --  3  --  --    Minutes  --  15  --  --      REL-XR   Level  --  --  5  --    Minutes  --  --  15  --    METs  --  --  3.7  --      Biostep-RELP   Level  --  --  --  3    SPM  --  --  --  50    Minutes  --  --  --  15    METs  --  --  --  3      Home Exercise Plan   Plans to continue exercise at  Home (comment) walking, return to [place] (comment) walking, return to [facility]  --    Frequency  Add 3 additional days to program exercise sessions.  --  Add 3 additional days to program exercise sessions.  --    Initial Home Exercises Provided  04/15/19  --  04/15/19  --       Exercise Comments:   Exercise Goals and Review: Exercise Goals    Row Name 04/02/19 1510             Exercise Goals   Increase Physical Activity  Yes       Intervention  Provide advice, education, support and counseling about physical activity/exercise needs.;Develop an individualized exercise prescription for aerobic and resistive training based on initial  evaluation findings, risk stratification, comorbidities and participant's personal goals.       Expected Outcomes  Short Term: Attend rehab on a regular basis to increase amount of physical activity.;Long Term: Add in home exercise to make exercise part of routine and to increase amount of physical activity.;Long Term: Exercising regularly at least 3-5 days a week.       Increase Strength and Stamina  Yes       Intervention  Provide advice, education, support and counseling about physical activity/exercise needs.;Develop an individualized exercise prescription for aerobic and resistive training based on initial evaluation findings, risk stratification, comorbidities and participant's personal goals.       Expected Outcomes  Short Term: Increase workloads from initial exercise prescription for resistance, speed, and METs.;Short Term: Perform resistance training exercises routinely during rehab and add in resistance training at home;Long Term: Improve cardiorespiratory fitness, muscular endurance and strength as measured by increased METs and functional capacity (6MWT)       Able to understand and use rate of perceived exertion (RPE) scale  Yes       Intervention  Provide education and explanation on how to use RPE scale       Expected Outcomes  Short Term: Able to use RPE daily in rehab to express subjective intensity level;Long Term:  Able to use RPE to guide intensity level when exercising independently       Knowledge and understanding of Target Heart Rate Range (THRR)  Yes       Intervention  Provide education and explanation of THRR including how the numbers were predicted and where they are located for reference       Expected Outcomes  Short Term: Able to state/look up THRR;Short Term: Able to use daily as guideline for intensity in rehab;Long Term: Able to use THRR to govern intensity when exercising independently       Able to check pulse independently  Yes       Intervention  Provide education  and demonstration on how to check pulse in carotid and radial arteries.;Review the importance of being able to check your own pulse for safety during independent exercise       Expected Outcomes  Short Term: Able to explain why pulse checking is important during independent exercise;Long Term: Able to check pulse independently and accurately       Understanding of Exercise Prescription  Yes  Intervention  Provide education, explanation, and written materials on patient's individual exercise prescription       Expected Outcomes  Short Term: Able to explain program exercise prescription;Long Term: Able to explain home exercise prescription to exercise independently          Exercise Goals Re-Evaluation : Exercise Goals Re-Evaluation    Row Name 04/03/19 1424 04/10/19 1131 04/15/19 1456 04/23/19 1318 05/10/19 0936     Exercise Goal Re-Evaluation   Exercise Goals Review  Increase Physical Activity;Able to understand and use rate of perceived exertion (RPE) scale;Knowledge and understanding of Target Heart Rate Range (THRR);Understanding of Exercise Prescription;Increase Strength and Stamina;Able to check pulse independently  Increase Physical Activity;Increase Strength and Stamina;Understanding of Exercise Prescription  Increase Physical Activity;Increase Strength and Stamina;Understanding of Exercise Prescription;Able to understand and use rate of perceived exertion (RPE) scale;Able to understand and use Dyspnea scale;Knowledge and understanding of Target Heart Rate Range (THRR);Able to check pulse independently  Increase Physical Activity;Increase Strength and Stamina;Able to understand and use rate of perceived exertion (RPE) scale;Knowledge and understanding of Target Heart Rate Range (THRR);Able to check pulse independently;Understanding of Exercise Prescription  Increase Physical Activity;Increase Strength and Stamina;Understanding of Exercise Prescription   Comments  Reviewed RPE scale, THR and  program prescription with pt today.  Pt voiced understanding and was given a copy of goals to take home.  Kashaun is off to a good start in rehab.  He is doing 2.3 mph on the treadmill as the 2.7 mph felt a little fast.  We will continue to montior his progress.  Olon is doing well in rehab.  He has already started to do some stretching and walking at home. Reviewed home exercise with pt today.  Pt plans to walk and use staff videos for exercise.  Reviewed THR, pulse, RPE, sign and symptoms, and when to call 911 or MD.  Also discussed weather considerations and indoor options.  Pt voiced understanding.  Andrea works in [employer] and RPE range.  He has increased to 5 lb weights.  Staff will monitor progress.  Keona has been doing well in rehab.  He tried the ellipitcal just before Christmas and found it challenging.  He is also up to level 4 on the bike.  We will continue to monitor his progress.   Expected Outcomes  Short: Use RPE daily to regulate intensity. Long: Follow program prescription in THR.  Short: Continue to attend regularly.  Long: Continue to follow program prescritpion  Short: Continue to walk at home on off days.  Long: Continue to improve stamina.  Short - continue to exercise conistently Long improve overall stamina  Short: Continue to exercise on off days.  Long: Continue to improve stamina to return to [place].   Maunie Name 05/20/19 1330 06/18/19 1228 06/21/19 1158 07/03/19 1207 07/08/19 1325     Exercise Goal Re-Evaluation   Exercise Goals Review  Increase Physical Activity;Increase Strength and Stamina;Understanding of Exercise Prescription  Increase Physical Activity;Increase Strength and Stamina;Able to understand and use rate of perceived exertion (RPE) scale;Knowledge and understanding of Target Heart Rate Range (THRR);Able to check pulse independently;Understanding of Exercise Prescription;Able to understand and use Dyspnea scale  Increase Physical Activity;Increase Strength and  Stamina;Understanding of Exercise Prescription  Increase Physical Activity;Increase Strength and Stamina;Understanding of Exercise Prescription  Increase Physical Activity;Increase Strength and Stamina;Understanding of Exercise Prescription   Comments  Newel reported that he is exercising about every day. He is walking, using a stepper, using weights, and stretching. He stated that  he is very committed to his cardiac rehab program and is anxious to get back to coming to [facility] 3 days a week. He stated his commitment to continue with home exercise until the program re-opens fully.  Braulio has been trying the elliptical during classes.  It is challenging for him but he is willing to work on increasing time.  Oscar has been doing well with his exercise at home.  He is using his stepper and weights daily. He missed coming to class yesterday as it is also his social outlet.  Clovis is doing well in rehab.  He is up to 3.1 METs on the NuStep.  We will conitnue to montiro his progress.  Deamonte is doing well in rehab.  He is up to 3.1 METs on the NuStep.  We will conitnue to montiro his progress. Pt reports staying active at home. He uses a stepper at home: will do it intermittently such as during commercials. He will also walk up and down the hallway and walking to mailbox. He is doing 15 reps with his weights now.   Expected Outcomes  Short: continue to do home exercise until cardiac rehab fully re-opens, and then come back to exercise in [place] 3 days per week. Long: continue to improve stamina.  Short : work up to 15 min on elliptical without stopping Long :  build stamina  Short: Continue to exercise on off days.  Long: Continue to build stamina.  Short: Add incline to treadmill.  Long: Continue to improve stamina.  Short: Add incline to treadmill.  Long: Continue to improve stamina.   Walford Name 07/16/19 1549 07/29/19 1317 08/15/19 1248         Exercise Goal Re-Evaluation   Exercise Goals Review  Increase Physical  Activity;Increase Strength and Stamina;Able to understand and use rate of perceived exertion (RPE) scale;Able to understand and use Dyspnea scale;Knowledge and understanding of Target Heart Rate Range (THRR);Able to check pulse independently;Understanding of Exercise Prescription  Increase Physical Activity;Increase Strength and Stamina;Understanding of Exercise Prescription  Increase Physical Activity;Increase Strength and Stamina;Able to understand and use rate of perceived exertion (RPE) scale;Able to understand and use Dyspnea scale;Knowledge and understanding of Target Heart Rate Range (THRR);Able to check pulse independently;Understanding of Exercise Prescription     Comments  Kala has missed sessions due to some issues with BP.  He will return when his Dr clears him.  Lindsey is doing well in rehab. He is sore after exercise from getting in a good workout.  He feels that he is getting in better shape and enjoys coming to class.  He is walking at home on his off days.  Bryden enjoys exercise - he told staff he doesnt want to graduate !  H eplasn to go to the [place] after graduation.     Expected Outcomes  Short: get BP under control Long: get back to rehab classes  Short: Continue to exericse on off days.  Long: Continue to be independent in exercise.  Short:  complete HT program Long: maintain exercise on his own        Discharge Exercise Prescription (Final Exercise Prescription Changes): Exercise Prescription Changes - 08/15/19 1200      Response to Exercise   Blood Pressure (Admit)  132/60    Blood Pressure (Exercise)  128/58    Blood Pressure (Exit)  130/70    Heart Rate (Admit)  54 bpm    Heart Rate (Exercise)  107 bpm    Heart Rate (Exit)  75 bpm    Rating of Perceived Exertion (Exercise)  14    Symptoms  none    Duration  Continue with 30 min of aerobic exercise without signs/symptoms of physical distress.    Intensity  THRR unchanged      Progression   Progression  Continue to  progress workloads to maintain intensity without signs/symptoms of physical distress.    Average METs  3.9      Resistance Training   Training Prescription  Yes    Weight  5 lb    Reps  10-15      Interval Training   Interval Training  No      NuStep   Level  4    SPM  50    Minutes  15    METs  4.8      Biostep-RELP   Level  3    SPM  50    Minutes  15    METs  3       Nutrition:  Target Goals: Understanding of nutrition guidelines, daily intake of sodium '1500mg'$ , cholesterol '200mg'$ , calories 30% from fat and 7% or less from saturated fats, daily to have 5 or more servings of fruits and vegetables.  Education: Controlling Sodium/Reading Food Labels -Group verbal and written material supporting the discussion of sodium use in heart healthy nutrition. Review and explanation with models, verbal and written materials for utilization of the food label.   Education: General Nutrition Guidelines/Fats and Fiber: -Group instruction provided by verbal, written material, models and posters to present the general guidelines for heart healthy nutrition. Gives an explanation and review of dietary fats and fiber.   Biometrics: Pre Biometrics - 04/02/19 1510      Pre Biometrics   Height  '5\' 8"'$  (1.727 m)    Weight  175 lb 3.2 oz (79.5 kg)    BMI (Calculated)  26.65    Single Leg Stand  10.31 seconds        Nutrition Therapy Plan and Nutrition Goals: Nutrition Therapy & Goals - 04/04/19 1003      Nutrition Therapy   Diet  Low Na, HH diet    Protein (specify units)  65g    Fiber  30 grams    Whole Grain Foods  3 servings    Saturated Fats  12 max. grams    Fruits and Vegetables  5 servings/day    Sodium  1.5 grams      Personal Nutrition Goals   Nutrition Goal  ST: reduce Na LT: UBW 175#, wants to get down to 165# - wants to gain strength    Comments  Pt reports not eating properly, but is trying. Pt wants to build skills. B: 2 cups coffee with danish or cereal like raisin  bran with whole milk and banana. Drinks water during the day. L:meatloaf, stewed apples with pintos or corn and okra etc. at [employer] (has this the 3 days coming to rehab) and frozen pizza (half) and boiled eggs w/ turnip greens and corned bread and meatloaf with baked beans/ peas and frozen vegetables with meat. Pt reports loving vegetables. S: small box of raisins most days, ritz crackers with jiff peanut butter with a glass of whole milk (1-2x/week). Pt reports loving his families homemade chocolate pie. Will sometimes have a can of tuna (bread and butter pickle juice) with some ritz crackers. Pt reports sodium is a problem for him. Pt also reports likes [place] stew. Discussed general [place] eating.  Pt would like to also work on skill goals, pt reports mild ST memory issues, discussed giving him handouts to keep for review and providing him with youtube skills videos (reports having access to computer).      Intervention Plan   Intervention  Nutrition handout(s) given to patient.;Prescribe, educate and counsel regarding individualized specific dietary modifications aiming towards targeted core components such as weight, hypertension, lipid management, diabetes, heart failure and other comorbidities.    Expected Outcomes  Short Term Goal: Understand basic principles of dietary content, such as calories, fat, sodium, cholesterol and nutrients.;Short Term Goal: A plan has been developed with personal nutrition goals set during dietitian appointment.;Long Term Goal: Adherence to prescribed nutrition plan.       Nutrition Assessments:   MEDIFICTS Score Key:          ?70 Need to make dietary changes          40-70 Heart Healthy Diet         ? 40 Therapeutic Level Cholesterol Diet  Nutrition Goals Re-Evaluation: Nutrition Goals Re-Evaluation    [place] Name 06/25/19 0750 07/08/19 1333 07/29/19 1331         Goals   Nutrition Goal  ST: reduce Na LT: UBW 175#, wants to get down to 165# - wants to  gain strength  ST: rinse off beans (low sodium) LT: UBW 175#, wants to get down to 165# - wants to gain strength  ST: rinse off beans (low sodium) LT: UBW 175#, wants to get down to 165# - wants to gain strength     Comment  Continue with current changes  ST: rinse off beans (low sodium). discussed lower sodium.  continue with current changes. Pt reports blood work and exam came back normal.     Expected Outcome  ST: reduce Na LT: UBW 175#, wants to get down to 165# - wants to gain strength  ST: reduce Na LT: UBW 175#, wants to get down to 165# - wants to gain strength  ST: rinse off beans (low sodium) LT: UBW 175#, wants to get down to 165# - wants to gain strength        Nutrition Goals Discharge (Final Nutrition Goals Re-Evaluation): Nutrition Goals Re-Evaluation - 07/29/19 1331      Goals   Nutrition Goal  ST: rinse off beans (low sodium) LT: UBW 175#, wants to get down to 165# - wants to gain strength    Comment  continue with current changes. Pt reports blood work and exam came back normal.    Expected Outcome  ST: rinse off beans (low sodium) LT: UBW 175#, wants to get down to 165# - wants to gain strength       Psychosocial: Target Goals: Acknowledge presence or absence of significant depression and/or stress, maximize coping skills, provide positive support system. Participant is able to verbalize types and ability to use techniques and skills needed for reducing stress and depression.   Education: Depression - Provides group verbal and written instruction on the correlation between heart/lung disease and depressed mood, treatment options, and the stigmas associated with seeking treatment.   Education: Sleep Hygiene -Provides group verbal and written instruction about how sleep can affect your health.  Define sleep hygiene, discuss sleep cycles and impact of sleep habits. Review good sleep hygiene tips.     Education: Stress and Anxiety: - Provides group verbal and written  instruction about the health risks of elevated stress and causes of high stress.  Discuss the correlation  between heart/lung disease and anxiety and treatment options. Review healthy ways to manage with stress and anxiety.    Initial Review & Psychosocial Screening: Initial Psych Review & Screening - 04/01/19 1223      Initial Review   Current issues with  None Identified    Comments  Wife passed away this 2018/09/20 She had been sick 5-6 years and Willard cared for her.      Family Dynamics   Good Support System?  Yes   Daughter lives above [place], Son in [place], has niece lives up the street  He talks to [employer] all daily. Neighbors keep eye on [employer].     Barriers   Psychosocial barriers to participate in program  There are no identifiable barriers or psychosocial needs.;The patient should benefit from training in stress management and relaxation.      Screening Interventions   Interventions  Encouraged to exercise;Provide feedback about the scores to participant;To provide support and resources with identified psychosocial needs    Expected Outcomes  Short Term goal: Utilizing psychosocial counselor, staff and physician to assist with identification of specific Stressors or current issues interfering with healing process. Setting desired goal for each stressor or current issue identified.;Long Term Goal: Stressors or current issues are controlled or eliminated.;Short Term goal: Identification and review with participant of any Quality of Life or Depression concerns found by scoring the questionnaire.;Long Term goal: The participant improves quality of Life and PHQ9 Scores as seen by post scores and/or verbalization of changes       Quality of Life Scores:  Quality of Life - 04/02/19 1515      Quality of Life   Select  Quality of Life      Quality of Life Scores   Health/Function Pre  27.43 %    Socioeconomic Pre  30 %    Psych/Spiritual Pre  30 %    Family Pre  30 %    GLOBAL Pre   28.84 %      Scores of 19 and below usually indicate a poorer quality of life in these areas.  A difference of  2-3 points is a clinically meaningful difference.  A difference of 2-3 points in the total score of the Quality of Life Index has been associated with significant improvement in overall quality of life, self-image, physical symptoms, and general health in studies assessing change in quality of life.  PHQ-9: Recent Review Flowsheet Data    Depression screen [facility] 2/9 04/02/2019   Decreased Interest 0   Down, Depressed, Hopeless 0   PHQ - 2 Score 0   Altered sleeping 0   Tired, decreased energy 2   Trouble concentrating 0   Moving slowly or fidgety/restless 0   Suicidal thoughts 0   PHQ-9 Score 2   Difficult doing work/chores Not difficult at all     Interpretation of Total Score  Total Score Depression Severity:  1-4 = Minimal depression, 5-9 = Mild depression, 10-14 = Moderate depression, 15-19 = Moderately severe depression, 20-27 = Severe depression   Psychosocial Evaluation and Intervention: Psychosocial Evaluation - 04/01/19 1238      Psychosocial Evaluation & Interventions   Interventions  Encouraged to exercise with the program and follow exercise prescription    Comments  Arne has no barriers to attending the program. He is excited to get started and get out of the house.  His second wife passed away in Sep 20, 2022 this year. She had been sick and required his  care for the past 5-6 years. He wants to lose some weight that he gained while he was not able to be active as he cared for his wife. He has a great support system, HIs children , a niece and his neighbors. He talks to them all daily. He gets around the neighborhood on his golfcart. He plans to attend the full program.    Expected Outcomes  STG: Caylen attends sessions LTG: Dezmond is able to utilize his lifestyle modifications he ahs learned during the program.    Continue Psychosocial Services   Follow up required by staff        Psychosocial Re-Evaluation: Psychosocial Re-Evaluation    [place] Name 04/15/19 1451 05/20/19 1333 06/21/19 1200 07/29/19 1318       Psychosocial Re-Evaluation   Current issues with  Current Stress Concerns  Current Stress Concerns  Current Stress Concerns  None Identified    Comments  Noal is doing well in rehab. He is working on quitting chewing tobacco.  He really wants to build his energy back up to be able to get out to hike again. His son is coming to visit for Christmas week!!  He is looking forward to having him come visit.  He is taking safety protocols for his son. Overall he is feeling good.  Since he has started he has really enjoyed getting to see people and getting the exercise in regularly. He is impressed with how much his energy levels are starting to recover.  Keelyn reports no new stress concerns and states that he has a strong support system.  He is feeling quite frustrated right now.  The cable/phone company has been giving him the run around.  He has been trying to get his TV and phone line working for almost three weeks.  They are now supposed to be coming on Monday afternoon.   He said he has spent almost 3 hours on the phone with them!  Exercise class is his social outlet and he misses it when he can't come.  He is doing well mentally.  He is looking forward to getting back to church fulltime after Easter.  He feels good overall. He is watching and keeping a close eye on his memory.    Expected Outcomes  Short: Enjoy visit with son.  Long: Continue to attend exercise regularly.  Short: Continue to rely on support system for mental health needs. Long: continue to exercise to help manage stress.  Short: Get phone lines fixed.  Long: Continue stay positive.  Short: Continue to maintain memory  Long: Continue to stay positive.    Interventions  Encouraged to attend Cardiac Rehabilitation for the exercise  --  Encouraged to attend Cardiac Rehabilitation for the exercise  Encouraged  to attend Cardiac Rehabilitation for the exercise    Continue Psychosocial Services   Follow up required by staff  --  Follow up required by staff  --       Psychosocial Discharge (Final Psychosocial Re-Evaluation): Psychosocial Re-Evaluation - 07/29/19 1318      Psychosocial Re-Evaluation   Current issues with  None Identified    Comments  He is doing well mentally.  He is looking forward to getting back to church fulltime after Easter.  He feels good overall. He is watching and keeping a close eye on his memory.    Expected Outcomes  Short: Continue to maintain memory  Long: Continue to stay positive.    Interventions  Encouraged to attend Cardiac Rehabilitation for  the exercise       Vocational Rehabilitation: Provide vocational rehab assistance to qualifying candidates.   Vocational Rehab Evaluation & Intervention: Vocational Rehab - 04/01/19 1226      Initial Vocational Rehab Evaluation & Intervention   Assessment shows need for Vocational Rehabilitation  No       Education: Education Goals: Education classes will be provided on a variety of topics geared toward better understanding of heart health and risk factor modification. Participant will state understanding/return demonstration of topics presented as noted by education test scores.  Learning Barriers/Preferences: Learning Barriers/Preferences - 04/01/19 1226      Learning Barriers/Preferences   Learning Barriers  None    Learning Preferences  None       General Cardiac Education Topics:  AED/CPR: - Group verbal and written instruction with the use of models to demonstrate the basic use of the AED with the basic ABC's of resuscitation.   Anatomy & Physiology of the Heart: - Group verbal and written instruction and models provide basic cardiac anatomy and physiology, with the coronary electrical and arterial systems. Review of Valvular disease and Heart Failure   Cardiac Procedures: - Group verbal and  written instruction to review commonly prescribed medications for heart disease. Reviews the medication, class of the drug, and side effects. Includes the steps to properly store meds and maintain the prescription regimen. (beta blockers and nitrates)   Cardiac Medications I: - Group verbal and written instruction to review commonly prescribed medications for heart disease. Reviews the medication, class of the drug, and side effects. Includes the steps to properly store meds and maintain the prescription regimen.   Cardiac Medications II: -Group verbal and written instruction to review commonly prescribed medications for heart disease. Reviews the medication, class of the drug, and side effects. (all other drug classes)    Go Sex-Intimacy & Heart Disease, Get SMART - Goal Setting: - Group verbal and written instruction through game format to discuss heart disease and the return to sexual intimacy. Provides group verbal and written material to discuss and apply goal setting through the application of the S.M.A.R.T. Method.   Other Matters of the Heart: - Provides group verbal, written materials and models to describe Stable Angina and Peripheral Artery. Includes description of the disease process and treatment options available to the cardiac patient.   Infection Prevention: - Provides verbal and written material to individual with discussion of infection control including proper hand washing and proper equipment cleaning during exercise session.   Cardiac Rehab from 04/04/2019 in [facility] Cardiac and Pulmonary Rehab  Date  04/02/19  Educator  AS  Instruction Review Code  1- Verbalizes Understanding      Falls Prevention: - Provides verbal and written material to individual with discussion of falls prevention and safety.   Cardiac Rehab from 04/04/2019 in [facility] Cardiac and Pulmonary Rehab  Date  04/02/19  Educator  AS  Instruction Review Code  1- Verbalizes Understanding       Other: -Provides group and verbal instruction on various topics (see comments)   Knowledge Questionnaire Score: Knowledge Questionnaire Score - 04/02/19 1514      Knowledge Questionnaire Score   Pre Score  19/26 nutrition       Core Components/Risk Factors/Patient Goals at Admission: Personal Goals and Risk Factors at Admission - 04/02/19 1511      Core Components/Risk Factors/Patient Goals on Admission    Weight Management  Weight Maintenance;Yes    Intervention  Weight Management: Develop a combined  nutrition and exercise program designed to reach desired caloric intake, while maintaining appropriate intake of nutrient and fiber, sodium and fats, and appropriate energy expenditure required for the weight goal.;Weight Management: Provide education and appropriate resources to help participant work on and attain dietary goals.    Admit Weight  175 lb 3.2 oz (79.5 kg)    Expected Outcomes  Short Term: Continue to assess and modify interventions until short term weight is achieved;Long Term: Adherence to nutrition and physical activity/exercise program aimed toward attainment of established weight goal;Weight Maintenance: Understanding of the daily nutrition guidelines, which includes 25-35% calories from fat, 7% or less cal from saturated fats, less than '200mg'$  cholesterol, less than 1.5gm of sodium, & 5 or more servings of fruits and vegetables daily    Intervention  Assist the participant in steps to quit. Provide individualized education and counseling about committing to Tobacco Cessation, relapse prevention, and pharmacological support that can be provided by physician.;Advice worker, assist with locating and accessing local/national Quit Smoking programs, and support quit date choice.    Expected Outcomes  Short Term: Will demonstrate readiness to quit, by selecting a quit date.;Short Term: Will quit all tobacco product use, adhering to prevention of relapse plan.;Long  Term: Complete abstinence from all tobacco products for at least 12 months from quit date.       Education:Diabetes - Individual verbal and written instruction to review signs/symptoms of diabetes, desired ranges of glucose level fasting, after meals and with exercise. Acknowledge that pre and post exercise glucose checks will be done for 3 sessions at entry of program.   Education: Know Your Numbers and Risk Factors: -Group verbal and written instruction about important numbers in your health.  Discussion of what are risk factors and how they play a role in the disease process.  Review of Cholesterol, Blood Pressure, Diabetes, and BMI and the role they play in your overall health.   Core Components/Risk Factors/Patient Goals Review:  Goals and Risk Factor Review    [place] Name 04/15/19 1447 05/20/19 1332 06/21/19 1202 07/08/19 1327 07/29/19 1320     Core Components/Risk Factors/Patient Goals Review   Personal Goals Review  Weight Management/Obesity;Tobacco Cessation;Hypertension;Lipids  Weight Management/Obesity;Tobacco Cessation;Hypertension;Lipids  Weight Management/Obesity;Hypertension  Weight Management/Obesity;Hypertension  Weight Management/Obesity;Hypertension   Review  Kadien is doing well in rehab.  He is already down to 165 lb at home!  He is pleased with his progress and wants to continue to build muscle and energy levels.  Blood pressures have been good and he does check them at home.  He notices when it runs high/low based on how he feels.  He is doing well on his medications.  He has cut back on chewing tobacco and is making strides towards quitting altogether.  He wants to go slowly but is mentally ready to quit.  Zoltan reports that he is taking his meds a prescribed by his doctor. He is maintaining a steady weight. Does not smoke, but still uses chewing tobacco.  His pressures have been doing good and his weight has been fairly steady. Surprisingly he is not gaining weight since he  makes an afternoon out of his exercise session by going to [employer] after class for late lunch/early dinner.  His pressures have been doing good and his weight has been fairly steady.  Yoshio is doing well in rehab.  Blood pressure and weights have been doing good.  He is holding steady.  He is working on staying hydrating to help manage  blood pressures.   Expected Outcomes  Short: Continue to work toward tobacco cessation.  Long: Continue to build stamina.  Short: Continue to work toward tobacco cessation. Long: Continue to build stamina.  Short: Continue to monitor weight with his diet.  Long; Continue to montior risk factors.  Short: Continue to monitor weight with his diet.  Long; Continue to montior risk factors.  Short: Continue to keep eye on blood pressue.  Long: Continue to monitor risk factors.      Core Components/Risk Factors/Patient Goals at Discharge (Final Review):  Goals and Risk Factor Review - 07/29/19 1320      Core Components/Risk Factors/Patient Goals Review   Personal Goals Review  Weight Management/Obesity;Hypertension    Review  Cowan is doing well in rehab.  Blood pressure and weights have been doing good.  He is holding steady.  He is working on staying hydrating to help manage blood pressures.    Expected Outcomes  Short: Continue to keep eye on blood pressue.  Long: Continue to monitor risk factors.       ITP Comments: ITP Comments    [place] Name 04/01/19 1256 04/02/19 1521 04/03/19 1423 04/04/19 1033 05/01/19 0844   ITP Comments  Virtual Orientation call completed. Has appointment tomorrow with Ep for Eval and Gym Orientation. Diagnosis documentation can be found in [facility] 10/17/2018.  6MWT completed and initial ITP created and sent to Dr Sabra Heck.  First full day of exercise!  Patient was oriented to gym and equipment including functions, settings, policies, and procedures.  Patient's individual exercise prescription and treatment plan were reviewed.  All starting  workloads were established based on the results of the 6 minute walk test done at initial orientation visit.  The plan for exercise progression was also introduced and progression will be customized based on patient's performance and goals  Completed Initial RD eval  30 day review competed . ITP sent to Dr Emily Filbert for review, changes as needed and ITP approval signature   [place] Name 05/06/19 1348 05/20/19 1334 05/22/19 1422 05/27/19 1424 05/29/19 1212   ITP Comments  Checked in with patient since he is not in person this week. Stated he was doing really well. He is walking and doing exercises with weights. He reports that he is very thankful for all we have done and wants to keep working hard to feel better.  Virtual F/U call today:Nevaeh reported that he is exercising about every day. He is walking, using a stepper, using weights, and stretching. He stated that he is very committed to his cardiac rehab program and is anxious to get back to coming to [facility] 3 days a week. He stated his commitment to continue with home exercise until the program re-opens fully. Ovidio reports that he is taking his meds a prescribed by his doctor. He is maintaining a steady weight. Does not smoke, but still uses chewing tobacco. Cliffard reports no new stress concerns and states that he has a strong support system.  Axcel has not attended in person since last review.  Cardiac Rehab is operating at minimal capacity, RD unable to get nutrition re-eval this 30 day cycle.  30 day review completed. ITP sent to Dr. Emily Filbert, Medical Director of Cardiac and Pulmonary Rehab. Continue with ITP unless changes are made by physician.  Department operating under reduced schedule until further notice by request from hospital leadership.   [place] Name 06/03/19 1318 06/21/19 1157 06/26/19 0630 07/24/19 0621 08/21/19 0549   ITP Comments  Virtual call completed today. Calls are done every week that patient is not attending an onsite exercise session  during the COVID 19 PHE Crisis. Today's call included review of : their home exercise, their personal goals, a psychosocial review.Aadyn reports exercising very day using a stepper and walking. He reports using 5 lb weights for resistive work and has no symptoms or concerns.  Virtual follow up appt completed today for goal review.  Pt also noted that he will be out on Monday as the cable company is coming to fix his phone line that afternoon.  30 day chart review completed. ITP sent to Dr Zachery Dakins Medical Director, for review,changes as needed and signature.  30 day chart review completed. ITP sent to Dr Zachery Dakins Medical Director, for review,changes as needed and signature. Continue with ITP if no changes requested  30 Day review completed. Medical Director review done, changes made as directed,and approval shown by signature of Market researcher.      Comments:

## 2019-08-21 NOTE — Progress Notes (Signed)
Daily Session Note  Patient Details  Name: Patrick Wilson MRN: [id number] Date of Birth: 1933/10/04 Referring Provider:     Cardiac Rehab from 04/02/2019 in [facility] Cardiac and Pul[facility] Rehab  Referring Provider  [name]      Encounter Date: 08/21/2019  Check In: Session Check In - 08/21/19 1348      Check-In   Supervising physician immediately available to respond to emergencies  See telemetry face sheet for immediately available ER MD    Location  [facility]-Cardiac & Pulmonary Rehab    Staff Present  Renita Papa, RN BSN;Jessica Luan Pulling, MA, RCEP, CCRP, CCET;Melissa Kincora RDN, [place], [place], ACSM CEP, Exercise Physiologist    Virtual Visit  No    Medication changes reported      No    Fall or balance concerns reported     No    Warm-up and Cool-down  Performed on first and last piece of equipment    Resistance Training Performed  Yes    VAD Patient?  No    PAD/SET Patient?  No      Pain Assessment   Currently in Pain?  No/denies          Social History   Tobacco Use  Smoking Status Never Smoker  Smokeless Tobacco Current User  . Types: Chew  Tobacco Comment   May be ready to quit.    Goals Met:  Independence with exercise equipment Exercise tolerated well No report of cardiac concerns or symptoms Strength training completed today  Goals Unmet:  Not Applicable  Comments: Pt able to follow exercise prescription today without complaint.  Will continue to monitor for progression.  [place] Name 04/02/19 1458 08/21/19 1348       6 Minute Walk   Phase  Initial  Discharge    Distance  1600 feet  1875 feet    Distance % Change  --  17.2 %    Distance Feet Change  --  275 ft    Walk Time  6 minutes  6 minutes    # of Rest Breaks  0  0    MPH  3.03  3.55    METS  2.98  3.29    RPE  12  15    Perceived Dyspnea   1  --    VO2 Peak  10.44  11.53    Symptoms  No  No    Resting HR  65 bpm  65 bpm    Resting BP  134/68  132/74     Resting Oxygen Saturation   97 %  --    Exercise Oxygen Saturation  during 6 min walk  95 %  --    Max Ex. HR  2.31 bpm  95 bpm    Max Ex. BP  148/64  138/76    2 Minute Post BP  138/74  --        Dr. Emily Filbert is Medical Director for [place] and [facility].

## 2019-08-22 ENCOUNTER — Other Ambulatory Visit: Payer: Self-pay

## 2019-08-22 ENCOUNTER — Encounter: Payer: Medicare HMO | Admitting: *Deleted

## 2019-08-22 DIAGNOSIS — I252 Old myocardial infarction: Secondary | ICD-10-CM | POA: Diagnosis not present

## 2019-08-22 DIAGNOSIS — I213 ST elevation (STEMI) myocardial infarction of unspecified site: Secondary | ICD-10-CM | POA: Diagnosis not present

## 2019-08-22 DIAGNOSIS — Z7982 Long term (current) use of aspirin: Secondary | ICD-10-CM | POA: Diagnosis not present

## 2019-08-22 DIAGNOSIS — K219 Gastro-esophageal reflux disease without esophagitis: Secondary | ICD-10-CM | POA: Diagnosis not present

## 2019-08-22 DIAGNOSIS — I2510 Atherosclerotic heart disease of native coronary artery without angina pectoris: Secondary | ICD-10-CM | POA: Diagnosis not present

## 2019-08-22 DIAGNOSIS — F329 Major depressive disorder, single episode, unspecified: Secondary | ICD-10-CM | POA: Diagnosis not present

## 2019-08-22 DIAGNOSIS — Z79899 Other long term (current) drug therapy: Secondary | ICD-10-CM | POA: Diagnosis not present

## 2019-08-22 DIAGNOSIS — Z85828 Personal history of other malignant neoplasm of skin: Secondary | ICD-10-CM | POA: Diagnosis not present

## 2019-08-22 DIAGNOSIS — I10 Essential (primary) hypertension: Secondary | ICD-10-CM | POA: Diagnosis not present

## 2019-08-22 NOTE — Progress Notes (Signed)
Daily Session Note  Patient Details  Name: Patrick Wilson MRN: [id number] Date of Birth: 1933/09/25 Referring Provider:     Cardiac Rehab from 04/02/2019 in [facility] Cardiac and Pulmonary Rehab  Referring Provider  [name]      Encounter Date: 08/22/2019  Check In: Session Check In - 08/22/19 1316      Check-In   Supervising physician immediately available to respond to emergencies  See telemetry face sheet for immediately available ER MD    Location  [facility]    Staff Present  Heath Lark, RN, BSN, CCRP;Meredith Sherryll Burger, RN BSN;Joseph Foy [place], [place], ACSM CEP, Exercise Physiologist    Virtual Visit  No    Medication changes reported      No    Fall or balance concerns reported     No    Warm-up and Cool-down  Performed on first and last piece of equipment    Resistance Training Performed  Yes    VAD Patient?  No    PAD/SET Patient?  No      Pain Assessment   Currently in Pain?  No/denies          Social History   Tobacco Use  Smoking Status Never Smoker  Smokeless Tobacco Current User  . Types: Chew  Tobacco Comment   May be ready to quit.    Goals Met:  Independence with exercise equipment Exercise tolerated well No report of cardiac concerns or symptoms  Goals Unmet:  Not Applicable  Comments: Pt able to follow exercise prescription today without complaint.  Will continue to monitor for progression.    Dr. Emily Filbert is Medical Director for [place] and [facility].

## 2019-08-26 ENCOUNTER — Encounter: Payer: Medicare HMO | Admitting: *Deleted

## 2019-08-26 ENCOUNTER — Other Ambulatory Visit: Payer: Self-pay

## 2019-08-26 DIAGNOSIS — I213 ST elevation (STEMI) myocardial infarction of unspecified site: Secondary | ICD-10-CM

## 2019-08-26 DIAGNOSIS — Z85828 Personal history of other malignant neoplasm of skin: Secondary | ICD-10-CM | POA: Diagnosis not present

## 2019-08-26 DIAGNOSIS — I2510 Atherosclerotic heart disease of native coronary artery without angina pectoris: Secondary | ICD-10-CM | POA: Diagnosis not present

## 2019-08-26 DIAGNOSIS — I252 Old myocardial infarction: Secondary | ICD-10-CM | POA: Diagnosis not present

## 2019-08-26 DIAGNOSIS — Z79899 Other long term (current) drug therapy: Secondary | ICD-10-CM | POA: Diagnosis not present

## 2019-08-26 DIAGNOSIS — K219 Gastro-esophageal reflux disease without esophagitis: Secondary | ICD-10-CM | POA: Diagnosis not present

## 2019-08-26 DIAGNOSIS — Z7982 Long term (current) use of aspirin: Secondary | ICD-10-CM | POA: Diagnosis not present

## 2019-08-26 DIAGNOSIS — F329 Major depressive disorder, single episode, unspecified: Secondary | ICD-10-CM | POA: Diagnosis not present

## 2019-08-26 DIAGNOSIS — I10 Essential (primary) hypertension: Secondary | ICD-10-CM | POA: Diagnosis not present

## 2019-08-26 NOTE — Progress Notes (Signed)
Daily Session Note  Patient Details  Name: Patrick Wilson MRN: [id number] Date of Birth: 03-Feb-1934 Referring Provider:     Cardiac Rehab from 04/02/2019 in [facility] Card[facility] Re[facility]  Referring Provider  [name]      Encounter Date: 08/26/2019  Check In: Session Check In - 08/26/19 1344      Check-In   Supervising physician immediately available to respond to emergencies  See telemetry face sheet for immediately available ER MD    Location  [facility]    Staff Present  Heath Lark, RN, BSN, CCRP;Joseph Hood RCP,RRT,BSRT;Kelly [place], [place], ACSM CEP, Exercise Physiologist    Virtual Visit  No    Medication changes reported      No    Fall or balance concerns reported     No    Warm-up and Cool-down  Performed on first and last piece of equipment    Resistance Training Performed  Yes    VAD Patient?  No    PAD/SET Patient?  No      Pain Assessment   Currently in Pain?  No/denies          Social History   Tobacco Use  Smoking Status Never Smoker  Smokeless Tobacco Current User  . Types: Chew  Tobacco Comment   May be ready to quit.    Goals Met:  Independence with exercise equipment Exercise tolerated well No report of cardiac concerns or symptoms  Goals Unmet:  Not Applicable  Comments: Pt able to follow exercise prescription today without complaint.  Will continue to monitor for progression.    Dr. Emily Filbert is Medical Director for [place] and [facility].

## 2019-08-28 ENCOUNTER — Other Ambulatory Visit: Payer: Self-pay

## 2019-08-28 ENCOUNTER — Encounter: Payer: Medicare HMO | Admitting: *Deleted

## 2019-08-28 DIAGNOSIS — Z7982 Long term (current) use of aspirin: Secondary | ICD-10-CM | POA: Diagnosis not present

## 2019-08-28 DIAGNOSIS — Z79899 Other long term (current) drug therapy: Secondary | ICD-10-CM | POA: Diagnosis not present

## 2019-08-28 DIAGNOSIS — I2510 Atherosclerotic heart disease of native coronary artery without angina pectoris: Secondary | ICD-10-CM | POA: Diagnosis not present

## 2019-08-28 DIAGNOSIS — K219 Gastro-esophageal reflux disease without esophagitis: Secondary | ICD-10-CM | POA: Diagnosis not present

## 2019-08-28 DIAGNOSIS — F329 Major depressive disorder, single episode, unspecified: Secondary | ICD-10-CM | POA: Diagnosis not present

## 2019-08-28 DIAGNOSIS — I252 Old myocardial infarction: Secondary | ICD-10-CM | POA: Diagnosis not present

## 2019-08-28 DIAGNOSIS — I213 ST elevation (STEMI) myocardial infarction of unspecified site: Secondary | ICD-10-CM | POA: Diagnosis not present

## 2019-08-28 DIAGNOSIS — I10 Essential (primary) hypertension: Secondary | ICD-10-CM | POA: Diagnosis not present

## 2019-08-28 DIAGNOSIS — Z85828 Personal history of other malignant neoplasm of skin: Secondary | ICD-10-CM | POA: Diagnosis not present

## 2019-08-28 NOTE — Progress Notes (Signed)
Daily Session Note  Patient Details  Name: Patrick Wilson MRN: [id number] Date of Birth: Sep 17, 1933 Referring Provider:     Cardiac Rehab from 04/02/2019 in [facility] Cardiac and Pulmonary Rehab  Referring Provider  [name]      Encounter Date: 08/28/2019  Check In: Session Check In - 08/28/19 1329      Check-In   Supervising physician immediately available to respond to emergencies  See telemetry face sheet for immediately available ER MD    Location  [facility]    Staff Present  Renita Papa, RN BSN;Jessica Luan Pulling, MA, RCEP, CCRP, CCET;Miller [place], RN, BSN, CCRP;Melissa Caiola RDN, LDN    Virtual Visit  No    Medication changes reported      No    Fall or balance concerns reported     No    Warm-up and Cool-down  Performed on first and last piece of equipment    Resistance Training Performed  Yes    VAD Patient?  No      Pain Assessment   Currently in Pain?  No/denies          Social History   Tobacco Use  Smoking Status Never Smoker  Smokeless Tobacco Current User  . Types: Chew  Tobacco Comment   May be ready to quit.    Goals Met:  Independence with exercise equipment Exercise tolerated well No report of cardiac concerns or symptoms  Goals Unmet:  Not Applicable  Comments: Pt able to follow exercise prescription today without complaint.  Will continue to monitor for progression.    Dr. Emily Filbert is Medical Director for [place] and [facility].

## 2019-08-29 ENCOUNTER — Other Ambulatory Visit: Payer: Self-pay

## 2019-08-29 ENCOUNTER — Encounter: Payer: Medicare HMO | Admitting: *Deleted

## 2019-08-29 DIAGNOSIS — I213 ST elevation (STEMI) myocardial infarction of unspecified site: Secondary | ICD-10-CM | POA: Diagnosis not present

## 2019-08-29 DIAGNOSIS — K219 Gastro-esophageal reflux disease without esophagitis: Secondary | ICD-10-CM | POA: Diagnosis not present

## 2019-08-29 DIAGNOSIS — Z7982 Long term (current) use of aspirin: Secondary | ICD-10-CM | POA: Diagnosis not present

## 2019-08-29 DIAGNOSIS — Z85828 Personal history of other malignant neoplasm of skin: Secondary | ICD-10-CM | POA: Diagnosis not present

## 2019-08-29 DIAGNOSIS — I2510 Atherosclerotic heart disease of native coronary artery without angina pectoris: Secondary | ICD-10-CM | POA: Diagnosis not present

## 2019-08-29 DIAGNOSIS — I10 Essential (primary) hypertension: Secondary | ICD-10-CM | POA: Diagnosis not present

## 2019-08-29 DIAGNOSIS — I252 Old myocardial infarction: Secondary | ICD-10-CM | POA: Diagnosis not present

## 2019-08-29 DIAGNOSIS — Z79899 Other long term (current) drug therapy: Secondary | ICD-10-CM | POA: Diagnosis not present

## 2019-08-29 DIAGNOSIS — F329 Major depressive disorder, single episode, unspecified: Secondary | ICD-10-CM | POA: Diagnosis not present

## 2019-08-29 NOTE — Progress Notes (Signed)
Daily Session Note  Patient Details  Name: Patrick Wilson MRN: [id number] Date of Birth: [birth year] Referring Provider:     Cardiac Rehab from 04/02/2019 in [facility] Cardiac and Pulmonary Rehab  Referring Provider  [name]      Encounter Date: 08/29/2019  Check In: Session Check In - 08/29/19 1300      Check-In   Supervising physician immediately available to respond to emergencies  See telemetry face sheet for immediately available ER MD    Location  [facility] Pul[facility]    Staff Present  Renita Papa, RN BSN;Joseph [address] [place], [place], [place], CCRP, CCET    Virtual Visit  No    Medication changes reported      No    Fall or balance concerns reported     No    Warm-up and Cool-down  Performed on first and last piece of equipment    Resistance Training Performed  Yes    VAD Patient?  No    PAD/SET Patient?  No      Pain Assessment   Currently in Pain?  No/denies          Social History   Tobacco Use  Smoking Status Never Smoker  Smokeless Tobacco Current User  . Types: Chew  Tobacco Comment   May be ready to quit.    Goals Met:  Independence with exercise equipment Exercise tolerated well No report of cardiac concerns or symptoms Strength training completed today  Goals Unmet:  Not Applicable  Comments: Pt able to follow exercise prescription today without complaint.  Will continue to monitor for progression.    Dr. Emily Filbert is Medical Director for [place] and [facility].

## 2019-08-29 NOTE — Patient Instructions (Signed)
Discharge Patient Instructions  Patient Details  Name: Patrick Wilson MRN: [id number] Date of Birth: [birth year] Referring Provider:  Rusty Aus, MD   Number of Visits: 36  Reason for Discharge:  Patient reached a stable level of exercise. Patient independent in their exercise. Patient has met program and personal goals.  Smoking History:  Social History   Tobacco Use  Smoking Status Never Smoker  Smokeless Tobacco Current User  . Types: Chew  Tobacco Comment   May be ready to quit.    Diagnosis:  ST elevation myocardial infarction (STEMI), unspecified artery [facility])  Initial Exercise Prescription: Initial Exercise Prescription - 04/02/19 1500      Date of Initial Exercise RX and Referring Provider   Date  04/02/19    Referring Provider  [name]     Treadmill   MPH  2.7    Grade  0    Minutes  15    METs  3.07      Recumbant Bike   Level  3    RPM  60    Watts  25    Minutes  15    METs  3      Elliptical   Level  1    Speed  3    Minutes  15      REL-XR   Level  3    Speed  50    Minutes  15    METs  3      T5 Nustep   Level  2    SPM  80    Minutes  15    METs  3      Biostep-RELP   Level  3    SPM  50    Minutes  15    METs  3      Prescription Details   Frequency (times per week)  3    Duration  Progress to 30 minutes of continuous aerobic without signs/symptoms of physical distress      Intensity   THRR 40-80% of Max Heartrate  94-122    Ratings of Perceived Exertion  11-15    Perceived Dyspnea  0-4      Resistance Training   Training Prescription  Yes    Weight  4 lb    Reps  10-15       Discharge Exercise Prescription (Final Exercise Prescription Changes): Exercise Prescription Changes - 08/27/19 1500      Response to Exercise   Blood Pressure (Admit)  132/60    Blood Pressure (Exercise)  146/62    Blood Pressure (Exit)  100/60    Heart Rate (Admit)  65 bpm    Heart Rate (Exercise)  104 bpm    Heart  Rate (Exit)  68 bpm    Rating of Perceived Exertion (Exercise)  19    Symptoms  none    Duration  Continue with 30 min of aerobic exercise without signs/symptoms of physical distress.    Intensity  THRR unchanged      Progression   Progression  Continue to progress workloads to maintain intensity without signs/symptoms of physical distress.    Average METs  3.05      Resistance Training   Training Prescription  Yes    Weight  5 lb    Reps  10-15      Interval Training   Interval Training  No      Treadmill   MPH  1.8  Grade  2    Minutes  15    METs  2.87      Recumbant Bike   Level  6    Watts  43    Minutes  15    METs  3.89      Arm Ergometer   Level  1    Minutes  15    METs  2.4      Elliptical   Level  1    Speed  3    Minutes  15      Home Exercise Plan   Plans to continue exercise at  Home (comment)   walking, return to [place] 3 additional days to program exercise sessions.    Initial Home Exercises Provided  04/15/19       Functional Capacity: 6 Minute Walk    Row Name 04/02/19 1458 08/21/19 1348       6 Minute Walk   Phase  Initial  Discharge    Distance  1600 feet  1875 feet    Distance % Change  --  17.2 %    Distance Feet Change  --  275 ft    Walk Time  6 minutes  6 minutes    # of Rest Breaks  0  0    MPH  3.03  3.55    METS  2.98  3.29    RPE  12  15    Perceived Dyspnea   1  --    VO2 Peak  10.44  11.53    Symptoms  No  No    Resting HR  65 bpm  65 bpm    Resting BP  134/68  132/74    Resting Oxygen Saturation   97 %  --    Exercise Oxygen Saturation  during 6 min walk  95 %  --    Max Ex. HR  2.31 bpm  95 bpm    Max Ex. BP  148/64  138/76    2 Minute Post BP  138/74  --       Quality of Life: Quality of Life - 04/02/19 1515      Quality of Life   Select  Quality of Life      Quality of Life Scores   Health/Function Pre  27.43 %    Socioeconomic Pre  30 %    Psych/Spiritual Pre  30 %    Family  Pre  30 %    GLOBAL Pre  28.84 %       Personal Goals: Goals established at orientation with interventions provided to work toward goal. Personal Goals and Risk Factors at Admission - 04/02/19 1511      Core Components/Risk Factors/Patient Goals on Admission    Weight Management  Weight Maintenance;Yes    Intervention  Weight Management: Develop a combined nutrition and exercise program designed to reach desired caloric intake, while maintaining appropriate intake of nutrient and fiber, sodium and fats, and appropriate energy expenditure required for the weight goal.;Weight Management: Provide education and appropriate resources to help participant work on and attain dietary goals.    Admit Weight  175 lb 3.2 oz (79.5 kg)    Expected Outcomes  Short Term: Continue to assess and modify interventions until short term weight is achieved;Long Term: Adherence to nutrition and physical activity/exercise program aimed toward attainment of established weight goal;Weight Maintenance: Understanding of the daily nutrition guidelines, which includes 25-35% calories from  fat, 7% or less cal from saturated fats, less than '200mg'$  cholesterol, less than 1.5gm of sodium, & 5 or more servings of fruits and vegetables daily    Intervention  Assist the participant in steps to quit. Provide individualized education and counseling about committing to Tobacco Cessation, relapse prevention, and pharmacological support that can be provided by physician.;Advice worker, assist with locating and accessing local/national Quit Smoking programs, and support quit date choice.    Expected Outcomes  Short Term: Will demonstrate readiness to quit, by selecting a quit date.;Short Term: Will quit all tobacco product use, adhering to prevention of relapse plan.;Long Term: Complete abstinence from all tobacco products for at least 12 months from quit date.        Personal Goals Discharge: Goals and Risk Factor Review -  07/29/19 1320      Core Components/Risk Factors/Patient Goals Review   Personal Goals Review  Weight Management/Obesity;Hypertension    Review  Patrick Wilson is doing well in rehab.  Blood pressure and weights have been doing good.  He is holding steady.  He is working on staying hydrating to help manage blood pressures.    Expected Outcomes  Short: Continue to keep eye on blood pressue.  Long: Continue to monitor risk factors.       Exercise Goals and Review: Exercise Goals    Row Name 04/02/19 1510             Exercise Goals   Increase Physical Activity  Yes       Intervention  Provide advice, education, support and counseling about physical activity/exercise needs.;Develop an individualized exercise prescription for aerobic and resistive training based on initial evaluation findings, risk stratification, comorbidities and participant's personal goals.       Expected Outcomes  Short Term: Attend rehab on a regular basis to increase amount of physical activity.;Long Term: Add in home exercise to make exercise part of routine and to increase amount of physical activity.;Long Term: Exercising regularly at least 3-5 days a week.       Increase Strength and Stamina  Yes       Intervention  Provide advice, education, support and counseling about physical activity/exercise needs.;Develop an individualized exercise prescription for aerobic and resistive training based on initial evaluation findings, risk stratification, comorbidities and participant's personal goals.       Expected Outcomes  Short Term: Increase workloads from initial exercise prescription for resistance, speed, and METs.;Short Term: Perform resistance training exercises routinely during rehab and add in resistance training at home;Long Term: Improve cardiorespiratory fitness, muscular endurance and strength as measured by increased METs and functional capacity (6MWT)       Able to understand and use rate of perceived exertion (RPE) scale   Yes       Intervention  Provide education and explanation on how to use RPE scale       Expected Outcomes  Short Term: Able to use RPE daily in rehab to express subjective intensity level;Long Term:  Able to use RPE to guide intensity level when exercising independently       Knowledge and understanding of Target Heart Rate Range (THRR)  Yes       Intervention  Provide education and explanation of THRR including how the numbers were predicted and where they are located for reference       Expected Outcomes  Short Term: Able to state/look up THRR;Short Term: Able to use daily as guideline for intensity in rehab;Long Term: Able to use  THRR to govern intensity when exercising independently       Able to check pulse independently  Yes       Intervention  Provide education and demonstration on how to check pulse in carotid and radial arteries.;Review the importance of being able to check your own pulse for safety during independent exercise       Expected Outcomes  Short Term: Able to explain why pulse checking is important during independent exercise;Long Term: Able to check pulse independently and accurately       Understanding of Exercise Prescription  Yes       Intervention  Provide education, explanation, and written materials on patient's individual exercise prescription       Expected Outcomes  Short Term: Able to explain program exercise prescription;Long Term: Able to explain home exercise prescription to exercise independently          Exercise Goals Re-Evaluation: Exercise Goals Re-Evaluation    Row Name 04/03/19 1424 04/10/19 1131 04/15/19 1456 04/23/19 1318 05/10/19 0936     Exercise Goal Re-Evaluation   Exercise Goals Review  Increase Physical Activity;Able to understand and use rate of perceived exertion (RPE) scale;Knowledge and understanding of Target Heart Rate Range (THRR);Understanding of Exercise Prescription;Increase Strength and Stamina;Able to check pulse independently   Increase Physical Activity;Increase Strength and Stamina;Understanding of Exercise Prescription  Increase Physical Activity;Increase Strength and Stamina;Understanding of Exercise Prescription;Able to understand and use rate of perceived exertion (RPE) scale;Able to understand and use Dyspnea scale;Knowledge and understanding of Target Heart Rate Range (THRR);Able to check pulse independently  Increase Physical Activity;Increase Strength and Stamina;Able to understand and use rate of perceived exertion (RPE) scale;Knowledge and understanding of Target Heart Rate Range (THRR);Able to check pulse independently;Understanding of Exercise Prescription  Increase Physical Activity;Increase Strength and Stamina;Understanding of Exercise Prescription   Comments  Reviewed RPE scale, THR and program prescription with pt today.  Pt voiced understanding and was given a copy of goals to take home.  Patrick Wilson is off to a good start in rehab.  He is doing 2.3 mph on the treadmill as the 2.7 mph felt a little fast.  We will continue to montior his progress.  Patrick Wilson is doing well in rehab.  He has already started to do some stretching and walking at home. Reviewed home exercise with pt today.  Pt plans to walk and use staff videos for exercise.  Reviewed THR, pulse, RPE, sign and symptoms, and when to call 911 or MD.  Also discussed weather considerations and indoor options.  Pt voiced understanding.  Patrick Wilson works in [employer] and RPE range.  He has increased to 5 lb weights.  Staff will monitor progress.  Patrick Wilson has been doing well in rehab.  He tried the ellipitcal just before Christmas and found it challenging.  He is also up to level 4 on the bike.  We will continue to monitor his progress.   Expected Outcomes  Short: Use RPE daily to regulate intensity. Long: Follow program prescription in THR.  Short: Continue to attend regularly.  Long: Continue to follow program prescritpion  Short: Continue to walk at home on off days.  Long: Continue  to improve stamina.  Short - continue to exercise conistently Long improve overall stamina  Short: Continue to exercise on off days.  Long: Continue to improve stamina to return to [place].   [place] Name 05/20/19 1330 06/18/19 1228 06/21/19 1158 07/03/19 1207 07/08/19 1325     Exercise Goal Re-Evaluation   Exercise Goals Review  Increase Physical Activity;Increase Strength and Stamina;Understanding of Exercise Prescription  Increase Physical Activity;Increase Strength and Stamina;Able to understand and use rate of perceived exertion (RPE) scale;Knowledge and understanding of Target Heart Rate Range (THRR);Able to check pulse independently;Understanding of Exercise Prescription;Able to understand and use Dyspnea scale  Increase Physical Activity;Increase Strength and Stamina;Understanding of Exercise Prescription  Increase Physical Activity;Increase Strength and Stamina;Understanding of Exercise Prescription  Increase Physical Activity;Increase Strength and Stamina;Understanding of Exercise Prescription   Comments  Patrick Wilson reported that he is exercising about every day. He is walking, using a stepper, using weights, and stretching. He stated that he is very committed to his cardiac rehab program and is anxious to get back to coming to [facility] 3 days a week. He stated his commitment to continue with home exercise until the program re-opens fully.  Patrick Wilson has been trying the elliptical during classes.  It is challenging for him but he is willing to work on increasing time.  Patrick Wilson has been doing well with his exercise at home.  He is using his stepper and weights daily. He missed coming to class yesterday as it is also his social outlet.  Patrick Wilson is doing well in rehab.  He is up to 3.1 METs on the NuStep.  We will conitnue to montiro his progress.  Patrick Wilson is doing well in rehab.  He is up to 3.1 METs on the NuStep.  We will conitnue to montiro his progress. Pt reports staying active at home. He uses a stepper at home: will do  it intermittently such as during commercials. He will also walk up and down the hallway and walking to mailbox. He is doing 15 reps with his weights now.   Expected Outcomes  Short: continue to do home exercise until cardiac rehab fully re-opens, and then come back to exercise in [facility] 3 days per week. Long: continue to improve stamina.  Short : work up to 15 min on elliptical without stopping Long :  build stamina  Short: Continue to exercise on off days.  Long: Continue to build stamina.  Short: Add incline to treadmill.  Long: Continue to improve stamina.  Short: Add incline to treadmill.  Long: Continue to improve stamina.   [place] Name 07/16/19 1549 07/29/19 1317 08/15/19 1248 08/27/19 1539       Exercise Goal Re-Evaluation   Exercise Goals Review  Increase Physical Activity;Increase Strength and Stamina;Able to understand and use rate of perceived exertion (RPE) scale;Able to understand and use Dyspnea scale;Knowledge and understanding of Target Heart Rate Range (THRR);Able to check pulse independently;Understanding of Exercise Prescription  Increase Physical Activity;Increase Strength and Stamina;Understanding of Exercise Prescription  Increase Physical Activity;Increase Strength and Stamina;Able to understand and use rate of perceived exertion (RPE) scale;Able to understand and use Dyspnea scale;Knowledge and understanding of Target Heart Rate Range (THRR);Able to check pulse independently;Understanding of Exercise Prescription  Increase Physical Activity;Increase Strength and Stamina;Understanding of Exercise Prescription    Comments  Patrick Wilson has missed sessions due to some issues with BP.  He will return when his Dr clears him.  Patrick Wilson is doing well in rehab. He is sore after exercise from getting in a good workout.  He feels that he is getting in better shape and enjoys coming to class.  He is walking at home on his off days.  Patrick Wilson enjoys exercise - he told staff he doesnt want to graduate !  H eplasn to  go to the [place] after graduation.  Patrick Wilson is doing well in rehab.  He will be graduating next week!  He improved his post 6MWT by 275 ft!!    Expected Outcomes  Short: get BP under control Long: get back to rehab classes  Short: Continue to exericse on off days.  Long: Continue to be independent in exercise.  Short:  complete HT program Long: maintain exercise on his own  Short; Graduate  Long: Continue to attend the [place] & Weight - Outcomes: Pre Biometrics - 04/02/19 1510      Pre Biometrics   Height  '5\' 8"'$  (1.727 m)    Weight  175 lb 3.2 oz (79.5 kg)    BMI (Calculated)  26.65    Single Leg Stand  10.31 seconds      Post Biometrics - 08/21/19 1349       Post  Biometrics   Height  '5\' 8"'$  (1.727 m)    Weight  171 lb 4.8 oz (77.7 kg)    BMI (Calculated)  26.05       Nutrition: Nutrition Therapy & Goals - 04/04/19 1003      Nutrition Therapy   Diet  Low Na, HH diet    Protein (specify units)  65g    Fiber  30 grams    Whole Grain Foods  3 servings    Saturated Fats  12 max. grams    Fruits and Vegetables  5 servings/day    Sodium  1.5 grams      Personal Nutrition Goals   Nutrition Goal  ST: reduce Na LT: UBW 175#, wants to get down to 165# - wants to gain strength    Comments  Pt reports not eating properly, but is trying. Pt wants to build skills. B: 2 cups coffee with danish or cereal like raisin bran with whole milk and banana. Drinks water during the day. L:meatloaf, stewed apples with pintos or corn and okra etc. at [employer] (has this the 3 days coming to rehab) and frozen pizza (half) and boiled eggs w/ turnip greens and corned bread and meatloaf with baked beans/ peas and frozen vegetables with meat. Pt reports loving vegetables. S: small box of raisins most days, ritz crackers with jiff peanut butter with a glass of whole milk (1-2x/week). Pt reports loving his families homemade chocolate pie. Will sometimes have a can of tuna (bread and butter  pickle juice) with some ritz crackers. Pt reports sodium is a problem for him. Pt also reports likes [place] stew. Discussed general Al[place] eating. Pt would like to also work on skill goals, pt reports mild ST memory issues, discussed giving him handouts to keep for review and providing him with youtube skills videos (reports having access to computer).      Intervention Plan   Intervention  Nutrition handout(s) given to patient.;Prescribe, educate and counsel regarding individualized specific dietary modifications aiming towards targeted core components such as weight, hypertension, lipid management, diabetes, heart failure and other comorbidities.    Expected Outcomes  Short Term Goal: Understand basic principles of dietary content, such as calories, fat, sodium, cholesterol and nutrients.;Short Term Goal: A plan has been developed with personal nutrition goals set during dietitian appointment.;Long Term Goal: Adherence to prescribed nutrition plan.       Nutrition Discharge:   Education Questionnaire Score: Knowledge Questionnaire Score - 04/02/19 1514      Knowledge Questionnaire Score   Pre Score  19/26 nutrition       Goals reviewed with patient; copy given to  patient.

## 2019-09-02 ENCOUNTER — Other Ambulatory Visit: Payer: Self-pay

## 2019-09-02 ENCOUNTER — Encounter: Payer: Medicare HMO | Attending: Internal Medicine | Admitting: *Deleted

## 2019-09-02 DIAGNOSIS — Z85828 Personal history of other malignant neoplasm of skin: Secondary | ICD-10-CM | POA: Insufficient documentation

## 2019-09-02 DIAGNOSIS — I2510 Atherosclerotic heart disease of native coronary artery without angina pectoris: Secondary | ICD-10-CM | POA: Diagnosis not present

## 2019-09-02 DIAGNOSIS — Z7982 Long term (current) use of aspirin: Secondary | ICD-10-CM | POA: Diagnosis not present

## 2019-09-02 DIAGNOSIS — K219 Gastro-esophageal reflux disease without esophagitis: Secondary | ICD-10-CM | POA: Diagnosis not present

## 2019-09-02 DIAGNOSIS — F329 Major depressive disorder, single episode, unspecified: Secondary | ICD-10-CM | POA: Insufficient documentation

## 2019-09-02 DIAGNOSIS — I10 Essential (primary) hypertension: Secondary | ICD-10-CM | POA: Insufficient documentation

## 2019-09-02 DIAGNOSIS — I213 ST elevation (STEMI) myocardial infarction of unspecified site: Secondary | ICD-10-CM | POA: Insufficient documentation

## 2019-09-02 DIAGNOSIS — I252 Old myocardial infarction: Secondary | ICD-10-CM | POA: Diagnosis not present

## 2019-09-02 DIAGNOSIS — Z791 Long term (current) use of non-steroidal anti-inflammatories (NSAID): Secondary | ICD-10-CM | POA: Diagnosis not present

## 2019-09-02 DIAGNOSIS — Z79899 Other long term (current) drug therapy: Secondary | ICD-10-CM | POA: Insufficient documentation

## 2019-09-02 NOTE — Progress Notes (Signed)
Daily Session Note  Patient Details  Name: Patrick Wilson MRN: [id number] Date of Birth: 06/16/1933 Referring Provider:     Cardiac Rehab from 04/02/2019 in [facility]  Referring Provider  [name]      Encounter Date: 09/02/2019  Check In: Session Check In - 09/02/19 1311      Check-In   Supervising physician immediately available to respond to emergencies  See telemetry face sheet for immediately available ER MD    Location  [facility]-Cardiac & Pulmonary Rehab    Staff Present  Renita Papa, RN BSN;Joseph Hood RCP,RRT,BSRT;Melissa Sanborn RDN, LDN    Virtual Visit  No    Medication changes reported      No    Fall or balance concerns reported     No    Warm-up and Cool-down  Performed on first and last piece of equipment    Resistance Training Performed  Yes    VAD Patient?  No    PAD/SET Patient?  No      Pain Assessment   Currently in Pain?  No/denies          Social History   Tobacco Use  Smoking Status Never Smoker  Smokeless Tobacco Current User  . Types: Chew  Tobacco Comment   May be ready to quit.    Goals Met:  Independence with exercise equipment Exercise tolerated well No report of cardiac concerns or symptoms Strength training completed today  Goals Unmet:  Not Applicable  Comments:  Aric graduated today from  rehab with 36 sessions completed.  Details of the patient's exercise prescription and what He needs to do in order to continue the prescription and progress were discussed with patient.  Patient was given a copy of prescription and goals.  Patient verbalized understanding.  Jakhi plans to continue to exercise by attending the [facility] and walking.    Dr. Emily Filbert is Medical Director for [facility].

## 2019-09-02 NOTE — Progress Notes (Signed)
Cardiac Individual Treatment Plan  Patient Details  Name: Patrick Wilson MRN: [id number] Date of Birth: [birth year] Referring Provider:     Cardiac Rehab from 04/02/2019 in [facility] Cardiac and Pulmonary Rehab  Referring Provider  Sabra Heck [Paras[place]]      Initial Encounter Date:    Cardiac Rehab from 04/02/2019 in [facility] Cardiac and Pulmonary Rehab  Date  04/02/19      Visit Diagnosis: ST elevation myocardial infarction (STEMI), unspecified artery ([place])  Patient's Home Medications on Admission:  Current Outpatient Medications:  .  ALPRAZolam (XANAX) 0.25 MG tablet, Take 0.25 mg by mouth 2 (two) times a day., Disp: , Rfl:  .  aspirin EC 81 MG tablet, Take 81 mg by mouth daily., Disp: , Rfl:  .  atorvastatin (LIPITOR) 80 MG tablet, Take 1 tablet (80 mg total) by mouth at bedtime., Disp: 30 tablet, Rfl: 1 .  atorvastatin (LIPITOR) 80 MG tablet, Take by mouth., Disp: , Rfl:  .  B Complex Vitamins (VITAMIN B COMPLEX PO), Take 1 tablet by mouth daily. , Disp: , Rfl:  .  Denture Care Products (DENTURE ADHESIVE) CREA, Apply 1 application topically daily as needed. , Disp: , Rfl:  .  diphenhydrAMINE (EQ ALLERGY) 25 mg capsule, Take 25 mg by mouth every 6 (six) hours as needed for allergies., Disp: , Rfl:  .  docusate sodium (COLACE) 100 MG capsule, Take 1 capsule (100 mg total) by mouth 2 (two) times daily. (Patient not taking: Reported on 04/01/2019), Disp: 60 capsule, Rfl: 0 .  donepezil (ARICEPT) 10 MG tablet, Take by mouth., Disp: , Rfl:  .  lisinopril (ZESTRIL) 2.5 MG tablet, Take 1 tablet (2.5 mg total) by mouth daily., Disp: 30 tablet, Rfl: 1 .  magnesium gluconate (MAGONATE) 500 MG tablet, Take 500 mg by mouth daily., Disp: , Rfl:  .  meloxicam (MOBIC) 15 MG tablet, Take 15 mg by mouth at bedtime., Disp: , Rfl:  .  metoCLOPramide (REGLAN) 5 MG tablet, Take 5 mg by mouth 2 (two) times daily., Disp: , Rfl:  .  metoprolol tartrate (LOPRESSOR) 25 MG tablet, Take 0.5 tablets (12.5 mg total) by mouth  every 12 (twelve) hours., Disp: 30 tablet, Rfl: 1 .  Multiple Vitamins-Minerals (BL CENTURY SENIOR PO), Take 1 tablet by mouth daily., Disp: , Rfl:  .  nitroGLYCERIN (NITROSTAT) 0.4 MG SL tablet, Place 0.4 mg under the tongue every 5 (five) minutes as needed for chest pain. Reported on 11/13/2015, Disp: , Rfl:  .  olmesartan (BENICAR) 20 MG tablet, Take by mouth., Disp: , Rfl:  .  Omega-3 Fatty Acids (FISH OIL) 1000 MG CAPS, Take 1,000 mg by mouth daily., Disp: , Rfl:  .  omeprazole (PRILOSEC) 40 MG capsule, Take 40 mg by mouth 2 (two) times daily., Disp: , Rfl:  .  sucralfate (CARAFATE) 1 g tablet, Take 1 g by mouth 4 (four) times daily., Disp: , Rfl:  .  tamsulosin (FLOMAX) 0.4 MG CAPS capsule, Take 1 capsule (0.4 mg total) by mouth daily., Disp: 30 capsule, Rfl: 0 .  ticagrelor (BRILINTA) 90 MG TABS tablet, Take by mouth., Disp: , Rfl:  .  traZODone (DESYREL) 100 MG tablet, Take 100 mg by mouth at bedtime., Disp: , Rfl:  .  venlafaxine XR (EFFEXOR-XR) 37.5 MG 24 hr capsule, Take 37.5 mg by mouth daily., Disp: , Rfl: 3  Past Medical History: Past Medical History:  Diagnosis Date  . Cancer ([place])    SKIN  . Coronary artery disease   .  Depression   . GERD (gastroesophageal reflux disease)   . History of kidney stones   . Hypertension   . Myocardial infarction ([place])    2012    Tobacco Use: Social History   Tobacco Use  Smoking Status Never Smoker  Smokeless Tobacco Current User  . Types: Chew  Tobacco Comment   May be ready to quit.    Labs: Recent Review Flowsheet Data    Labs for ITP Cardiac and Pulmonary Rehab Latest Ref Rng & Units 10/17/2018 10/18/2018   Cholestrol 0 - 200 mg/dL 138 -   LDLCALC 0 - 99 mg/dL 54 -   HDL >40 mg/dL 64 -   Trlycerides <150 mg/dL 101 -   Hemoglobin A1c 4.8 - 5.6 % - 5.5       Exercise Target Goals: Exercise Program Goal: Individual exercise prescription set using results from initial 6 min walk test and THRR while considering  patient's  activity barriers and safety.   Exercise Prescription Goal: Initial exercise prescription builds to 30-45 minutes a day of aerobic activity, 2-3 days per week.  Home exercise guidelines will be given to patient during program as part of exercise prescription that the participant will acknowledge.   Education: Aerobic Exercise & Resistance Training: - Gives group verbal and written instruction on the various components of exercise. Focuses on aerobic and resistive training programs and the benefits of this training and how to safely progress through these programs..   Education: Exercise & Equipment Safety: - Individual verbal instruction and demonstration of equipment use and safety with use of the equipment.   Cardiac Rehab from 04/04/2019 in [facility] Cardiac and Pulmonary Rehab  Date  04/02/19  Educator  AS  Instruction Review Code  1- Verbalizes Understanding      Education: Exercise Physiology & General Exercise Guidelines: - Group verbal and written instruction with models to review the exercise physiology of the cardiovascular system and associated critical values. Provides general exercise guidelines with specific guidelines to those with heart or lung disease.    Education: Flexibility, Balance, Mind/Body Relaxation: Provides group verbal/written instruction on the benefits of flexibility and balance training, including mind/body exercise modes such as yoga, pilates and tai chi.  Demonstration and skill practice provided.   Cardiac Rehab from 04/04/2019 in [facility] Cardiac and Pulmonary Rehab  Date  04/04/19  Educator  AS  Instruction Review Code  1- Verbalizes Understanding      Activity Barriers & Risk Stratification: Activity Barriers & Cardiac Risk Stratification - 04/01/19 1222      Activity Barriers & Cardiac Risk Stratification   Activity Barriers  None    Cardiac Risk Stratification  High       6 Minute Walk: 6 Minute Walk    Row Name 04/02/19 1458 08/21/19 1348        6 Minute Walk   Phase  Initial  Discharge    Distance  1600 feet  1875 feet    Distance % Change  --  17.2 %    Distance Feet Change  --  275 ft    Walk Time  6 minutes  6 minutes    # of Rest Breaks  0  0    MPH  3.03  3.55    METS  2.98  3.29    RPE  12  15    Perceived Dyspnea   1  --    VO2 Peak  10.44  11.53    Symptoms  No  No  Resting HR  65 bpm  65 bpm    Resting BP  134/68  132/74    Resting Oxygen Saturation   97 %  --    Exercise Oxygen Saturation  during 6 min walk  95 %  --    Max Ex. HR  2.31 bpm  95 bpm    Max Ex. BP  148/64  138/76    2 Minute Post BP  138/74  --       Oxygen Initial Assessment:   Oxygen Re-Evaluation:   Oxygen Discharge (Final Oxygen Re-Evaluation):   Initial Exercise Prescription: Initial Exercise Prescription - 04/02/19 1500      Date of Initial Exercise RX and Referring Provider   Date  04/02/19    Referring Provider  [name]     Treadmill   MPH  2.7    Grade  0    Minutes  15    METs  3.07      Recumbant Bike   Level  3    RPM  60    Watts  25    Minutes  15    METs  3      Elliptical   Level  1    Speed  3    Minutes  15      REL-XR   Level  3    Speed  50    Minutes  15    METs  3      T5 Nustep   Level  2    SPM  80    Minutes  15    METs  3      Biostep-RELP   Level  3    SPM  50    Minutes  15    METs  3      Prescription Details   Frequency (times per week)  3    Duration  Progress to 30 minutes of continuous aerobic without signs/symptoms of physical distress      Intensity   THRR 40-80% of Max Heartrate  94-122    Ratings of Perceived Exertion  11-15    Perceived Dyspnea  0-4      Resistance Training   Training Prescription  Yes    Weight  4 lb    Reps  10-15       Perform Capillary Blood Glucose checks as needed.  Exercise Prescription Changes: Exercise Prescription Changes    Row Name 04/02/19 1500 04/10/19 1100 04/23/19 1300 05/10/19 0900 06/18/19 1200      Response to Exercise   Blood Pressure (Admit)  134/68  130/70  142/70  110/60  126/70   Blood Pressure (Exercise)  148/64  140/82  158/62  122/66  158/82   Blood Pressure (Exit)  138/74  140/80  100/60  108/48  102/60   Heart Rate (Admit)  65 bpm  66 bpm  63 bpm  64 bpm  55 bpm   Heart Rate (Exercise)  107 bpm  90 bpm  114 bpm  77 bpm  103 bpm   Heart Rate (Exit)  76 bpm  76 bpm  81 bpm  61 bpm  79 bpm   Oxygen Saturation (Admit)  95 %  --  --  --  --   Oxygen Saturation (Exercise)  97 %  --  --  --  --   Rating of Perceived Exertion (Exercise)  '12  15  15  15  '$ 17  Perceived Dyspnea (Exercise)  1  --  --  --  --   Symptoms  none  none  none  none  noone   Comments  walk test  third full day of exercise  --  --  --   Duration  --  Continue with 30 min of aerobic exercise without signs/symptoms of physical distress.  Continue with 30 min of aerobic exercise without signs/symptoms of physical distress.  Continue with 30 min of aerobic exercise without signs/symptoms of physical distress.  Continue with 30 min of aerobic exercise without signs/symptoms of physical distress.   Intensity  --  THRR unchanged  THRR unchanged  THRR unchanged  THRR unchanged     Progression   Progression  --  Continue to progress workloads to maintain intensity without signs/symptoms of physical distress.  Continue to progress workloads to maintain intensity without signs/symptoms of physical distress.  Continue to progress workloads to maintain intensity without signs/symptoms of physical distress.  Continue to progress workloads to maintain intensity without signs/symptoms of physical distress.   Average METs  --  3.63  3.5  3.08  3.07     Resistance Training   Training Prescription  --  Yes  Yes  Yes  Yes   Weight  --  4 lb   5 lb   5 lb   5lb   Reps  --  10-15  10-15  10-15  10-15     Interval Training   Interval Training  --  No  No  No  No     Treadmill   MPH  --  2.3  2.7  2.7  2.7   Grade  --  0  0  0  0    Minutes  --  '15  15  15  15   '$ METs  --  2.76  3.07  3.07  3.07     Recumbant Bike   Level  --  3  --  4  --   Watts  --  40  --  29  --   Minutes  --  15  --  15  --   METs  --  3.57  --  3.17  --     Elliptical   Level  --  --  '1  1  1   '$ Speed  --  --  '3  3  3   '$ Minutes  --  --  15 2-3 at a time the rest  15  15     T5 Nustep   Level  --  2  --  --  --   Minutes  --  15  --  --  --   METs  --  3.2  --  --  --     Biostep-RELP   Level  --  3  --  3  --   Minutes  --  15  --  15  --   METs  --  5  --  3  --     Home Exercise Plan   Plans to continue exercise at  --  --  --  Home (comment) walking, return to [facility] (comment) walking, return to [facility]   Frequency  --  --  --  Add 3 additional days to program exercise sessions.  Add 3 additional days to program exercise sessions.   Initial Home Exercises Provided  --  --  --  04/15/19  04/15/19  Row Name 07/03/19 1200 07/16/19 1500 07/29/19 1600 08/15/19 1200 08/27/19 1500     Response to Exercise   Blood Pressure (Admit)  120/58  130/60  112/70  132/60  132/60   Blood Pressure (Exercise)  114/46  142/58  128/74  128/58  146/62   Blood Pressure (Exit)  112/60  94/60  110/62  130/70  100/60   Heart Rate (Admit)  66 bpm  79 bpm  69 bpm  54 bpm  65 bpm   Heart Rate (Exercise)  98 bpm  95 bpm  103 bpm  107 bpm  104 bpm   Heart Rate (Exit)  98 bpm  64 bpm  82 bpm  75 bpm  68 bpm   Rating of Perceived Exertion (Exercise)  '15  17  15  14  19   '$ Symptoms  none  none  none  none  none   Duration  Continue with 30 min of aerobic exercise without signs/symptoms of physical distress.  Continue with 30 min of aerobic exercise without signs/symptoms of physical distress.  Continue with 30 min of aerobic exercise without signs/symptoms of physical distress.  Continue with 30 min of aerobic exercise without signs/symptoms of physical distress.  Continue with 30 min of aerobic exercise without signs/symptoms of physical distress.    Intensity  THRR unchanged  THRR unchanged  THRR unchanged  THRR unchanged  THRR unchanged     Progression   Progression  Continue to progress workloads to maintain intensity without signs/symptoms of physical distress.  Continue to progress workloads to maintain intensity without signs/symptoms of physical distress.  Continue to progress workloads to maintain intensity without signs/symptoms of physical distress.  Continue to progress workloads to maintain intensity without signs/symptoms of physical distress.  Continue to progress workloads to maintain intensity without signs/symptoms of physical distress.   Average METs  3.1  3.07  2.94  3.9  3.05     Resistance Training   Training Prescription  Yes  Yes  Yes  Yes  Yes   Weight   5lb  5 lb  5 lb  5 lb  5 lb   Reps  10-15  10-15  10-15  10-15  10-15     Interval Training   Interval Training  No  No  No  No  No     Treadmill   MPH  2.7  2.7  2.7  --  1.8   Grade  0  0  0  --  2   Minutes  '15  15  15  '$ --  15   METs  3.07  3.07  3.07  --  2.87     Recumbant Bike   Level  --  --  5  --  6   Watts  --  --  26  --  43   Minutes  --  --  15  --  15   METs  --  --  2.98  --  3.89     NuStep   Level  4  --  --  4  --   SPM  --  --  --  50  --   Minutes  15  --  --  15  --   METs  3.1  --  --  4.8  --     Arm Ergometer   Level  --  --  1  --  1   Minutes  --  --  15  --  15   METs  --  --  2  --  2.4     Elliptical   Level  --  1  --  --  1   Speed  --  3  --  --  3   Minutes  --  15  --  --  15     REL-XR   Level  --  --  5  --  --   Minutes  --  --  15  --  --   METs  --  --  3.7  --  --     Biostep-RELP   Level  --  --  --  3  --   SPM  --  --  --  50  --   Minutes  --  --  --  15  --   METs  --  --  --  3  --     Home Exercise Plan   Plans to continue exercise at  Home (comment) walking, return to [place] (comment) walking, return to [facility]  --  Home (comment) walking, return to [place]  3 additional days to program exercise sessions.  --  Add 3 additional days to program exercise sessions.  --  Add 3 additional days to program exercise sessions.   Initial Home Exercises Provided  04/15/19  --  04/15/19  --  04/15/19      Exercise Comments:   Exercise Goals and Review: Exercise Goals    Row Name 04/02/19 1510             Exercise Goals   Increase Physical Activity  Yes       Intervention  Provide advice, education, support and counseling about physical activity/exercise needs.;Develop an individualized exercise prescription for aerobic and resistive training based on initial evaluation findings, risk stratification, comorbidities and participant's personal goals.       Expected Outcomes  Short Term: Attend rehab on a regular basis to increase amount of physical activity.;Long Term: Add in home exercise to make exercise part of routine and to increase amount of physical activity.;Long Term: Exercising regularly at least 3-5 days a week.       Increase Strength and Stamina  Yes       Intervention  Provide advice, education, support and counseling about physical activity/exercise needs.;Develop an individualized exercise prescription for aerobic and resistive training based on initial evaluation findings, risk stratification, comorbidities and participant's personal goals.       Expected Outcomes  Short Term: Increase workloads from initial exercise prescription for resistance, speed, and METs.;Short Term: Perform resistance training exercises routinely during rehab and add in resistance training at home;Long Term: Improve cardiorespiratory fitness, muscular endurance and strength as measured by increased METs and functional capacity (6MWT)       Able to understand and use rate of perceived exertion (RPE) scale  Yes       Intervention  Provide education and explanation on how to use RPE scale       Expected Outcomes  Short Term: Able to use RPE daily in rehab to express  subjective intensity level;Long Term:  Able to use RPE to guide intensity level when exercising independently       Knowledge and understanding of Target Heart Rate Range (THRR)  Yes       Intervention  Provide education and explanation of THRR including how the numbers were predicted and where they  are located for reference       Expected Outcomes  Short Term: Able to state/look up THRR;Short Term: Able to use daily as guideline for intensity in rehab;Long Term: Able to use THRR to govern intensity when exercising independently       Able to check pulse independently  Yes       Intervention  Provide education and demonstration on how to check pulse in carotid and radial arteries.;Review the importance of being able to check your own pulse for safety during independent exercise       Expected Outcomes  Short Term: Able to explain why pulse checking is important during independent exercise;Long Term: Able to check pulse independently and accurately       Understanding of Exercise Prescription  Yes       Intervention  Provide education, explanation, and written materials on patient's individual exercise prescription       Expected Outcomes  Short Term: Able to explain program exercise prescription;Long Term: Able to explain home exercise prescription to exercise independently          Exercise Goals Re-Evaluation : Exercise Goals Re-Evaluation    Row Name 04/03/19 1424 04/10/19 1131 04/15/19 1456 04/23/19 1318 05/10/19 0936     Exercise Goal Re-Evaluation   Exercise Goals Review  Increase Physical Activity;Able to understand and use rate of perceived exertion (RPE) scale;Knowledge and understanding of Target Heart Rate Range (THRR);Understanding of Exercise Prescription;Increase Strength and Stamina;Able to check pulse independently  Increase Physical Activity;Increase Strength and Stamina;Understanding of Exercise Prescription  Increase Physical Activity;Increase Strength and Stamina;Understanding  of Exercise Prescription;Able to understand and use rate of perceived exertion (RPE) scale;Able to understand and use Dyspnea scale;Knowledge and understanding of Target Heart Rate Range (THRR);Able to check pulse independently  Increase Physical Activity;Increase Strength and Stamina;Able to understand and use rate of perceived exertion (RPE) scale;Knowledge and understanding of Target Heart Rate Range (THRR);Able to check pulse independently;Understanding of Exercise Prescription  Increase Physical Activity;Increase Strength and Stamina;Understanding of Exercise Prescription   Comments  Reviewed RPE scale, THR and program prescription with pt today.  Pt voiced understanding and was given a copy of goals to take home.  Dodd is off to a good start in rehab.  He is doing 2.3 mph on the treadmill as the 2.7 mph felt a little fast.  We will continue to montior his progress.  Andree is doing well in rehab.  He has already started to do some stretching and walking at home. Reviewed home exercise with pt today.  Pt plans to walk and use staff videos for exercise.  Reviewed THR, pulse, RPE, sign and symptoms, and when to call 911 or MD.  Also discussed weather considerations and indoor options.  Pt voiced understanding.  Autry works in [employer] and RPE range.  He has increased to 5 lb weights.  Staff will monitor progress.  Tereso has been doing well in rehab.  He tried the ellipitcal just before Christmas and found it challenging.  He is also up to level 4 on the bike.  We will continue to monitor his progress.   Expected Outcomes  Short: Use RPE daily to regulate intensity. Long: Follow program prescription in THR.  Short: Continue to attend regularly.  Long: Continue to follow program prescritpion  Short: Continue to walk at home on off days.  Long: Continue to improve stamina.  Short - continue to exercise conistently Long improve overall stamina  Short: Continue to exercise on off days.  Long: Continue to improve stamina  to return to [place].   [place] Name 05/20/19 1330 06/18/19 1228 06/21/19 1158 07/03/19 1207 07/08/19 1325     Exercise Goal Re-Evaluation   Exercise Goals Review  Increase Physical Activity;Increase Strength and Stamina;Understanding of Exercise Prescription  Increase Physical Activity;Increase Strength and Stamina;Able to understand and use rate of perceived exertion (RPE) scale;Knowledge and understanding of Target Heart Rate Range (THRR);Able to check pulse independently;Understanding of Exercise Prescription;Able to understand and use Dyspnea scale  Increase Physical Activity;Increase Strength and Stamina;Understanding of Exercise Prescription  Increase Physical Activity;Increase Strength and Stamina;Understanding of Exercise Prescription  Increase Physical Activity;Increase Strength and Stamina;Understanding of Exercise Prescription   Comments  Estefan reported that he is exercising about every day. He is walking, using a stepper, using weights, and stretching. He stated that he is very committed to his cardiac rehab program and is anxious to get back to coming to [facility] 3 days a week. He stated his commitment to continue with home exercise until the program re-opens fully.  Render has been trying the elliptical during classes.  It is challenging for him but he is willing to work on increasing time.  Robey has been doing well with his exercise at home.  He is using his stepper and weights daily. He missed coming to class yesterday as it is also his social outlet.  Britney is doing well in rehab.  He is up to 3.1 METs on the NuStep.  We will conitnue to montiro his progress.  Konstantin is doing well in rehab.  He is up to 3.1 METs on the NuStep.  We will conitnue to montiro his progress. Pt reports staying active at home. He uses a stepper at home: will do it intermittently such as during commercials. He will also walk up and down the hallway and walking to mailbox. He is doing 15 reps with his weights now.   Expected  Outcomes  Short: continue to do home exercise until cardiac rehab fully re-opens, and then come back to exercise in [place] 3 days per week. Long: continue to improve stamina.  Short : work up to 15 min on elliptical without stopping Long :  build stamina  Short: Continue to exercise on off days.  Long: Continue to build stamina.  Short: Add incline to treadmill.  Long: Continue to improve stamina.  Short: Add incline to treadmill.  Long: Continue to improve stamina.   [place] Name 07/16/19 1549 07/29/19 1317 08/15/19 1248 08/27/19 1539       Exercise Goal Re-Evaluation   Exercise Goals Review  Increase Physical Activity;Increase Strength and Stamina;Able to understand and use rate of perceived exertion (RPE) scale;Able to understand and use Dyspnea scale;Knowledge and understanding of Target Heart Rate Range (THRR);Able to check pulse independently;Understanding of Exercise Prescription  Increase Physical Activity;Increase Strength and Stamina;Understanding of Exercise Prescription  Increase Physical Activity;Increase Strength and Stamina;Able to understand and use rate of perceived exertion (RPE) scale;Able to understand and use Dyspnea scale;Knowledge and understanding of Target Heart Rate Range (THRR);Able to check pulse independently;Understanding of Exercise Prescription  Increase Physical Activity;Increase Strength and Stamina;Understanding of Exercise Prescription    Comments  Giovan has missed sessions due to some issues with BP.  He will return when his Dr clears him.  Cashton is doing well in rehab. He is sore after exercise from getting in a good workout.  He feels that he is getting in better shape and enjoys coming to class.  He is walking at home  on his off days.  Eydan enjoys exercise - he told staff he doesnt want to graduate !  H eplasn to go to the [employer] after graduation.  Rodricus is doing well in rehab.  He will be graduating next week!  He improved his post 6MWT by 275 ft!!    Expected Outcomes   Short: get BP under control Long: get back to rehab classes  Short: Continue to exericse on off days.  Long: Continue to be independent in exercise.  Short:  complete HT program Long: maintain exercise on his own  Short; Graduate  Long: Continue to attend the [facility]       Discharge Exercise Prescription (Final Exercise Prescription Changes): Exercise Prescription Changes - 08/27/19 1500      Response to Exercise   Blood Pressure (Admit)  132/60    Blood Pressure (Exercise)  146/62    Blood Pressure (Exit)  100/60    Heart Rate (Admit)  65 bpm    Heart Rate (Exercise)  104 bpm    Heart Rate (Exit)  68 bpm    Rating of Perceived Exertion (Exercise)  19    Symptoms  none    Duration  Continue with 30 min of aerobic exercise without signs/symptoms of physical distress.    Intensity  THRR unchanged      Progression   Progression  Continue to progress workloads to maintain intensity without signs/symptoms of physical distress.    Average METs  3.05      Resistance Training   Training Prescription  Yes    Weight  5 lb    Reps  10-15      Interval Training   Interval Training  No      Treadmill   MPH  1.8    Grade  2    Minutes  15    METs  2.87      Recumbant Bike   Level  6    Watts  43    Minutes  15    METs  3.89      Arm Ergometer   Level  1    Minutes  15    METs  2.4      Elliptical   Level  1    Speed  3    Minutes  15      Home Exercise Plan   Plans to continue exercise at  Home (comment)   walking, return to [place] 3 additional days to program exercise sessions.    Initial Home Exercises Provided  04/15/19       Nutrition:  Target Goals: Understanding of nutrition guidelines, daily intake of sodium '1500mg'$ , cholesterol '200mg'$ , calories 30% from fat and 7% or less from saturated fats, daily to have 5 or more servings of fruits and vegetables.  Education: Controlling Sodium/Reading Food Labels -Group verbal and written material  supporting the discussion of sodium use in heart healthy nutrition. Review and explanation with models, verbal and written materials for utilization of the food label.   Education: General Nutrition Guidelines/Fats and Fiber: -Group instruction provided by verbal, written material, models and posters to present the general guidelines for heart healthy nutrition. Gives an explanation and review of dietary fats and fiber.   Biometrics: Pre Biometrics - 04/02/19 1510      Pre Biometrics   Height  '5\' 8"'$  (1.727 m)    Weight  175 lb 3.2 oz (79.5 kg)    BMI (Calculated)  26.65    Single Leg Stand  10.31 seconds      Post Biometrics - 08/21/19 1349       Post  Biometrics   Height  '5\' 8"'$  (1.727 m)    Weight  171 lb 4.8 oz (77.7 kg)    BMI (Calculated)  26.05       Nutrition Therapy Plan and Nutrition Goals: Nutrition Therapy & Goals - 04/04/19 1003      Nutrition Therapy   Diet  Low Na, HH diet    Protein (specify units)  65g    Fiber  30 grams    Whole Grain Foods  3 servings    Saturated Fats  12 max. grams    Fruits and Vegetables  5 servings/day    Sodium  1.5 grams      Personal Nutrition Goals   Nutrition Goal  ST: reduce Na LT: UBW 175#, wants to get down to 165# - wants to gain strength    Comments  Pt reports not eating properly, but is trying. Pt wants to build skills. B: 2 cups coffee with danish or cereal like raisin bran with whole milk and banana. Drinks water during the day. L:meatloaf, stewed apples with pintos or corn and okra etc. at [employer] (has this the 3 days coming to rehab) and frozen pizza (half) and boiled eggs w/ turnip greens and corned bread and meatloaf with baked beans/ peas and frozen vegetables with meat. Pt reports loving vegetables. S: small box of raisins most days, ritz crackers with jiff peanut butter with a glass of whole milk (1-2x/week). Pt reports loving his families homemade chocolate pie. Will sometimes have a can of tuna (bread and  butter pickle juice) with some ritz crackers. Pt reports sodium is a problem for him. Pt also reports likes [place] stew. Discussed general [place] eating. Pt would like to also work on skill goals, pt reports mild ST memory issues, discussed giving him handouts to keep for review and providing him with youtube skills videos (reports having access to computer).      Intervention Plan   Intervention  Nutrition handout(s) given to patient.;Prescribe, educate and counsel regarding individualized specific dietary modifications aiming towards targeted core components such as weight, hypertension, lipid management, diabetes, heart failure and other comorbidities.    Expected Outcomes  Short Term Goal: Understand basic principles of dietary content, such as calories, fat, sodium, cholesterol and nutrients.;Short Term Goal: A plan has been developed with personal nutrition goals set during dietitian appointment.;Long Term Goal: Adherence to prescribed nutrition plan.       Nutrition Assessments:   MEDIFICTS Score Key:          ?70 Need to make dietary changes          40-70 Heart Healthy Diet         ? 40 Therapeutic Level Cholesterol Diet  Nutrition Goals Re-Evaluation: Nutrition Goals Re-Evaluation    [place] Name 06/25/19 0750 07/08/19 1333 07/29/19 1331         Goals   Nutrition Goal  ST: reduce Na LT: UBW 175#, wants to get down to 165# - wants to gain strength  ST: rinse off beans (low sodium) LT: UBW 175#, wants to get down to 165# - wants to gain strength  ST: rinse off beans (low sodium) LT: UBW 175#, wants to get down to 165# - wants to gain strength     Comment  Continue with current changes  ST: rinse off  beans (low sodium). discussed lower sodium.  continue with current changes. Pt reports blood work and exam came back normal.     Expected Outcome  ST: reduce Na LT: UBW 175#, wants to get down to 165# - wants to gain strength  ST: reduce Na LT: UBW 175#, wants to get down to 165# - wants to  gain strength  ST: rinse off beans (low sodium) LT: UBW 175#, wants to get down to 165# - wants to gain strength        Nutrition Goals Discharge (Final Nutrition Goals Re-Evaluation): Nutrition Goals Re-Evaluation - 07/29/19 1331      Goals   Nutrition Goal  ST: rinse off beans (low sodium) LT: UBW 175#, wants to get down to 165# - wants to gain strength    Comment  continue with current changes. Pt reports blood work and exam came back normal.    Expected Outcome  ST: rinse off beans (low sodium) LT: UBW 175#, wants to get down to 165# - wants to gain strength       Psychosocial: Target Goals: Acknowledge presence or absence of significant depression and/or stress, maximize coping skills, provide positive support system. Participant is able to verbalize types and ability to use techniques and skills needed for reducing stress and depression.   Education: Depression - Provides group verbal and written instruction on the correlation between heart/lung disease and depressed mood, treatment options, and the stigmas associated with seeking treatment.   Education: Sleep Hygiene -Provides group verbal and written instruction about how sleep can affect your health.  Define sleep hygiene, discuss sleep cycles and impact of sleep habits. Review good sleep hygiene tips.     Education: Stress and Anxiety: - Provides group verbal and written instruction about the health risks of elevated stress and causes of high stress.  Discuss the correlation between heart/lung disease and anxiety and treatment options. Review healthy ways to manage with stress and anxiety.    Initial Review & Psychosocial Screening: Initial Psych Review & Screening - 04/01/19 1223      Initial Review   Current issues with  None Identified    Comments  Wife passed away this 09/16/2018 She had been sick 5-6 years and [place] cared for her.      Family Dynamics   Good Support System?  Yes   Daughter lives above [place],  Son in [place], has niece lives up the street  He talks to [employer] all daily. Neighbors keep eye on [employer].     Barriers   Psychosocial barriers to participate in program  There are no identifiable barriers or psychosocial needs.;The patient should benefit from training in stress management and relaxation.      Screening Interventions   Interventions  Encouraged to exercise;Provide feedback about the scores to participant;To provide support and resources with identified psychosocial needs    Expected Outcomes  Short Term goal: Utilizing psychosocial counselor, staff and physician to assist with identification of specific Stressors or current issues interfering with healing process. Setting desired goal for each stressor or current issue identified.;Long Term Goal: Stressors or current issues are controlled or eliminated.;Short Term goal: Identification and review with participant of any Quality of Life or Depression concerns found by scoring the questionnaire.;Long Term goal: The participant improves quality of Life and PHQ9 Scores as seen by post scores and/or verbalization of changes       Quality of Life Scores:  Quality of Life - 04/02/19 1515  Quality of Life   Select  Quality of Life      Quality of Life Scores   Health/Function Pre  27.43 %    Socioeconomic Pre  30 %    Psych/Spiritual Pre  30 %    Family Pre  30 %    GLOBAL Pre  28.84 %      Scores of 19 and below usually indicate a poorer quality of life in these areas.  A difference of  2-3 points is a clinically meaningful difference.  A difference of 2-3 points in the total score of the Quality of Life Index has been associated with significant improvement in overall quality of life, self-image, physical symptoms, and general health in studies assessing change in quality of life.  PHQ-9: Recent Review Flowsheet Data    Depression screen [facility] 2/9 04/02/2019   Decreased Interest 0   Down, Depressed, Hopeless 0   PHQ - 2 Score 0    Altered sleeping 0   Tired, decreased energy 2   Trouble concentrating 0   Moving slowly or fidgety/restless 0   Suicidal thoughts 0   PHQ-9 Score 2   Difficult doing work/chores Not difficult at all     Interpretation of Total Score  Total Score Depression Severity:  1-4 = Minimal depression, 5-9 = Mild depression, 10-14 = Moderate depression, 15-19 = Moderately severe depression, 20-27 = Severe depression   Psychosocial Evaluation and Intervention: Psychosocial Evaluation - 04/01/19 1238      Psychosocial Evaluation & Interventions   Interventions  Encouraged to exercise with the program and follow exercise prescription    Comments  Crue has no barriers to attending the program. He is excited to get started and get out of the house.  His second wife passed away in Sep 16, 2022 this year. She had been sick and required his care for the past 5-6 years. He wants to lose some weight that he gained while he was not able to be active as he cared for his wife. He has a great support system, HIs children , a niece and his neighbors. He talks to them all daily. He gets around the neighborhood on his golfcart. He plans to attend the full program.    Expected Outcomes  STG: Oather attends sessions LTG: Ziaire is able to utilize his lifestyle modifications he ahs learned during the program.    Continue Psychosocial Services   Follow up required by staff       Psychosocial Re-Evaluation: Psychosocial Re-Evaluation    [place] Name 04/15/19 1451 05/20/19 1333 06/21/19 1200 07/29/19 1318       Psychosocial Re-Evaluation   Current issues with  Current Stress Concerns  Current Stress Concerns  Current Stress Concerns  None Identified    Comments  Cleophas is doing well in rehab. He is working on quitting chewing tobacco.  He really wants to build his energy back up to be able to get out to hike again. His son is coming to visit for Christmas week!!  He is looking forward to having him come visit.  He is taking  safety protocols for his son. Overall he is feeling good.  Since he has started he has really enjoyed getting to see people and getting the exercise in regularly. He is impressed with how much his energy levels are starting to recover.  Vuong reports no new stress concerns and states that he has a strong support system.  He is feeling quite frustrated right now.  The cable/phone  company has been giving him the run around.  He has been trying to get his TV and phone line working for almost three weeks.  They are now supposed to be coming on Monday afternoon.   He said he has spent almost 3 hours on the phone with them!  Exercise class is his social outlet and he misses it when he can't come.  He is doing well mentally.  He is looking forward to getting back to church fulltime after Easter.  He feels good overall. He is watching and keeping a close eye on his memory.    Expected Outcomes  Short: Enjoy visit with son.  Long: Continue to attend exercise regularly.  Short: Continue to rely on support system for mental health needs. Long: continue to exercise to help manage stress.  Short: Get phone lines fixed.  Long: Continue stay positive.  Short: Continue to maintain memory  Long: Continue to stay positive.    Interventions  Encouraged to attend Cardiac Rehabilitation for the exercise  --  Encouraged to attend Cardiac Rehabilitation for the exercise  Encouraged to attend Cardiac Rehabilitation for the exercise    Continue Psychosocial Services   Follow up required by staff  --  Follow up required by staff  --       Psychosocial Discharge (Final Psychosocial Re-Evaluation): Psychosocial Re-Evaluation - 07/29/19 1318      Psychosocial Re-Evaluation   Current issues with  None Identified    Comments  He is doing well mentally.  He is looking forward to getting back to church fulltime after Easter.  He feels good overall. He is watching and keeping a close eye on his memory.    Expected Outcomes  Short:  Continue to maintain memory  Long: Continue to stay positive.    Interventions  Encouraged to attend Cardiac Rehabilitation for the exercise       Vocational Rehabilitation: Provide vocational rehab assistance to qualifying candidates.   Vocational Rehab Evaluation & Intervention: Vocational Rehab - 04/01/19 1226      Initial Vocational Rehab Evaluation & Intervention   Assessment shows need for Vocational Rehabilitation  No       Education: Education Goals: Education classes will be provided on a variety of topics geared toward better understanding of heart health and risk factor modification. Participant will state understanding/return demonstration of topics presented as noted by education test scores.  Learning Barriers/Preferences: Learning Barriers/Preferences - 04/01/19 1226      Learning Barriers/Preferences   Learning Barriers  None    Learning Preferences  None       General Cardiac Education Topics:  AED/CPR: - Group verbal and written instruction with the use of models to demonstrate the basic use of the AED with the basic ABC's of resuscitation.   Anatomy & Physiology of the Heart: - Group verbal and written instruction and models provide basic cardiac anatomy and physiology, with the coronary electrical and arterial systems. Review of Valvular disease and Heart Failure   Cardiac Procedures: - Group verbal and written instruction to review commonly prescribed medications for heart disease. Reviews the medication, class of the drug, and side effects. Includes the steps to properly store meds and maintain the prescription regimen. (beta blockers and nitrates)   Cardiac Medications I: - Group verbal and written instruction to review commonly prescribed medications for heart disease. Reviews the medication, class of the drug, and side effects. Includes the steps to properly store meds and maintain the prescription regimen.   Cardiac  Medications II: -Group  verbal and written instruction to review commonly prescribed medications for heart disease. Reviews the medication, class of the drug, and side effects. (all other drug classes)    Go Sex-Intimacy & Heart Disease, Get SMART - Goal Setting: - Group verbal and written instruction through game format to discuss heart disease and the return to sexual intimacy. Provides group verbal and written material to discuss and apply goal setting through the application of the S.M.A.R.T. Method.   Other Matters of the Heart: - Provides group verbal, written materials and models to describe Stable Angina and Peripheral Artery. Includes description of the disease process and treatment options available to the cardiac patient.   Infection Prevention: - Provides verbal and written material to individual with discussion of infection control including proper hand washing and proper equipment cleaning during exercise session.   Cardiac Rehab from 04/04/2019 in [facility] Cardiac [facility] Pulmonary Rehab  Date  04/02/19  Educator  AS  Instruction Review Code  1- Verbalizes Understanding      Falls Prevention: - Provides verbal and written material to individual with discussion of falls prevention and safety.   Cardiac Rehab from 04/04/2019 in [facility] Cardiac and Pulmonary Rehab  Date  04/02/19  Educator  AS  Instruction Review Code  1- Verbalizes Understanding      Other: -Provides group and verbal instruction on various topics (see comments)   Knowledge Questionnaire Score: Knowledge Questionnaire Score - 04/02/19 1514      Knowledge Questionnaire Score   Pre Score  19/26 nutrition       Core Components/Risk Factors/Patient Goals at Admission: Personal Goals and Risk Factors at Admission - 04/02/19 1511      Core Components/Risk Factors/Patient Goals on Admission    Weight Management  Weight Maintenance;Yes    Intervention  Weight Management: Develop a combined nutrition and exercise program designed to  reach desired caloric intake, while maintaining appropriate intake of nutrient and fiber, sodium and fats, and appropriate energy expenditure required for the weight goal.;Weight Management: Provide education and appropriate resources to help participant work on and attain dietary goals.    Admit Weight  175 lb 3.2 oz (79.5 kg)    Expected Outcomes  Short Term: Continue to assess and modify interventions until short term weight is achieved;Long Term: Adherence to nutrition and physical activity/exercise program aimed toward attainment of established weight goal;Weight Maintenance: Understanding of the daily nutrition guidelines, which includes 25-35% calories from fat, 7% or less cal from saturated fats, less than '200mg'$  cholesterol, less than 1.5gm of sodium, & 5 or more servings of fruits and vegetables daily    Intervention  Assist the participant in steps to quit. Provide individualized education and counseling about committing to Tobacco Cessation, relapse prevention, and pharmacological support that can be provided by physician.;Advice worker, assist with locating and accessing local/national Quit Smoking programs, and support quit date choice.    Expected Outcomes  Short Term: Will demonstrate readiness to quit, by selecting a quit date.;Short Term: Will quit all tobacco product use, adhering to prevention of relapse plan.;Long Term: Complete abstinence from all tobacco products for at least 12 months from quit date.       Education:Diabetes - Individual verbal and written instruction to review signs/symptoms of diabetes, desired ranges of glucose level fasting, after meals and with exercise. Acknowledge that pre and post exercise glucose checks will be done for 3 sessions at entry of program.   Education: Know Your Numbers and Risk Factors: -Group  verbal and written instruction about important numbers in your health.  Discussion of what are risk factors and how they play a role in  the disease process.  Review of Cholesterol, Blood Pressure, Diabetes, and BMI and the role they play in your overall health.   Core Components/Risk Factors/Patient Goals Review:  Goals and Risk Factor Review    Row Name 04/15/19 1447 05/20/19 1332 06/21/19 1202 07/08/19 1327 07/29/19 1320     Core Components/Risk Factors/Patient Goals Review   Personal Goals Review  Weight Management/Obesity;Tobacco Cessation;Hypertension;Lipids  Weight Management/Obesity;Tobacco Cessation;Hypertension;Lipids  Weight Management/Obesity;Hypertension  Weight Management/Obesity;Hypertension  Weight Management/Obesity;Hypertension   Review  Rushawn is doing well in rehab.  He is already down to 165 lb at home!  He is pleased with his progress and wants to continue to build muscle and energy levels.  Blood pressures have been good and he does check them at home.  He notices when it runs high/low based on how he feels.  He is doing well on his medications.  He has cut back on chewing tobacco and is making strides towards quitting altogether.  He wants to go slowly but is mentally ready to quit.  Jami reports that he is taking his meds a prescribed by his doctor. He is maintaining a steady weight. Does not smoke, but still uses chewing tobacco.  His pressures have been doing good and his weight has been fairly steady. Surprisingly he is not gaining weight since he makes an afternoon out of his exercise session by going to [employer] after class for late lunch/early dinner.  His pressures have been doing good and his weight has been fairly steady.  Nyquan is doing well in rehab.  Blood pressure and weights have been doing good.  He is holding steady.  He is working on staying hydrating to help manage blood pressures.   Expected Outcomes  Short: Continue to work toward tobacco cessation.  Long: Continue to build stamina.  Short: Continue to work toward tobacco cessation. Long: Continue to build stamina.  Short: Continue to  monitor weight with his diet.  Long; Continue to montior risk factors.  Short: Continue to monitor weight with his diet.  Long; Continue to montior risk factors.  Short: Continue to keep eye on blood pressue.  Long: Continue to monitor risk factors.      Core Components/Risk Factors/Patient Goals at Discharge (Final Review):  Goals and Risk Factor Review - 07/29/19 1320      Core Components/Risk Factors/Patient Goals Review   Personal Goals Review  Weight Management/Obesity;Hypertension    Review  Mcgregor is doing well in rehab.  Blood pressure and weights have been doing good.  He is holding steady.  He is working on staying hydrating to help manage blood pressures.    Expected Outcomes  Short: Continue to keep eye on blood pressue.  Long: Continue to monitor risk factors.       ITP Comments: ITP Comments    Row Name 04/01/19 1256 04/02/19 1521 04/03/19 1423 04/04/19 1033 05/01/19 0844   ITP Comments  Virtual Orientation call completed. Has appointment tomorrow with Ep for Eval and Gym Orientation. Diagnosis documentation can be found in [facility] 10/17/2018.  6MWT completed and initial ITP created and sent to Dr Sabra Heck.  First full day of exercise!  Patient was oriented to gym and equipment including functions, settings, policies, and procedures.  Patient's individual exercise prescription and treatment plan were reviewed.  All starting workloads were established based on the results  of the 6 minute walk test done at initial orientation visit.  The plan for exercise progression was also introduced and progression will be customized based on patient's performance and goals  Completed Initial RD eval  30 day review competed . ITP sent to Dr Emily Filbert for review, changes as needed and ITP approval signature   [place] Name 05/06/19 1348 05/20/19 1334 05/22/19 1422 05/27/19 1424 05/29/19 1212   ITP Comments  Checked in with patient since he is not in person this week. Stated he was doing really well. He is  walking and doing exercises with weights. He reports that he is very thankful for all we have done and wants to keep working hard to feel better.  Virtual F/U call today:Brenon reported that he is exercising about every day. He is walking, using a stepper, using weights, and stretching. He stated that he is very committed to his cardiac rehab program and is anxious to get back to coming to [facility] 3 days a week. He stated his commitment to continue with home exercise until the program re-opens fully. Broghan reports that he is taking his meds a prescribed by his doctor. He is maintaining a steady weight. Does not smoke, but still uses chewing tobacco. Jousha reports no new stress concerns and states that he has a strong support system.  Kayden has not attended in person since last review.  Cardiac Rehab is operating at minimal capacity, RD unable to get nutrition re-eval this 30 day cycle.  30 day review completed. ITP sent to Dr. Emily Filbert, Medical Director of Cardiac and Pulmonary Rehab. Continue with ITP unless changes are made by physician.  Department operating under reduced schedule until further notice by request from hospital leadership.   [place] Name 06/03/19 1318 06/21/19 1157 06/26/19 0630 07/24/19 0621 08/21/19 0549   ITP Comments  Virtual call completed today. Calls are done every week that patient is not attending an onsite exercise session during the COVID 19 P[facility] Crisis. Today's call included review of : their home exercise, their personal goals, a psychosocial review.Cael reports exercising very day using a stepper and walking. He reports using 5 lb weights for resistive work and has no symptoms or concerns.  Virtual follow up appt completed today for goal review.  Pt also noted that he will be out on Monday as the cable company is coming to fix his phone line that afternoon.  30 day chart review completed. ITP sent to Dr Zachery Dakins Medical Director, for review,changes as needed and signature.  30 day  chart review completed. ITP sent to Dr Zachery Dakins Medical Director, for review,changes as needed and signature. Continue with ITP if no changes requested  30 Day review completed. Medical Director review done, changes made as directed,and approval shown by signature of Market researcher.   [place] Name 09/02/19 1312           ITP Comments  Emari graduated today from  rehab with 36 sessions completed.  Details of the patient's exercise prescription and what He needs to do in order to continue the prescription and progress were discussed with patient.  Patient was given a copy of prescription and goals.  Patient verbalized understanding.  Khye plans to continue to exercise by attending the [facility] and walking.          Comments: Discharge ITP

## 2019-09-02 NOTE — Progress Notes (Signed)
Discharge Progress Report  Patient Details  Name: Patrick Wilson MRN: [id number] Date of Birth: 03-18-1934 Referring Provider:     Cardiac Rehab from 04/02/2019 in [facility]  Referring Provider  Sabra Heck [[place]]       Number of Visits: 36  Reason for Discharge:  Patient reached a stable level of exercise. Patient independent in their exercise. Patient has met program and personal goals.  Smoking History:  Social History   Tobacco Use  Smoking Status Never Smoker  Smokeless Tobacco Current User  . Types: Chew  Tobacco Comment   May be ready to quit.    Diagnosis:  ST elevation myocardial infarction (STEMI), unspecified artery (HCC)  ADL UCSD:   Initial Exercise Prescription: Initial Exercise Prescription - 04/02/19 1500      Date of Initial Exercise RX and Referring Provider   Date  04/02/19    Referring Provider  [name]     Treadmill   MPH  2.7    Grade  0    Minutes  15    METs  3.07      Recumbant Bike   Level  3    RPM  60    Watts  25    Minutes  15    METs  3      Elliptical   Level  1    Speed  3    Minutes  15      REL-XR   Level  3    Speed  50    Minutes  15    METs  3      T5 Nustep   Level  2    SPM  80    Minutes  15    METs  3      Biostep-RELP   Level  3    SPM  50    Minutes  15    METs  3      Prescription Details   Frequency (times per week)  3    Duration  Progress to 30 minutes of continuous aerobic without signs/symptoms of physical distress      Intensity   THRR 40-80% of Max Heartrate  94-122    Ratings of Perceived Exertion  11-15    Perceived Dyspnea  0-4      Resistance Training   Training Prescription  Yes    Weight  4 lb    Reps  10-15       Discharge Exercise Prescription (Final Exercise Prescription Changes): Exercise Prescription Changes - 08/27/19 1500      Response to Exercise   Blood Pressure (Admit)  132/60    Blood Pressure (Exercise)  146/62     Blood Pressure (Exit)  100/60    Heart Rate (Admit)  65 bpm    Heart Rate (Exercise)  104 bpm    Heart Rate (Exit)  68 bpm    Rating of Perceived Exertion (Exercise)  19    Symptoms  none    Duration  Continue with 30 min of aerobic exercise without signs/symptoms of physical distress.    Intensity  THRR unchanged      Progression   Progression  Continue to progress workloads to maintain intensity without signs/symptoms of physical distress.    Average METs  3.05      Resistance Training   Training Prescription  Yes    Weight  5 lb    Reps  10-15  Interval Training   Interval Training  No      Treadmill   MPH  1.8    Grade  2    Minutes  15    METs  2.87      Recumbant Bike   Level  6    Watts  43    Minutes  15    METs  3.89      Arm Ergometer   Level  1    Minutes  15    METs  2.4      Elliptical   Level  1    Speed  3    Minutes  15      Home Exercise Plan   Plans to continue exercise at  Home (comment)   walking, return to [facility] 3 additional days to program exercise sessions.    Initial Home Exercises Provided  04/15/19       Functional Capacity: 6 Minute Walk    Row Name 04/02/19 1458 08/21/19 1348       6 Minute Walk   Phase  Initial  Discharge    Distance  1600 feet  1875 feet    Distance % Change  --  17.2 %    Distance Feet Change  --  275 ft    Walk Time  6 minutes  6 minutes    # of Rest Breaks  0  0    MPH  3.03  3.55    METS  2.98  3.29    RPE  12  15    Perceived Dyspnea   1  --    VO2 Peak  10.44  11.53    Symptoms  No  No    Resting HR  65 bpm  65 bpm    Resting BP  134/68  132/74    Resting Oxygen Saturation   97 %  --    Exercise Oxygen Saturation  during 6 min walk  95 %  --    Max Ex. HR  2.31 bpm  95 bpm    Max Ex. BP  148/64  138/76    2 Minute Post BP  138/74  --       Psychological, QOL, Others - Outcomes: PHQ 2/9: Depression screen PHQ 2/9 04/02/2019  Decreased Interest 0  Down,  Depressed, Hopeless 0  PHQ - 2 Score 0  Altered sleeping 0  Tired, decreased energy 2  Trouble concentrating 0  Moving slowly or fidgety/restless 0  Suicidal thoughts 0  PHQ-9 Score 2  Difficult doing work/chores Not difficult at all    Quality of Life: Quality of Life - 04/02/19 1515      Quality of Life   Select  Quality of Life      Quality of Life Scores   Health/Function Pre  27.43 %    Socioeconomic Pre  30 %    Psych/Spiritual Pre  30 %    Family Pre  30 %    GLOBAL Pre  28.84 %       Personal Goals: Goals established at orientation with interventions provided to work toward goal. Personal Goals and Risk Factors at Admission - 04/02/19 1511      Core Components/Risk Factors/Patient Goals on Admission    Weight Management  Weight Maintenance;Yes    Intervention  Weight Management: Develop a combined nutrition and exercise program designed to reach desired caloric intake, while maintaining appropriate intake of nutrient and  fiber, sodium and fats, and appropriate energy expenditure required for the weight goal.;Weight Management: Provide education and appropriate resources to help participant work on and attain dietary goals.    Admit Weight  175 lb 3.2 oz (79.5 kg)    Expected Outcomes  Short Term: Continue to assess and modify interventions until short term weight is achieved;Long Term: Adherence to nutrition and physical activity/exercise program aimed toward attainment of established weight goal;Weight Maintenance: Understanding of the daily nutrition guidelines, which includes 25-35% calories from fat, 7% or less cal from saturated fats, less than 217m cholesterol, less than 1.5gm of sodium, & 5 or more servings of fruits and vegetables daily    Intervention  Assist the participant in steps to quit. Provide individualized education and counseling about committing to Tobacco Cessation, relapse prevention, and pharmacological support that can be provided by  physician.;OAdvice worker assist with locating and accessing local/national Quit Smoking programs, and support quit date choice.    Expected Outcomes  Short Term: Will demonstrate readiness to quit, by selecting a quit date.;Short Term: Will quit all tobacco product use, adhering to prevention of relapse plan.;Long Term: Complete abstinence from all tobacco products for at least 12 months from quit date.        Personal Goals Discharge: Goals and Risk Factor Review    Row Name 04/15/19 1447 05/20/19 1332 06/21/19 1202 07/08/19 1327 07/29/19 1320     Core Components/Risk Factors/Patient Goals Review   Personal Goals Review  Weight Management/Obesity;Tobacco Cessation;Hypertension;Lipids  Weight Management/Obesity;Tobacco Cessation;Hypertension;Lipids  Weight Management/Obesity;Hypertension  Weight Management/Obesity;Hypertension  Weight Management/Obesity;Hypertension   Review  AJustois doing well in rehab.  He is already down to 165 lb at home!  He is pleased with his progress and wants to continue to build muscle and energy levels.  Blood pressures have been good and he does check them at home.  He notices when it runs high/low based on how he feels.  He is doing well on his medications.  He has cut back on chewing tobacco and is making strides towards quitting altogether.  He wants to go slowly but is mentally ready to quit.  AMoustaphareports that he is taking his meds a prescribed by his doctor. He is maintaining a steady weight. Does not smoke, but still uses chewing tobacco.  His pressures have been doing good and his weight has been fairly steady. Surprisingly he is not gaining weight since he makes an afternoon out of his exercise session by going to [employer] after class for late lunch/early dinner.  His pressures have been doing good and his weight has been fairly steady.  APhatis doing well in rehab.  Blood pressure and weights have been doing good.  He is holding steady.  He  is working on staying hydrating to help manage blood pressures.   Expected Outcomes  Short: Continue to work toward tobacco cessation.  Long: Continue to build stamina.  Short: Continue to work toward tobacco cessation. Long: Continue to build stamina.  Short: Continue to monitor weight with his diet.  Long; Continue to montior risk factors.  Short: Continue to monitor weight with his diet.  Long; Continue to montior risk factors.  Short: Continue to keep eye on blood pressue.  Long: Continue to monitor risk factors.      Exercise Goals and Review: Exercise Goals    Row Name 04/02/19 1510             Exercise Goals   Increase Physical  Activity  Yes       Intervention  Provide advice, education, support and counseling about physical activity/exercise needs.;Develop an individualized exercise prescription for aerobic and resistive training based on initial evaluation findings, risk stratification, comorbidities and participant's personal goals.       Expected Outcomes  Short Term: Attend rehab on a regular basis to increase amount of physical activity.;Long Term: Add in home exercise to make exercise part of routine and to increase amount of physical activity.;Long Term: Exercising regularly at least 3-5 days a week.       Increase Strength and Stamina  Yes       Intervention  Provide advice, education, support and counseling about physical activity/exercise needs.;Develop an individualized exercise prescription for aerobic and resistive training based on initial evaluation findings, risk stratification, comorbidities and participant's personal goals.       Expected Outcomes  Short Term: Increase workloads from initial exercise prescription for resistance, speed, and METs.;Short Term: Perform resistance training exercises routinely during rehab and add in resistance training at home;Long Term: Improve cardiorespiratory fitness, muscular endurance and strength as measured by increased METs and  functional capacity (6MWT)       Able to understand and use rate of perceived exertion (RPE) scale  Yes       Intervention  Provide education and explanation on how to use RPE scale       Expected Outcomes  Short Term: Able to use RPE daily in rehab to express subjective intensity level;Long Term:  Able to use RPE to guide intensity level when exercising independently       Knowledge and understanding of Target Heart Rate Range (THRR)  Yes       Intervention  Provide education and explanation of THRR including how the numbers were predicted and where they are located for reference       Expected Outcomes  Short Term: Able to state/look up THRR;Short Term: Able to use daily as guideline for intensity in rehab;Long Term: Able to use THRR to govern intensity when exercising independently       Able to check pulse independently  Yes       Intervention  Provide education and demonstration on how to check pulse in carotid and radial arteries.;Review the importance of being able to check your own pulse for safety during independent exercise       Expected Outcomes  Short Term: Able to explain why pulse checking is important during independent exercise;Long Term: Able to check pulse independently and accurately       Understanding of Exercise Prescription  Yes       Intervention  Provide education, explanation, and written materials on patient's individual exercise prescription       Expected Outcomes  Short Term: Able to explain program exercise prescription;Long Term: Able to explain home exercise prescription to exercise independently          Exercise Goals Re-Evaluation: Exercise Goals Re-Evaluation    Row Name 04/03/19 1424 04/10/19 1131 04/15/19 1456 04/23/19 1318 05/10/19 0936     Exercise Goal Re-Evaluation   Exercise Goals Review  Increase Physical Activity;Able to understand and use rate of perceived exertion (RPE) scale;Knowledge and understanding of Target Heart Rate Range  (THRR);Understanding of Exercise Prescription;Increase Strength and Stamina;Able to check pulse independently  Increase Physical Activity;Increase Strength and Stamina;Understanding of Exercise Prescription  Increase Physical Activity;Increase Strength and Stamina;Understanding of Exercise Prescription;Able to understand and use rate of perceived exertion (RPE) scale;Able to understand and use  Dyspnea scale;Knowledge and understanding of Target Heart Rate Range (THRR);Able to check pulse independently  Increase Physical Activity;Increase Strength and Stamina;Able to understand and use rate of perceived exertion (RPE) scale;Knowledge and understanding of Target Heart Rate Range (THRR);Able to check pulse independently;Understanding of Exercise Prescription  Increase Physical Activity;Increase Strength and Stamina;Understanding of Exercise Prescription   Comments  Reviewed RPE scale, THR and program prescription with pt today.  Pt voiced understanding and was given a copy of goals to take home.  Derrek is off to a good start in rehab.  He is doing 2.3 mph on the treadmill as the 2.7 mph felt a little fast.  We will continue to montior his progress.  Moxon is doing well in rehab.  He has already started to do some stretching and walking at home. Reviewed home exercise with pt today.  Pt plans to walk and use staff videos for exercise.  Reviewed THR, pulse, RPE, sign and symptoms, and when to call 911 or MD.  Also discussed weather considerations and indoor options.  Pt voiced understanding.  Treyven works in [employer] and RPE range.  He has increased to 5 lb weights.  Staff will monitor progress.  Purcell has been doing well in rehab.  He tried the ellipitcal just before Christmas and found it challenging.  He is also up to level 4 on the bike.  We will continue to monitor his progress.   Expected Outcomes  Short: Use RPE daily to regulate intensity. Long: Follow program prescription in THR.  Short: Continue to attend  regularly.  Long: Continue to follow program prescritpion  Short: Continue to walk at home on off days.  Long: Continue to improve stamina.  Short - continue to exercise conistently Long improve overall stamina  Short: Continue to exercise on off days.  Long: Continue to improve stamina to return to [place].   [place] Name 05/20/19 1330 06/18/19 1228 06/21/19 1158 07/03/19 1207 07/08/19 1325     Exercise Goal Re-Evaluation   Exercise Goals Review  Increase Physical Activity;Increase Strength and Stamina;Understanding of Exercise Prescription  Increase Physical Activity;Increase Strength and Stamina;Able to understand and use rate of perceived exertion (RPE) scale;Knowledge and understanding of Target Heart Rate Range (THRR);Able to check pulse independently;Understanding of Exercise Prescription;Able to understand and use Dyspnea scale  Increase Physical Activity;Increase Strength and Stamina;Understanding of Exercise Prescription  Increase Physical Activity;Increase Strength and Stamina;Understanding of Exercise Prescription  Increase Physical Activity;Increase Strength and Stamina;Understanding of Exercise Prescription   Comments  Temitope reported that he is exercising about every day. He is walking, using a stepper, using weights, and stretching. He stated that he is very committed to his cardiac rehab program and is anxious to get back to coming to [facility] 3 days a week. He stated his commitment to continue with home exercise until the program re-opens fully.  Alija has been trying the elliptical during classes.  It is challenging for him but he is willing to work on increasing time.  Dontrel has been doing well with his exercise at home.  He is using his stepper and weights daily. He missed coming to class yesterday as it is also his social outlet.  Abrahan is doing well in rehab.  He is up to 3.1 METs on the NuStep.  We will conitnue to montiro his progress.  Mynor is doing well in rehab.  He is up to 3.1 METs on the  NuStep.  We will conitnue to montiro his progress. Pt reports staying active at  home. He uses a stepper at home: will do it intermittently such as during commercials. He will also walk up and down the hallway and walking to mailbox. He is doing 15 reps with his weights now.   Expected Outcomes  Short: continue to do home exercise until cardiac rehab fully re-opens, and then come back to exercise in [facility] 3 days per week. Long: continue to improve stamina.  Short : work up to 15 min on elliptical without stopping Long :  build stamina  Short: Continue to exercise on off days.  Long: Continue to build stamina.  Short: Add incline to treadmill.  Long: Continue to improve stamina.  Short: Add incline to treadmill.  Long: Continue to improve stamina.   [place] Name 07/16/19 1549 07/29/19 1317 08/15/19 1248 08/27/19 1539       Exercise Goal Re-Evaluation   Exercise Goals Review  Increase Physical Activity;Increase Strength and Stamina;Able to understand and use rate of perceived exertion (RPE) scale;Able to understand and use Dyspnea scale;Knowledge and understanding of Target Heart Rate Range (THRR);Able to check pulse independently;Understanding of Exercise Prescription  Increase Physical Activity;Increase Strength and Stamina;Understanding of Exercise Prescription  Increase Physical Activity;Increase Strength and Stamina;Able to understand and use rate of perceived exertion (RPE) scale;Able to understand and use Dyspnea scale;Knowledge and understanding of Target Heart Rate Range (THRR);Able to check pulse independently;Understanding of Exercise Prescription  Increase Physical Activity;Increase Strength and Stamina;Understanding of Exercise Prescription    Comments  [place] has missed sessions due to some issues with BP.  He will return when his Dr clears him.  Rayshard is doing well in rehab. He is sore after exercise from getting in a good workout.  He feels that he is getting in better shape and enjoys coming to class.   He is walking at home on his off days.  Corby enjoys exercise - he told staff he doesnt want to graduate !  H eplasn to go to the [facility] after graduation.  Jermone is doing well in rehab.  He will be graduating next week!  He improved his post 6MWT by 275 ft!!    Expected Outcomes  Short: get BP under control Long: get back to rehab classes  Short: Continue to exericse on off days.  Long: Continue to be independent in exercise.  Short:  complete HT program Long: maintain exercise on his own  Short; Graduate  Long: Continue to attend the [place] & Weight - Outcomes: Pre Biometrics - 04/02/19 1510      Pre Biometrics   Height  '5\' 8"'  (1.727 m)    Weight  175 lb 3.2 oz (79.5 kg)    BMI (Calculated)  26.65    Single Leg Stand  10.31 seconds      Post Biometrics - 08/21/19 1349       Post  Biometrics   Height  '5\' 8"'  (1.727 m)    Weight  171 lb 4.8 oz (77.7 kg)    BMI (Calculated)  26.05       Nutrition: Nutrition Therapy & Goals - 04/04/19 1003      Nutrition Therapy   Diet  Low Na, HH diet    Protein (specify units)  65g    Fiber  30 grams    Whole Grain Foods  3 servings    Saturated Fats  12 max. grams    Fruits and Vegetables  5 servings/day    Sodium  1.5 grams  Personal Nutrition Goals   Nutrition Goal  ST: reduce Na LT: UBW 175#, wants to get down to 165# - wants to gain strength    Comments  Pt reports not eating properly, but is trying. Pt wants to build skills. B: 2 cups coffee with danish or cereal like raisin bran with whole milk and banana. Drinks water during the day. L:meatloaf, stewed apples with pintos or corn and okra etc. at [employer] (has this the 3 days coming to rehab) and frozen pizza (half) and boiled eggs w/ turnip greens and corned bread and meatloaf with baked beans/ peas and frozen vegetables with meat. Pt reports loving vegetables. S: small box of raisins most days, ritz crackers with jiff peanut butter with a glass of whole milk  (1-2x/week). Pt reports loving his families homemade chocolate pie. Will sometimes have a can of tuna (bread and butter pickle juice) with some ritz crackers. Pt reports sodium is a problem for him. Pt also reports likes [place] stew. Discussed general [place] eating. Pt would like to also work on skill goals, pt reports mild ST memory issues, discussed giving him handouts to keep for review and providing him with youtube skills videos (reports having access to computer).      Intervention Plan   Intervention  Nutrition handout(s) given to patient.;Prescribe, educate and counsel regarding individualized specific dietary modifications aiming towards targeted core components such as weight, hypertension, lipid management, diabetes, heart failure and other comorbidities.    Expected Outcomes  Short Term Goal: Understand basic principles of dietary content, such as calories, fat, sodium, cholesterol and nutrients.;Short Term Goal: A plan has been developed with personal nutrition goals set during dietitian appointment.;Long Term Goal: Adherence to prescribed nutrition plan.       Nutrition Discharge:   Education Questionnaire Score: Knowledge Questionnaire Score - 04/02/19 1514      Knowledge Questionnaire Score   Pre Score  19/26 nutrition       Goals reviewed with patient; copy given to patient.

## 2019-09-17 DIAGNOSIS — M9905 Segmental and somatic dysfunction of pelvic region: Secondary | ICD-10-CM | POA: Diagnosis not present

## 2019-09-17 DIAGNOSIS — M62830 Muscle spasm of back: Secondary | ICD-10-CM | POA: Diagnosis not present

## 2019-09-17 DIAGNOSIS — M9903 Segmental and somatic dysfunction of lumbar region: Secondary | ICD-10-CM | POA: Diagnosis not present

## 2019-09-17 DIAGNOSIS — M5416 Radiculopathy, lumbar region: Secondary | ICD-10-CM | POA: Diagnosis not present

## 2019-09-19 DIAGNOSIS — M5416 Radiculopathy, lumbar region: Secondary | ICD-10-CM | POA: Diagnosis not present

## 2019-09-19 DIAGNOSIS — M62830 Muscle spasm of back: Secondary | ICD-10-CM | POA: Diagnosis not present

## 2019-09-19 DIAGNOSIS — M9903 Segmental and somatic dysfunction of lumbar region: Secondary | ICD-10-CM | POA: Diagnosis not present

## 2019-09-19 DIAGNOSIS — M9905 Segmental and somatic dysfunction of pelvic region: Secondary | ICD-10-CM | POA: Diagnosis not present

## 2019-09-23 DIAGNOSIS — M9905 Segmental and somatic dysfunction of pelvic region: Secondary | ICD-10-CM | POA: Diagnosis not present

## 2019-09-23 DIAGNOSIS — M9903 Segmental and somatic dysfunction of lumbar region: Secondary | ICD-10-CM | POA: Diagnosis not present

## 2019-09-23 DIAGNOSIS — M5416 Radiculopathy, lumbar region: Secondary | ICD-10-CM | POA: Diagnosis not present

## 2019-09-23 DIAGNOSIS — M62830 Muscle spasm of back: Secondary | ICD-10-CM | POA: Diagnosis not present

## 2019-09-26 DIAGNOSIS — M9903 Segmental and somatic dysfunction of lumbar region: Secondary | ICD-10-CM | POA: Diagnosis not present

## 2019-09-26 DIAGNOSIS — M9905 Segmental and somatic dysfunction of pelvic region: Secondary | ICD-10-CM | POA: Diagnosis not present

## 2019-09-26 DIAGNOSIS — M62830 Muscle spasm of back: Secondary | ICD-10-CM | POA: Diagnosis not present

## 2019-09-26 DIAGNOSIS — M5416 Radiculopathy, lumbar region: Secondary | ICD-10-CM | POA: Diagnosis not present

## 2019-10-01 DIAGNOSIS — M9903 Segmental and somatic dysfunction of lumbar region: Secondary | ICD-10-CM | POA: Diagnosis not present

## 2019-10-01 DIAGNOSIS — M62830 Muscle spasm of back: Secondary | ICD-10-CM | POA: Diagnosis not present

## 2019-10-01 DIAGNOSIS — M9905 Segmental and somatic dysfunction of pelvic region: Secondary | ICD-10-CM | POA: Diagnosis not present

## 2019-10-01 DIAGNOSIS — M5416 Radiculopathy, lumbar region: Secondary | ICD-10-CM | POA: Diagnosis not present

## 2019-10-03 DIAGNOSIS — M5416 Radiculopathy, lumbar region: Secondary | ICD-10-CM | POA: Diagnosis not present

## 2019-10-03 DIAGNOSIS — M9905 Segmental and somatic dysfunction of pelvic region: Secondary | ICD-10-CM | POA: Diagnosis not present

## 2019-10-03 DIAGNOSIS — M62830 Muscle spasm of back: Secondary | ICD-10-CM | POA: Diagnosis not present

## 2019-10-03 DIAGNOSIS — M9903 Segmental and somatic dysfunction of lumbar region: Secondary | ICD-10-CM | POA: Diagnosis not present

## 2019-10-08 DIAGNOSIS — M5416 Radiculopathy, lumbar region: Secondary | ICD-10-CM | POA: Diagnosis not present

## 2019-10-08 DIAGNOSIS — M9903 Segmental and somatic dysfunction of lumbar region: Secondary | ICD-10-CM | POA: Diagnosis not present

## 2019-10-08 DIAGNOSIS — M62830 Muscle spasm of back: Secondary | ICD-10-CM | POA: Diagnosis not present

## 2019-10-08 DIAGNOSIS — M9905 Segmental and somatic dysfunction of pelvic region: Secondary | ICD-10-CM | POA: Diagnosis not present

## 2019-10-10 DIAGNOSIS — M9903 Segmental and somatic dysfunction of lumbar region: Secondary | ICD-10-CM | POA: Diagnosis not present

## 2019-10-10 DIAGNOSIS — M5416 Radiculopathy, lumbar region: Secondary | ICD-10-CM | POA: Diagnosis not present

## 2019-10-10 DIAGNOSIS — M9905 Segmental and somatic dysfunction of pelvic region: Secondary | ICD-10-CM | POA: Diagnosis not present

## 2019-10-10 DIAGNOSIS — M62830 Muscle spasm of back: Secondary | ICD-10-CM | POA: Diagnosis not present

## 2019-10-15 DIAGNOSIS — M5416 Radiculopathy, lumbar region: Secondary | ICD-10-CM | POA: Diagnosis not present

## 2019-10-15 DIAGNOSIS — M62830 Muscle spasm of back: Secondary | ICD-10-CM | POA: Diagnosis not present

## 2019-10-15 DIAGNOSIS — M9905 Segmental and somatic dysfunction of pelvic region: Secondary | ICD-10-CM | POA: Diagnosis not present

## 2019-10-15 DIAGNOSIS — M9903 Segmental and somatic dysfunction of lumbar region: Secondary | ICD-10-CM | POA: Diagnosis not present

## 2019-10-17 DIAGNOSIS — M9905 Segmental and somatic dysfunction of pelvic region: Secondary | ICD-10-CM | POA: Diagnosis not present

## 2019-10-17 DIAGNOSIS — M5416 Radiculopathy, lumbar region: Secondary | ICD-10-CM | POA: Diagnosis not present

## 2019-10-17 DIAGNOSIS — M62830 Muscle spasm of back: Secondary | ICD-10-CM | POA: Diagnosis not present

## 2019-10-17 DIAGNOSIS — M9903 Segmental and somatic dysfunction of lumbar region: Secondary | ICD-10-CM | POA: Diagnosis not present

## 2019-10-22 DIAGNOSIS — M9905 Segmental and somatic dysfunction of pelvic region: Secondary | ICD-10-CM | POA: Diagnosis not present

## 2019-10-22 DIAGNOSIS — M5416 Radiculopathy, lumbar region: Secondary | ICD-10-CM | POA: Diagnosis not present

## 2019-10-22 DIAGNOSIS — M9903 Segmental and somatic dysfunction of lumbar region: Secondary | ICD-10-CM | POA: Diagnosis not present

## 2019-10-22 DIAGNOSIS — M62830 Muscle spasm of back: Secondary | ICD-10-CM | POA: Diagnosis not present

## 2019-10-24 DIAGNOSIS — M5416 Radiculopathy, lumbar region: Secondary | ICD-10-CM | POA: Diagnosis not present

## 2019-10-24 DIAGNOSIS — M9903 Segmental and somatic dysfunction of lumbar region: Secondary | ICD-10-CM | POA: Diagnosis not present

## 2019-10-24 DIAGNOSIS — M62830 Muscle spasm of back: Secondary | ICD-10-CM | POA: Diagnosis not present

## 2019-10-24 DIAGNOSIS — M9905 Segmental and somatic dysfunction of pelvic region: Secondary | ICD-10-CM | POA: Diagnosis not present

## 2019-10-29 DIAGNOSIS — M62830 Muscle spasm of back: Secondary | ICD-10-CM | POA: Diagnosis not present

## 2019-10-29 DIAGNOSIS — M5416 Radiculopathy, lumbar region: Secondary | ICD-10-CM | POA: Diagnosis not present

## 2019-10-29 DIAGNOSIS — M9905 Segmental and somatic dysfunction of pelvic region: Secondary | ICD-10-CM | POA: Diagnosis not present

## 2019-10-29 DIAGNOSIS — E782 Mixed hyperlipidemia: Secondary | ICD-10-CM | POA: Diagnosis not present

## 2019-10-29 DIAGNOSIS — M9903 Segmental and somatic dysfunction of lumbar region: Secondary | ICD-10-CM | POA: Diagnosis not present

## 2019-10-29 DIAGNOSIS — I2119 ST elevation (STEMI) myocardial infarction involving other coronary artery of inferior wall: Secondary | ICD-10-CM | POA: Diagnosis not present

## 2019-11-06 DIAGNOSIS — M9903 Segmental and somatic dysfunction of lumbar region: Secondary | ICD-10-CM | POA: Diagnosis not present

## 2019-11-06 DIAGNOSIS — M9905 Segmental and somatic dysfunction of pelvic region: Secondary | ICD-10-CM | POA: Diagnosis not present

## 2019-11-06 DIAGNOSIS — M5416 Radiculopathy, lumbar region: Secondary | ICD-10-CM | POA: Diagnosis not present

## 2019-11-06 DIAGNOSIS — M62830 Muscle spasm of back: Secondary | ICD-10-CM | POA: Diagnosis not present

## 2019-11-19 DIAGNOSIS — M5116 Intervertebral disc disorders with radiculopathy, lumbar region: Secondary | ICD-10-CM | POA: Diagnosis not present

## 2019-12-05 DIAGNOSIS — D225 Melanocytic nevi of trunk: Secondary | ICD-10-CM | POA: Diagnosis not present

## 2019-12-05 DIAGNOSIS — D2272 Melanocytic nevi of left lower limb, including hip: Secondary | ICD-10-CM | POA: Diagnosis not present

## 2019-12-05 DIAGNOSIS — L570 Actinic keratosis: Secondary | ICD-10-CM | POA: Diagnosis not present

## 2019-12-05 DIAGNOSIS — Z85828 Personal history of other malignant neoplasm of skin: Secondary | ICD-10-CM | POA: Diagnosis not present

## 2019-12-05 DIAGNOSIS — D2261 Melanocytic nevi of right upper limb, including shoulder: Secondary | ICD-10-CM | POA: Diagnosis not present

## 2019-12-05 DIAGNOSIS — B353 Tinea pedis: Secondary | ICD-10-CM | POA: Diagnosis not present

## 2019-12-05 DIAGNOSIS — D2262 Melanocytic nevi of left upper limb, including shoulder: Secondary | ICD-10-CM | POA: Diagnosis not present

## 2019-12-05 DIAGNOSIS — X32XXXA Exposure to sunlight, initial encounter: Secondary | ICD-10-CM | POA: Diagnosis not present

## 2019-12-23 DIAGNOSIS — E782 Mixed hyperlipidemia: Secondary | ICD-10-CM | POA: Diagnosis not present

## 2020-01-01 DIAGNOSIS — I700 Atherosclerosis of aorta: Secondary | ICD-10-CM | POA: Diagnosis not present

## 2020-01-01 DIAGNOSIS — Z125 Encounter for screening for malignant neoplasm of prostate: Secondary | ICD-10-CM | POA: Diagnosis not present

## 2020-01-01 DIAGNOSIS — E875 Hyperkalemia: Secondary | ICD-10-CM | POA: Diagnosis not present

## 2020-03-09 DIAGNOSIS — J301 Allergic rhinitis due to pollen: Secondary | ICD-10-CM | POA: Diagnosis not present

## 2020-03-09 DIAGNOSIS — J321 Chronic frontal sinusitis: Secondary | ICD-10-CM | POA: Diagnosis not present

## 2020-03-09 DIAGNOSIS — J019 Acute sinusitis, unspecified: Secondary | ICD-10-CM | POA: Diagnosis not present

## 2020-03-09 DIAGNOSIS — J309 Allergic rhinitis, unspecified: Secondary | ICD-10-CM | POA: Diagnosis not present

## 2020-03-11 ENCOUNTER — Other Ambulatory Visit: Payer: Self-pay | Admitting: Physician Assistant

## 2020-03-11 DIAGNOSIS — J019 Acute sinusitis, unspecified: Secondary | ICD-10-CM

## 2020-03-23 ENCOUNTER — Ambulatory Visit
Admission: RE | Admit: 2020-03-23 | Discharge: 2020-03-23 | Disposition: A | Discharge status: Home / Self Care | Payer: Medicare HMO | Source: Ambulatory Visit | Attending: Physician Assistant | Admitting: Physician Assistant

## 2020-03-23 ENCOUNTER — Other Ambulatory Visit: Payer: Self-pay

## 2020-03-23 DIAGNOSIS — J342 Deviated nasal septum: Secondary | ICD-10-CM | POA: Diagnosis not present

## 2020-03-23 DIAGNOSIS — J019 Acute sinusitis, unspecified: Secondary | ICD-10-CM | POA: Insufficient documentation

## 2020-03-23 DIAGNOSIS — J3489 Other specified disorders of nose and nasal sinuses: Secondary | ICD-10-CM | POA: Diagnosis not present

## 2020-04-28 DIAGNOSIS — M62830 Muscle spasm of back: Secondary | ICD-10-CM | POA: Diagnosis not present

## 2020-04-28 DIAGNOSIS — M9903 Segmental and somatic dysfunction of lumbar region: Secondary | ICD-10-CM | POA: Diagnosis not present

## 2020-04-28 DIAGNOSIS — M5432 Sciatica, left side: Secondary | ICD-10-CM | POA: Diagnosis not present

## 2020-04-28 DIAGNOSIS — M9905 Segmental and somatic dysfunction of pelvic region: Secondary | ICD-10-CM | POA: Diagnosis not present

## 2020-04-30 DIAGNOSIS — M5432 Sciatica, left side: Secondary | ICD-10-CM | POA: Diagnosis not present

## 2020-04-30 DIAGNOSIS — M9903 Segmental and somatic dysfunction of lumbar region: Secondary | ICD-10-CM | POA: Diagnosis not present

## 2020-04-30 DIAGNOSIS — M9905 Segmental and somatic dysfunction of pelvic region: Secondary | ICD-10-CM | POA: Diagnosis not present

## 2020-04-30 DIAGNOSIS — M62830 Muscle spasm of back: Secondary | ICD-10-CM | POA: Diagnosis not present

## 2020-05-05 DIAGNOSIS — M9903 Segmental and somatic dysfunction of lumbar region: Secondary | ICD-10-CM | POA: Diagnosis not present

## 2020-05-05 DIAGNOSIS — M62830 Muscle spasm of back: Secondary | ICD-10-CM | POA: Diagnosis not present

## 2020-05-05 DIAGNOSIS — M9905 Segmental and somatic dysfunction of pelvic region: Secondary | ICD-10-CM | POA: Diagnosis not present

## 2020-05-05 DIAGNOSIS — M5432 Sciatica, left side: Secondary | ICD-10-CM | POA: Diagnosis not present

## 2020-05-07 DIAGNOSIS — M62830 Muscle spasm of back: Secondary | ICD-10-CM | POA: Diagnosis not present

## 2020-05-07 DIAGNOSIS — M5432 Sciatica, left side: Secondary | ICD-10-CM | POA: Diagnosis not present

## 2020-05-07 DIAGNOSIS — M9905 Segmental and somatic dysfunction of pelvic region: Secondary | ICD-10-CM | POA: Diagnosis not present

## 2020-05-07 DIAGNOSIS — M9903 Segmental and somatic dysfunction of lumbar region: Secondary | ICD-10-CM | POA: Diagnosis not present

## 2020-05-08 DIAGNOSIS — M9905 Segmental and somatic dysfunction of pelvic region: Secondary | ICD-10-CM | POA: Diagnosis not present

## 2020-05-08 DIAGNOSIS — M62830 Muscle spasm of back: Secondary | ICD-10-CM | POA: Diagnosis not present

## 2020-05-08 DIAGNOSIS — M9903 Segmental and somatic dysfunction of lumbar region: Secondary | ICD-10-CM | POA: Diagnosis not present

## 2020-05-08 DIAGNOSIS — M5432 Sciatica, left side: Secondary | ICD-10-CM | POA: Diagnosis not present

## 2020-05-11 DIAGNOSIS — M5432 Sciatica, left side: Secondary | ICD-10-CM | POA: Diagnosis not present

## 2020-05-11 DIAGNOSIS — M9903 Segmental and somatic dysfunction of lumbar region: Secondary | ICD-10-CM | POA: Diagnosis not present

## 2020-05-11 DIAGNOSIS — M62830 Muscle spasm of back: Secondary | ICD-10-CM | POA: Diagnosis not present

## 2020-05-11 DIAGNOSIS — M9905 Segmental and somatic dysfunction of pelvic region: Secondary | ICD-10-CM | POA: Diagnosis not present

## 2020-05-14 DIAGNOSIS — M5432 Sciatica, left side: Secondary | ICD-10-CM | POA: Diagnosis not present

## 2020-05-14 DIAGNOSIS — M9903 Segmental and somatic dysfunction of lumbar region: Secondary | ICD-10-CM | POA: Diagnosis not present

## 2020-05-14 DIAGNOSIS — M62830 Muscle spasm of back: Secondary | ICD-10-CM | POA: Diagnosis not present

## 2020-05-14 DIAGNOSIS — M9905 Segmental and somatic dysfunction of pelvic region: Secondary | ICD-10-CM | POA: Diagnosis not present

## 2020-05-20 DIAGNOSIS — M9903 Segmental and somatic dysfunction of lumbar region: Secondary | ICD-10-CM | POA: Diagnosis not present

## 2020-05-20 DIAGNOSIS — M62830 Muscle spasm of back: Secondary | ICD-10-CM | POA: Diagnosis not present

## 2020-05-20 DIAGNOSIS — M5432 Sciatica, left side: Secondary | ICD-10-CM | POA: Diagnosis not present

## 2020-05-20 DIAGNOSIS — M9905 Segmental and somatic dysfunction of pelvic region: Secondary | ICD-10-CM | POA: Diagnosis not present

## 2020-05-25 DIAGNOSIS — M5432 Sciatica, left side: Secondary | ICD-10-CM | POA: Diagnosis not present

## 2020-05-25 DIAGNOSIS — M62830 Muscle spasm of back: Secondary | ICD-10-CM | POA: Diagnosis not present

## 2020-05-25 DIAGNOSIS — M9903 Segmental and somatic dysfunction of lumbar region: Secondary | ICD-10-CM | POA: Diagnosis not present

## 2020-05-25 DIAGNOSIS — M9905 Segmental and somatic dysfunction of pelvic region: Secondary | ICD-10-CM | POA: Diagnosis not present

## 2020-06-01 DIAGNOSIS — M9905 Segmental and somatic dysfunction of pelvic region: Secondary | ICD-10-CM | POA: Diagnosis not present

## 2020-06-01 DIAGNOSIS — M9903 Segmental and somatic dysfunction of lumbar region: Secondary | ICD-10-CM | POA: Diagnosis not present

## 2020-06-01 DIAGNOSIS — M5432 Sciatica, left side: Secondary | ICD-10-CM | POA: Diagnosis not present

## 2020-06-01 DIAGNOSIS — M62830 Muscle spasm of back: Secondary | ICD-10-CM | POA: Diagnosis not present

## 2020-06-15 DIAGNOSIS — M5432 Sciatica, left side: Secondary | ICD-10-CM | POA: Diagnosis not present

## 2020-06-15 DIAGNOSIS — M9903 Segmental and somatic dysfunction of lumbar region: Secondary | ICD-10-CM | POA: Diagnosis not present

## 2020-06-15 DIAGNOSIS — M62830 Muscle spasm of back: Secondary | ICD-10-CM | POA: Diagnosis not present

## 2020-06-15 DIAGNOSIS — M9905 Segmental and somatic dysfunction of pelvic region: Secondary | ICD-10-CM | POA: Diagnosis not present

## 2020-06-25 DIAGNOSIS — E782 Mixed hyperlipidemia: Secondary | ICD-10-CM | POA: Diagnosis not present

## 2020-06-25 DIAGNOSIS — Z125 Encounter for screening for malignant neoplasm of prostate: Secondary | ICD-10-CM | POA: Diagnosis not present

## 2020-07-02 DIAGNOSIS — I2510 Atherosclerotic heart disease of native coronary artery without angina pectoris: Secondary | ICD-10-CM | POA: Diagnosis not present

## 2020-07-02 DIAGNOSIS — Z0000 Encounter for general adult medical examination without abnormal findings: Secondary | ICD-10-CM | POA: Diagnosis not present

## 2020-07-02 DIAGNOSIS — S9001XA Contusion of right ankle, initial encounter: Secondary | ICD-10-CM | POA: Diagnosis not present

## 2020-07-02 DIAGNOSIS — M7989 Other specified soft tissue disorders: Secondary | ICD-10-CM | POA: Diagnosis not present

## 2020-07-02 DIAGNOSIS — F0390 Unspecified dementia without behavioral disturbance: Secondary | ICD-10-CM | POA: Diagnosis not present

## 2020-07-02 DIAGNOSIS — M25571 Pain in right ankle and joints of right foot: Secondary | ICD-10-CM | POA: Diagnosis not present

## 2020-07-02 DIAGNOSIS — F32A Depression, unspecified: Secondary | ICD-10-CM | POA: Diagnosis not present

## 2020-07-09 DIAGNOSIS — L03115 Cellulitis of right lower limb: Secondary | ICD-10-CM | POA: Diagnosis not present

## 2020-07-09 DIAGNOSIS — R5383 Other fatigue: Secondary | ICD-10-CM | POA: Diagnosis not present

## 2020-07-13 DIAGNOSIS — M5432 Sciatica, left side: Secondary | ICD-10-CM | POA: Diagnosis not present

## 2020-07-13 DIAGNOSIS — M62830 Muscle spasm of back: Secondary | ICD-10-CM | POA: Diagnosis not present

## 2020-07-13 DIAGNOSIS — M9905 Segmental and somatic dysfunction of pelvic region: Secondary | ICD-10-CM | POA: Diagnosis not present

## 2020-07-13 DIAGNOSIS — M9903 Segmental and somatic dysfunction of lumbar region: Secondary | ICD-10-CM | POA: Diagnosis not present

## 2020-07-30 DIAGNOSIS — M62830 Muscle spasm of back: Secondary | ICD-10-CM | POA: Diagnosis not present

## 2020-07-30 DIAGNOSIS — M5432 Sciatica, left side: Secondary | ICD-10-CM | POA: Diagnosis not present

## 2020-07-30 DIAGNOSIS — M9905 Segmental and somatic dysfunction of pelvic region: Secondary | ICD-10-CM | POA: Diagnosis not present

## 2020-07-30 DIAGNOSIS — M9903 Segmental and somatic dysfunction of lumbar region: Secondary | ICD-10-CM | POA: Diagnosis not present

## 2020-08-19 DIAGNOSIS — M9905 Segmental and somatic dysfunction of pelvic region: Secondary | ICD-10-CM | POA: Diagnosis not present

## 2020-08-19 DIAGNOSIS — M9903 Segmental and somatic dysfunction of lumbar region: Secondary | ICD-10-CM | POA: Diagnosis not present

## 2020-08-19 DIAGNOSIS — M62830 Muscle spasm of back: Secondary | ICD-10-CM | POA: Diagnosis not present

## 2020-08-19 DIAGNOSIS — M5432 Sciatica, left side: Secondary | ICD-10-CM | POA: Diagnosis not present

## 2020-08-24 DIAGNOSIS — M62830 Muscle spasm of back: Secondary | ICD-10-CM | POA: Diagnosis not present

## 2020-08-24 DIAGNOSIS — M9905 Segmental and somatic dysfunction of pelvic region: Secondary | ICD-10-CM | POA: Diagnosis not present

## 2020-08-24 DIAGNOSIS — M5432 Sciatica, left side: Secondary | ICD-10-CM | POA: Diagnosis not present

## 2020-08-24 DIAGNOSIS — M9903 Segmental and somatic dysfunction of lumbar region: Secondary | ICD-10-CM | POA: Diagnosis not present

## 2020-08-31 DIAGNOSIS — M9905 Segmental and somatic dysfunction of pelvic region: Secondary | ICD-10-CM | POA: Diagnosis not present

## 2020-08-31 DIAGNOSIS — M9903 Segmental and somatic dysfunction of lumbar region: Secondary | ICD-10-CM | POA: Diagnosis not present

## 2020-08-31 DIAGNOSIS — M5432 Sciatica, left side: Secondary | ICD-10-CM | POA: Diagnosis not present

## 2020-08-31 DIAGNOSIS — M62830 Muscle spasm of back: Secondary | ICD-10-CM | POA: Diagnosis not present

## 2020-09-14 DIAGNOSIS — M62830 Muscle spasm of back: Secondary | ICD-10-CM | POA: Diagnosis not present

## 2020-09-14 DIAGNOSIS — M9903 Segmental and somatic dysfunction of lumbar region: Secondary | ICD-10-CM | POA: Diagnosis not present

## 2020-09-14 DIAGNOSIS — M9905 Segmental and somatic dysfunction of pelvic region: Secondary | ICD-10-CM | POA: Diagnosis not present

## 2020-09-14 DIAGNOSIS — M5432 Sciatica, left side: Secondary | ICD-10-CM | POA: Diagnosis not present

## 2020-09-29 DIAGNOSIS — M9905 Segmental and somatic dysfunction of pelvic region: Secondary | ICD-10-CM | POA: Diagnosis not present

## 2020-09-29 DIAGNOSIS — M62830 Muscle spasm of back: Secondary | ICD-10-CM | POA: Diagnosis not present

## 2020-09-29 DIAGNOSIS — M5432 Sciatica, left side: Secondary | ICD-10-CM | POA: Diagnosis not present

## 2020-09-29 DIAGNOSIS — M9903 Segmental and somatic dysfunction of lumbar region: Secondary | ICD-10-CM | POA: Diagnosis not present

## 2020-10-19 DIAGNOSIS — M5432 Sciatica, left side: Secondary | ICD-10-CM | POA: Diagnosis not present

## 2020-10-19 DIAGNOSIS — M62830 Muscle spasm of back: Secondary | ICD-10-CM | POA: Diagnosis not present

## 2020-10-19 DIAGNOSIS — M9905 Segmental and somatic dysfunction of pelvic region: Secondary | ICD-10-CM | POA: Diagnosis not present

## 2020-10-19 DIAGNOSIS — M9903 Segmental and somatic dysfunction of lumbar region: Secondary | ICD-10-CM | POA: Diagnosis not present

## 2020-11-05 DIAGNOSIS — L814 Other melanin hyperpigmentation: Secondary | ICD-10-CM | POA: Diagnosis not present

## 2020-11-05 DIAGNOSIS — Z85828 Personal history of other malignant neoplasm of skin: Secondary | ICD-10-CM | POA: Diagnosis not present

## 2020-11-05 DIAGNOSIS — D2262 Melanocytic nevi of left upper limb, including shoulder: Secondary | ICD-10-CM | POA: Diagnosis not present

## 2020-11-05 DIAGNOSIS — L821 Other seborrheic keratosis: Secondary | ICD-10-CM | POA: Diagnosis not present

## 2020-11-05 DIAGNOSIS — D485 Neoplasm of uncertain behavior of skin: Secondary | ICD-10-CM | POA: Diagnosis not present

## 2020-11-05 DIAGNOSIS — L570 Actinic keratosis: Secondary | ICD-10-CM | POA: Diagnosis not present

## 2020-11-05 DIAGNOSIS — D225 Melanocytic nevi of trunk: Secondary | ICD-10-CM | POA: Diagnosis not present

## 2020-11-05 DIAGNOSIS — C44319 Basal cell carcinoma of skin of other parts of face: Secondary | ICD-10-CM | POA: Diagnosis not present

## 2020-11-05 DIAGNOSIS — D2261 Melanocytic nevi of right upper limb, including shoulder: Secondary | ICD-10-CM | POA: Diagnosis not present

## 2020-11-16 DIAGNOSIS — M5432 Sciatica, left side: Secondary | ICD-10-CM | POA: Diagnosis not present

## 2020-11-16 DIAGNOSIS — M9905 Segmental and somatic dysfunction of pelvic region: Secondary | ICD-10-CM | POA: Diagnosis not present

## 2020-11-16 DIAGNOSIS — M9903 Segmental and somatic dysfunction of lumbar region: Secondary | ICD-10-CM | POA: Diagnosis not present

## 2020-11-16 DIAGNOSIS — M62830 Muscle spasm of back: Secondary | ICD-10-CM | POA: Diagnosis not present

## 2020-11-25 DIAGNOSIS — C44319 Basal cell carcinoma of skin of other parts of face: Secondary | ICD-10-CM | POA: Diagnosis not present

## 2020-12-07 DIAGNOSIS — M62830 Muscle spasm of back: Secondary | ICD-10-CM | POA: Diagnosis not present

## 2020-12-07 DIAGNOSIS — M9905 Segmental and somatic dysfunction of pelvic region: Secondary | ICD-10-CM | POA: Diagnosis not present

## 2020-12-07 DIAGNOSIS — M9903 Segmental and somatic dysfunction of lumbar region: Secondary | ICD-10-CM | POA: Diagnosis not present

## 2020-12-07 DIAGNOSIS — M5432 Sciatica, left side: Secondary | ICD-10-CM | POA: Diagnosis not present

## 2020-12-14 DIAGNOSIS — M62830 Muscle spasm of back: Secondary | ICD-10-CM | POA: Diagnosis not present

## 2020-12-14 DIAGNOSIS — M9905 Segmental and somatic dysfunction of pelvic region: Secondary | ICD-10-CM | POA: Diagnosis not present

## 2020-12-14 DIAGNOSIS — M9903 Segmental and somatic dysfunction of lumbar region: Secondary | ICD-10-CM | POA: Diagnosis not present

## 2020-12-14 DIAGNOSIS — M5432 Sciatica, left side: Secondary | ICD-10-CM | POA: Diagnosis not present

## 2020-12-21 DIAGNOSIS — M9905 Segmental and somatic dysfunction of pelvic region: Secondary | ICD-10-CM | POA: Diagnosis not present

## 2020-12-21 DIAGNOSIS — M5432 Sciatica, left side: Secondary | ICD-10-CM | POA: Diagnosis not present

## 2020-12-21 DIAGNOSIS — M9903 Segmental and somatic dysfunction of lumbar region: Secondary | ICD-10-CM | POA: Diagnosis not present

## 2020-12-21 DIAGNOSIS — M62830 Muscle spasm of back: Secondary | ICD-10-CM | POA: Diagnosis not present

## 2020-12-28 DIAGNOSIS — M5432 Sciatica, left side: Secondary | ICD-10-CM | POA: Diagnosis not present

## 2020-12-28 DIAGNOSIS — M9905 Segmental and somatic dysfunction of pelvic region: Secondary | ICD-10-CM | POA: Diagnosis not present

## 2020-12-28 DIAGNOSIS — M9903 Segmental and somatic dysfunction of lumbar region: Secondary | ICD-10-CM | POA: Diagnosis not present

## 2020-12-28 DIAGNOSIS — M62830 Muscle spasm of back: Secondary | ICD-10-CM | POA: Diagnosis not present

## 2020-12-29 DIAGNOSIS — E782 Mixed hyperlipidemia: Secondary | ICD-10-CM | POA: Diagnosis not present

## 2021-01-05 DIAGNOSIS — N183 Chronic kidney disease, stage 3 unspecified: Secondary | ICD-10-CM | POA: Diagnosis not present

## 2021-01-05 DIAGNOSIS — Z125 Encounter for screening for malignant neoplasm of prostate: Secondary | ICD-10-CM | POA: Diagnosis not present

## 2021-01-05 DIAGNOSIS — I739 Peripheral vascular disease, unspecified: Secondary | ICD-10-CM | POA: Diagnosis not present

## 2021-01-05 DIAGNOSIS — Z23 Encounter for immunization: Secondary | ICD-10-CM | POA: Diagnosis not present

## 2021-01-05 DIAGNOSIS — F0390 Unspecified dementia without behavioral disturbance: Secondary | ICD-10-CM | POA: Diagnosis not present

## 2021-01-06 DIAGNOSIS — M62830 Muscle spasm of back: Secondary | ICD-10-CM | POA: Diagnosis not present

## 2021-01-06 DIAGNOSIS — M9905 Segmental and somatic dysfunction of pelvic region: Secondary | ICD-10-CM | POA: Diagnosis not present

## 2021-01-06 DIAGNOSIS — M5432 Sciatica, left side: Secondary | ICD-10-CM | POA: Diagnosis not present

## 2021-01-06 DIAGNOSIS — M9903 Segmental and somatic dysfunction of lumbar region: Secondary | ICD-10-CM | POA: Diagnosis not present

## 2021-01-13 DIAGNOSIS — M5432 Sciatica, left side: Secondary | ICD-10-CM | POA: Diagnosis not present

## 2021-01-13 DIAGNOSIS — M62830 Muscle spasm of back: Secondary | ICD-10-CM | POA: Diagnosis not present

## 2021-01-13 DIAGNOSIS — M9905 Segmental and somatic dysfunction of pelvic region: Secondary | ICD-10-CM | POA: Diagnosis not present

## 2021-01-13 DIAGNOSIS — M9903 Segmental and somatic dysfunction of lumbar region: Secondary | ICD-10-CM | POA: Diagnosis not present

## 2021-01-27 ENCOUNTER — Other Ambulatory Visit (INDEPENDENT_AMBULATORY_CARE_PROVIDER_SITE_OTHER): Payer: Self-pay | Admitting: Vascular Surgery

## 2021-01-27 DIAGNOSIS — M9905 Segmental and somatic dysfunction of pelvic region: Secondary | ICD-10-CM | POA: Diagnosis not present

## 2021-01-27 DIAGNOSIS — M62830 Muscle spasm of back: Secondary | ICD-10-CM | POA: Diagnosis not present

## 2021-01-27 DIAGNOSIS — I739 Peripheral vascular disease, unspecified: Secondary | ICD-10-CM

## 2021-01-27 DIAGNOSIS — M5432 Sciatica, left side: Secondary | ICD-10-CM | POA: Diagnosis not present

## 2021-01-27 DIAGNOSIS — M9903 Segmental and somatic dysfunction of lumbar region: Secondary | ICD-10-CM | POA: Diagnosis not present

## 2021-01-29 ENCOUNTER — Encounter (INDEPENDENT_AMBULATORY_CARE_PROVIDER_SITE_OTHER): Payer: Self-pay | Admitting: Nurse Practitioner

## 2021-01-29 ENCOUNTER — Encounter (INDEPENDENT_AMBULATORY_CARE_PROVIDER_SITE_OTHER): Payer: Self-pay

## 2021-02-05 ENCOUNTER — Ambulatory Visit (INDEPENDENT_AMBULATORY_CARE_PROVIDER_SITE_OTHER): Payer: Medicare HMO | Admitting: Nurse Practitioner

## 2021-02-05 ENCOUNTER — Ambulatory Visit (INDEPENDENT_AMBULATORY_CARE_PROVIDER_SITE_OTHER): Payer: Medicare HMO

## 2021-02-05 ENCOUNTER — Other Ambulatory Visit: Payer: Self-pay

## 2021-02-05 ENCOUNTER — Encounter (INDEPENDENT_AMBULATORY_CARE_PROVIDER_SITE_OTHER): Payer: Self-pay | Admitting: Nurse Practitioner

## 2021-02-05 VITALS — BP 117/66 | HR 80 | Resp 16 | Wt 172.6 lb

## 2021-02-05 DIAGNOSIS — I739 Peripheral vascular disease, unspecified: Secondary | ICD-10-CM

## 2021-02-05 DIAGNOSIS — I10 Essential (primary) hypertension: Secondary | ICD-10-CM

## 2021-02-06 ENCOUNTER — Encounter (INDEPENDENT_AMBULATORY_CARE_PROVIDER_SITE_OTHER): Payer: Self-pay | Admitting: Nurse Practitioner

## 2021-02-06 NOTE — Progress Notes (Signed)
Subjective:    Patient ID: Patrick Wilson, male    DOB: Jul 06, 1933, 87 y.o.   MRN: [id number] Chief Complaint  Patient presents with   New Patient (Initial Visit)    Ref Sabra Heck for PAD/claudication    Patrick Wilson is an 87 year old male that presents as a referral from his primary care provider Dr. Sabra Heck regarding pain in his lower extremities.  The patient describes it as having a band around his ankles.  It feels tight in this area.  He notes that this pain does not get worse with ambulation.  It is there constantly.  He denies any cramping of his calves, thighs or buttocks area when he is actually walking.  The patient notes that he is not very ambulatory.  He notes that recently he engaged in some strenuous yard work and is actually helped his legs to feel better.  He normally walks about 1.5 to 2 miles without issues and would like to work his way up to 3 miles.  He denies any open wounds or ulcerations.  He denies anything consistent with rest pain.  Today noninvasive studies show an ABI 1.48 on the right and 1.50 on the left.  A TBI of 0.94 on the right and 1.14 on the left.  He has triphasic tibial artery waveforms with good toe waveforms bilaterally.   Review of Systems  Musculoskeletal:  Positive for myalgias.  All other systems reviewed and are negative.     Objective:   Physical Exam Vitals reviewed.  HENT:     Head: Normocephalic.  Cardiovascular:     Rate and Rhythm: Normal rate.     Pulses: Normal pulses.  Pulmonary:     Effort: Pulmonary effort is normal.  Skin:    General: Skin is warm and dry.  Neurological:     Mental Status: He is alert and oriented to person, place, and time.  Psychiatric:        Mood and Affect: Mood normal.        Behavior: Behavior normal.        Thought Content: Thought content normal.        Judgment: Judgment normal.    BP 117/66 (BP Location: Right Arm)   Pulse 80   Resp 16   Wt 172 lb 9.6 oz (78.3 kg)   BMI 26.24 kg/m    Past Medical History:  Diagnosis Date   Cancer ([place])    SKIN   Coronary artery disease    Depression    GERD (gastroesophageal reflux disease)    History of kidney stones    Hypertension    Myocardial infarction [facility])    2012    Social History   Socioeconomic History   Marital status: Married    Spouse name: Not on file   Number of children: Not on file   Years of education: Not on file   Highest education level: Not on file  Occupational History   Not on file  Tobacco Use   Smoking status: Never   Smokeless tobacco: Current    Types: Chew   Tobacco comments:    May be ready to quit.  Vaping Use   Vaping Use: Never used  Substance and Sexual Activity   Alcohol use: Yes    Comment: OCCAS   Drug use: Not Currently   Sexual activity: Not on file  Other Topics Concern   Not on file  Social History Narrative   Not on file   Social  Determinants of Health   Financial Resource Strain: Not on file  Food Insecurity: Not on file  Transportation Needs: Not on file  Physical Activity: Not on file  Stress: Not on file  Social Connections: Not on file  Intimate Partner Violence: Not on file    Past Surgical History:  Procedure Laterality Date   cardiac stents     CATARACT EXTRACTION W/PHACO Left 10/24/2017   Procedure: CATARACT EXTRACTION PHACO AND INTRAOCULAR LENS PLACEMENT ([place]);  Surgeon: Birder Robson, MD;  Location: [facility] ORS;  Service: Ophthalmology;  Laterality: Left;  [place] 00:34.3 AP% 15.8 CDE 5.41 Fluid Pack lot # [id number]   CATARACT EXTRACTION W/PHACO Right 11/28/2017   Procedure: CATARACT EXTRACTION PHACO AND INTRAOCULAR LENS PLACEMENT ([facility]);  Surgeon: Birder Robson, MD;  Location: [facility] ORS;  Service: Ophthalmology;  Laterality: Right;  [place] 00:32 AP% 11.3 CDE 3.66 Fluid pack lot # [id number]   CORONARY ANGIOPLASTY     STENT   CORONARY/GRAFT ACUTE MI REVASCULARIZATION N/A 10/17/2018   Procedure: Coronary/Graft Acute MI Revascularization;  Surgeon:  Yolonda Kida, MD;  Location: [place] [facility];  Service: Cardiovascular;  Laterality: N/A;   EXTRACORPOREAL SHOCK WAVE LITHOTRIPSY Left 12/03/2015   Procedure: EXTRACORPOREAL SHOCK WAVE LITHOTRIPSY (ESWL);  Surgeon: Hollice Espy, MD;  Location: [facility] ORS;  Service: Urology;  Laterality: Left;   LEFT HEART CATH AND CORONARY ANGIOGRAPHY N/A 10/17/2018   Procedure: LEFT HEART CATH AND CORONARY ANGIOGRAPHY;  Surgeon: Yolonda Kida, MD;  Location: [place] [facility];  Service: Cardiovascular;  Laterality: N/A;    History reviewed. No pertinent family history.  Allergies  Allergen Reactions   Penicillins Rash and Other (See Comments)    Has patient had a PCN reaction causing immediate rash, facial/tongue/throat swelling, SOB or lightheadedness with hypotension: No Has patient had a PCN reaction causing severe rash involving mucus membranes or skin necrosis: No Has patient had a PCN reaction that required hospitalization No Has patient had a PCN reaction occurring within the last 10 years: No If all of the above answers are "NO", then may proceed with Cephalosporin use.    CBC Latest Ref Rng & Units 10/18/2018 10/17/2018 11/22/2015  WBC 4.0 - 10.5 K/uL 17.3(H) 20.4(H) 9.6  Hemoglobin 13.0 - 17.0 g/dL 14.1 16.0 14.3  Hematocrit 39.0 - 52.0 % 43.0 48.6 42.0  Platelets 150 - 400 K/uL 220 254 173      CMP     Component Value Date/Time   NA 141 10/18/2018 0030   NA 142 02/01/2012 0452   K 4.3 10/18/2018 0030   K 4.4 02/01/2012 0452   CL 107 10/18/2018 0030   CL 107 02/01/2012 0452   CO2 25 10/18/2018 0030   CO2 24 02/01/2012 0452   GLUCOSE 107 (H) 10/18/2018 0030   GLUCOSE 88 02/01/2012 0452   BUN 23 10/18/2018 0030   BUN 21 (H) 02/01/2012 0452   CREATININE 1.18 10/18/2018 0030   CREATININE 0.95 02/01/2012 0452   CALCIUM 8.4 (L) 10/18/2018 0030   CALCIUM 8.6 02/01/2012 0452   PROT 6.9 10/17/2018 1604   ALBUMIN 4.1 10/17/2018 1604   AST 32 10/17/2018 1604   ALT 39  10/17/2018 1604   ALKPHOS 59 10/17/2018 1604   BILITOT 0.6 10/17/2018 1604   GFRNONAA 57 (L) 10/18/2018 0030   GFRNONAA >60 02/01/2012 0452   GFRAA >60 10/18/2018 0030   GFRAA >60 02/01/2012 0452     No results found.     Assessment & Plan:   1. Claudication ([place])  Upon the patient's description of symptoms this is not true claudication.  It is more so related to musculoskeletal causes.  Patient is advised to follow-up with PCP if he continues to worsen.  2. Essential (primary) hypertension Continue antihypertensive medications as already ordered, these medications have been reviewed and there are no changes at this time.   3. PAD (peripheral artery disease) ([place]) The patient does have evidence based on his elevated ABIs of atherosclerosis.  However despite this atherosclerosis this is not flow-limiting for the patient at this time.  He currently does not have any worsening symptoms of PAD.  Patient will follow up with [place] on an as-needed basis.   Current Outpatient Medications on File Prior to Visit  Medication Sig Dispense Refill   aspirin EC 81 MG tablet Take 81 mg by mouth daily.     Denture Care Products (DENTURE ADHESIVE) CREA Apply 1 application topically daily as needed.      donepezil (ARICEPT) 10 MG tablet Take by mouth.     hydrochlorothiazide (HYDRODIURIL) 25 MG tablet Take by mouth.     meloxicam (MOBIC) 15 MG tablet Take 15 mg by mouth at bedtime.     nitroGLYCERIN (NITROSTAT) 0.4 MG SL tablet Place 0.4 mg under the tongue every 5 (five) minutes as needed for chest pain. Reported on 11/13/2015     olmesartan (BENICAR) 20 MG tablet Take by mouth.     traZODone (DESYREL) 100 MG tablet Take 100 mg by mouth at bedtime.     venlafaxine XR (EFFEXOR-XR) 37.5 MG 24 hr capsule Take 37.5 mg by mouth daily.  3   ALPRAZolam (XANAX) 0.25 MG tablet Take 0.25 mg by mouth 2 (two) times a day. (Patient not taking: Reported on 02/05/2021)     atorvastatin (LIPITOR) 80 MG tablet Take 1  tablet (80 mg total) by mouth at bedtime. 30 tablet 1   atorvastatin (LIPITOR) 80 MG tablet Take by mouth. (Patient not taking: Reported on 02/05/2021)     B Complex Vitamins (VITAMIN B COMPLEX PO) Take 1 tablet by mouth daily.  (Patient not taking: Reported on 02/05/2021)     diphenhydrAMINE (BENADRYL) 25 mg capsule Take 25 mg by mouth every 6 (six) hours as needed for allergies. (Patient not taking: Reported on 02/05/2021)     docusate sodium (COLACE) 100 MG capsule Take 1 capsule (100 mg total) by mouth 2 (two) times daily. (Patient not taking: No sig reported) 60 capsule 0   lisinopril (ZESTRIL) 2.5 MG tablet Take 1 tablet (2.5 mg total) by mouth daily. 30 tablet 1   magnesium gluconate (MAGONATE) 500 MG tablet Take 500 mg by mouth daily. (Patient not taking: Reported on 02/05/2021)     metoCLOPramide (REGLAN) 5 MG tablet Take 5 mg by mouth 2 (two) times daily. (Patient not taking: Reported on 02/05/2021)     metoprolol tartrate (LOPRESSOR) 25 MG tablet Take 0.5 tablets (12.5 mg total) by mouth every 12 (twelve) hours. 30 tablet 1   Multiple Vitamins-Minerals (BL CENTURY SENIOR PO) Take 1 tablet by mouth daily. (Patient not taking: Reported on 02/05/2021)     Omega-3 Fatty Acids (FISH OIL) 1000 MG CAPS Take 1,000 mg by mouth daily. (Patient not taking: Reported on 02/05/2021)     omeprazole (PRILOSEC) 40 MG capsule Take 40 mg by mouth 2 (two) times daily. (Patient not taking: Reported on 02/05/2021)     sucralfate (CARAFATE) 1 g tablet Take 1 g by mouth 4 (four) times daily. (Patient not taking: Reported on  02/05/2021)     tamsulosin (FLOMAX) 0.4 MG CAPS capsule Take 1 capsule (0.4 mg total) by mouth daily. (Patient not taking: Reported on 02/05/2021) 30 capsule 0   ticagrelor (BRILINTA) 90 MG TABS tablet Take by mouth. (Patient not taking: Reported on 02/05/2021)     No current facility-administered medications on file prior to visit.    There are no Patient Instructions on file for this visit. Return  if symptoms worsen or fail to improve.   Kris Hartmann, NP

## 2021-02-10 DIAGNOSIS — M62830 Muscle spasm of back: Secondary | ICD-10-CM | POA: Diagnosis not present

## 2021-02-10 DIAGNOSIS — M9903 Segmental and somatic dysfunction of lumbar region: Secondary | ICD-10-CM | POA: Diagnosis not present

## 2021-02-10 DIAGNOSIS — M9905 Segmental and somatic dysfunction of pelvic region: Secondary | ICD-10-CM | POA: Diagnosis not present

## 2021-02-10 DIAGNOSIS — M5432 Sciatica, left side: Secondary | ICD-10-CM | POA: Diagnosis not present

## 2021-02-24 DIAGNOSIS — M9905 Segmental and somatic dysfunction of pelvic region: Secondary | ICD-10-CM | POA: Diagnosis not present

## 2021-02-24 DIAGNOSIS — M9903 Segmental and somatic dysfunction of lumbar region: Secondary | ICD-10-CM | POA: Diagnosis not present

## 2021-02-24 DIAGNOSIS — M955 Acquired deformity of pelvis: Secondary | ICD-10-CM | POA: Diagnosis not present

## 2021-02-24 DIAGNOSIS — M62830 Muscle spasm of back: Secondary | ICD-10-CM | POA: Diagnosis not present

## 2021-03-10 DIAGNOSIS — M9903 Segmental and somatic dysfunction of lumbar region: Secondary | ICD-10-CM | POA: Diagnosis not present

## 2021-03-10 DIAGNOSIS — M955 Acquired deformity of pelvis: Secondary | ICD-10-CM | POA: Diagnosis not present

## 2021-03-10 DIAGNOSIS — M62830 Muscle spasm of back: Secondary | ICD-10-CM | POA: Diagnosis not present

## 2021-03-10 DIAGNOSIS — M9905 Segmental and somatic dysfunction of pelvic region: Secondary | ICD-10-CM | POA: Diagnosis not present

## 2021-03-17 DIAGNOSIS — M62830 Muscle spasm of back: Secondary | ICD-10-CM | POA: Diagnosis not present

## 2021-03-17 DIAGNOSIS — M955 Acquired deformity of pelvis: Secondary | ICD-10-CM | POA: Diagnosis not present

## 2021-03-17 DIAGNOSIS — M9905 Segmental and somatic dysfunction of pelvic region: Secondary | ICD-10-CM | POA: Diagnosis not present

## 2021-03-17 DIAGNOSIS — M9903 Segmental and somatic dysfunction of lumbar region: Secondary | ICD-10-CM | POA: Diagnosis not present

## 2021-03-24 DIAGNOSIS — M955 Acquired deformity of pelvis: Secondary | ICD-10-CM | POA: Diagnosis not present

## 2021-03-24 DIAGNOSIS — M9903 Segmental and somatic dysfunction of lumbar region: Secondary | ICD-10-CM | POA: Diagnosis not present

## 2021-03-24 DIAGNOSIS — M62830 Muscle spasm of back: Secondary | ICD-10-CM | POA: Diagnosis not present

## 2021-03-24 DIAGNOSIS — M9905 Segmental and somatic dysfunction of pelvic region: Secondary | ICD-10-CM | POA: Diagnosis not present

## 2021-04-01 DIAGNOSIS — M9903 Segmental and somatic dysfunction of lumbar region: Secondary | ICD-10-CM | POA: Diagnosis not present

## 2021-04-01 DIAGNOSIS — M955 Acquired deformity of pelvis: Secondary | ICD-10-CM | POA: Diagnosis not present

## 2021-04-01 DIAGNOSIS — M9905 Segmental and somatic dysfunction of pelvic region: Secondary | ICD-10-CM | POA: Diagnosis not present

## 2021-04-01 DIAGNOSIS — M62830 Muscle spasm of back: Secondary | ICD-10-CM | POA: Diagnosis not present

## 2021-04-14 DIAGNOSIS — M9905 Segmental and somatic dysfunction of pelvic region: Secondary | ICD-10-CM | POA: Diagnosis not present

## 2021-04-14 DIAGNOSIS — M955 Acquired deformity of pelvis: Secondary | ICD-10-CM | POA: Diagnosis not present

## 2021-04-14 DIAGNOSIS — M9903 Segmental and somatic dysfunction of lumbar region: Secondary | ICD-10-CM | POA: Diagnosis not present

## 2021-04-14 DIAGNOSIS — M62830 Muscle spasm of back: Secondary | ICD-10-CM | POA: Diagnosis not present

## 2021-04-21 ENCOUNTER — Encounter: Payer: Self-pay | Admitting: Emergency Medicine

## 2021-04-21 ENCOUNTER — Emergency Department: Payer: Medicare HMO

## 2021-04-21 ENCOUNTER — Other Ambulatory Visit: Payer: Self-pay

## 2021-04-21 DIAGNOSIS — Z7982 Long term (current) use of aspirin: Secondary | ICD-10-CM | POA: Insufficient documentation

## 2021-04-21 DIAGNOSIS — Z951 Presence of aortocoronary bypass graft: Secondary | ICD-10-CM | POA: Diagnosis not present

## 2021-04-21 DIAGNOSIS — F17220 Nicotine dependence, chewing tobacco, uncomplicated: Secondary | ICD-10-CM | POA: Diagnosis not present

## 2021-04-21 DIAGNOSIS — I2510 Atherosclerotic heart disease of native coronary artery without angina pectoris: Secondary | ICD-10-CM | POA: Insufficient documentation

## 2021-04-21 DIAGNOSIS — Z79899 Other long term (current) drug therapy: Secondary | ICD-10-CM | POA: Insufficient documentation

## 2021-04-21 DIAGNOSIS — R079 Chest pain, unspecified: Secondary | ICD-10-CM | POA: Diagnosis not present

## 2021-04-21 DIAGNOSIS — R531 Weakness: Secondary | ICD-10-CM | POA: Diagnosis not present

## 2021-04-21 DIAGNOSIS — R519 Headache, unspecified: Secondary | ICD-10-CM | POA: Insufficient documentation

## 2021-04-21 DIAGNOSIS — I10 Essential (primary) hypertension: Secondary | ICD-10-CM | POA: Insufficient documentation

## 2021-04-21 DIAGNOSIS — Z20822 Contact with and (suspected) exposure to covid-19: Secondary | ICD-10-CM | POA: Diagnosis not present

## 2021-04-21 DIAGNOSIS — I517 Cardiomegaly: Secondary | ICD-10-CM | POA: Diagnosis not present

## 2021-04-21 DIAGNOSIS — N2889 Other specified disorders of kidney and ureter: Secondary | ICD-10-CM | POA: Diagnosis not present

## 2021-04-21 DIAGNOSIS — Z85828 Personal history of other malignant neoplasm of skin: Secondary | ICD-10-CM | POA: Insufficient documentation

## 2021-04-21 DIAGNOSIS — I959 Hypotension, unspecified: Secondary | ICD-10-CM | POA: Diagnosis not present

## 2021-04-21 DIAGNOSIS — Z0389 Encounter for observation for other suspected diseases and conditions ruled out: Secondary | ICD-10-CM | POA: Diagnosis not present

## 2021-04-21 DIAGNOSIS — R0789 Other chest pain: Secondary | ICD-10-CM | POA: Diagnosis not present

## 2021-04-21 DIAGNOSIS — R0902 Hypoxemia: Secondary | ICD-10-CM | POA: Diagnosis not present

## 2021-04-21 DIAGNOSIS — I700 Atherosclerosis of aorta: Secondary | ICD-10-CM | POA: Diagnosis not present

## 2021-04-21 LAB — BASIC METABOLIC PANEL
Anion gap: 9 (ref 5–15)
BUN: 20 mg/dL (ref 8–23)
CO2: 22 mmol/L (ref 22–32)
Calcium: 8.6 mg/dL — ABNORMAL LOW (ref 8.9–10.3)
Chloride: 107 mmol/L (ref 98–111)
Creatinine, Ser: 1.19 mg/dL (ref 0.61–1.24)
GFR, Estimated: 60 mL/min — ABNORMAL LOW (ref >60)
Glucose, Bld: 122 mg/dL — ABNORMAL HIGH (ref 70–99)
Potassium: 4.2 mmol/L (ref 3.5–5.1)
Sodium: 138 mmol/L (ref 135–145)

## 2021-04-21 LAB — CBC
HCT: 47.8 % (ref 39.0–52.0)
Hemoglobin: 16.5 g/dL (ref 13.0–17.0)
MCH: 31.5 pg (ref 26.0–34.0)
MCHC: 34.5 g/dL (ref 30.0–36.0)
MCV: 91.4 fL (ref 80.0–100.0)
Platelets: 280 10*3/uL (ref 150–400)
RBC: 5.23 MIL/uL (ref 4.22–5.81)
RDW: 12.6 % (ref 11.5–15.5)
WBC: 16.8 10*3/uL — ABNORMAL HIGH (ref 4.0–10.5)
nRBC: 0 % (ref 0.0–0.2)

## 2021-04-21 LAB — TROPONIN I (HIGH SENSITIVITY): Troponin I (High Sensitivity): 14 ng/L (ref <18)

## 2021-04-21 MED ORDER — ONDANSETRON 4 MG PO TBDP
4.0000 mg | ORAL_TABLET | Freq: Once | ORAL | Status: AC
  Administered 2021-04-21: 21:00:00 4 mg via ORAL
  Filled 2021-04-21: qty 1

## 2021-04-21 NOTE — ED Triage Notes (Signed)
EMS brings pt in from home for c/o substernal CP & weakness; V x 2; NTG taken at home PTA (med expired)

## 2021-04-22 ENCOUNTER — Emergency Department: Payer: Medicare HMO

## 2021-04-22 ENCOUNTER — Emergency Department
Admission: EM | Admit: 2021-04-22 | Discharge: 2021-04-22 | Disposition: A | Discharge status: Home / Self Care | Payer: Medicare HMO | Attending: Emergency Medicine | Admitting: Emergency Medicine

## 2021-04-22 DIAGNOSIS — R079 Chest pain, unspecified: Secondary | ICD-10-CM | POA: Diagnosis not present

## 2021-04-22 DIAGNOSIS — I517 Cardiomegaly: Secondary | ICD-10-CM | POA: Diagnosis not present

## 2021-04-22 DIAGNOSIS — Z0389 Encounter for observation for other suspected diseases and conditions ruled out: Secondary | ICD-10-CM | POA: Diagnosis not present

## 2021-04-22 DIAGNOSIS — R519 Headache, unspecified: Secondary | ICD-10-CM | POA: Diagnosis not present

## 2021-04-22 DIAGNOSIS — N2889 Other specified disorders of kidney and ureter: Secondary | ICD-10-CM | POA: Diagnosis not present

## 2021-04-22 LAB — HEPATIC FUNCTION PANEL
ALT: 13 U/L (ref 0–44)
AST: 19 U/L (ref 15–41)
Albumin: 3.7 g/dL (ref 3.5–5.0)
Alkaline Phosphatase: 71 U/L (ref 38–126)
Bilirubin, Direct: 0.2 mg/dL (ref 0.0–0.2)
Indirect Bilirubin: 0.7 mg/dL (ref 0.3–0.9)
Total Bilirubin: 0.9 mg/dL (ref 0.3–1.2)
Total Protein: 6.6 g/dL (ref 6.5–8.1)

## 2021-04-22 LAB — RESP PANEL BY RT-PCR (FLU A&B, COVID) ARPGX2
Influenza A by PCR: NEGATIVE
Influenza B by PCR: NEGATIVE
SARS Coronavirus 2 by RT PCR: NEGATIVE

## 2021-04-22 LAB — LIPASE, BLOOD: Lipase: 27 U/L (ref 11–51)

## 2021-04-22 LAB — TROPONIN I (HIGH SENSITIVITY)
Troponin I (High Sensitivity): 12 ng/L (ref <18)
Troponin I (High Sensitivity): 14 ng/L (ref <18)

## 2021-04-22 LAB — PROCALCITONIN: Procalcitonin: 0.18 ng/mL

## 2021-04-22 MED ORDER — IOHEXOL 350 MG/ML SOLN
75.0000 mL | Freq: Once | INTRAVENOUS | Status: AC | PRN
  Administered 2021-04-22: 08:00:00 75 mL via INTRAVENOUS
  Filled 2021-04-22: qty 75

## 2021-04-22 NOTE — Discharge Instructions (Addendum)
Your work-up is very reassuring without evidence of heart attack or blood clots however you are high risk.  We discussed admission for cardiac monitoring versus going home and return if you develop any chest pain.  Given your chest pain-free you are going to go home but please return if the chest pain comes back otherwise you should follow-up with the cardiologist as below.  Return to the ER for for chest pain, fevers or any other concerns  Tuesday 12/27 @ 11:15am with Dr Saralyn Pilar

## 2021-04-22 NOTE — ED Notes (Signed)
Pt verbalizes understanding of d/c instructions, medications and follow up 

## 2021-04-22 NOTE — ED Notes (Signed)
Pt reports CP that was stabbing in nature to his mid chest, non radiating. Pt reports slight sob with it. Pt reports hx of MI so he was concerned. Pt states no pain at this time.

## 2021-04-22 NOTE — ED Provider Notes (Signed)
[facility] Emergency Department Provider Note  ____________________________________________   Event Date/Time   First MD Initiated Contact with Patient 04/22/21 [phone]     (approximate)  I have reviewed the triage vital signs and the nursing notes.   HISTORY  Chief Complaint Chest Pain    HPI Patrick Wilson is a 87 y.o. male with history of MI, coronary disease who comes in with concerns for chest pain.  Patient reports around 4 PM yesterday he had sudden onset stabbing chest pain in the middle of his chest.  Denies any abdominal pain.  He later did have an episode of vomiting and diarrhea as well as a headache and some generalized weakness.Marland Kitchen  He reported the pain lasted for a few hours and then gradually went away it was resolved with some nitro.  He had a stress test previously in 2020 and ended up having a STEMI and had to go to a cath for an urgent stent.  He denies any known shortness of breath, cough, fevers or other concerns.  He states that his symptoms are now all resolved and that he is ready to go home.  He does report a headache as well that happened with the chest pain.  He also reports that his son was recently sick.  Son reports that he was sick with an episode of vomiting and diarrhea.  This was just a few days ago.  Lasted about 24 hours.          Past Medical History:  Diagnosis Date   Cancer [facility])    SKIN   Coronary artery disease    Depression    GERD (gastroesophageal reflux disease)    History of kidney stones    Hypertension    Myocardial infarction [facility])    2012    Patient Active Problem List   Diagnosis Date Noted   STEMI involving right coronary artery ([place]) 10/17/2018   STEMI (ST elevation myocardial infarction) ([place]) 10/17/2018   Ureteral calculus 11/24/2015   Arterial vascular disease 11/13/2015   Calculus of kidney 11/13/2015   Cervical disc disease 03/05/2014   Combined fat and carbohydrate induced hyperlipemia  03/05/2014   Essential (primary) hypertension 03/05/2014   History of cardiac catheterization 01/02/2014   ED (erectile dysfunction) of organic origin 03/04/2013   H/O urinary stone 03/04/2013    Past Surgical History:  Procedure Laterality Date   cardiac stents     CATARACT EXTRACTION W/PHACO Left 10/24/2017   Procedure: CATARACT EXTRACTION PHACO AND INTRAOCULAR LENS PLACEMENT ([place]);  Surgeon: Birder Robson, MD;  Location: [facility] ORS;  Service: Ophthalmology;  Laterality: Left;  [place] 00:34.3 AP% 15.8 CDE 5.41 Fluid Pack lot # [id number]   CATARACT EXTRACTION W/PHACO Right 11/28/2017   Procedure: CATARACT EXTRACTION PHACO AND INTRAOCULAR LENS PLACEMENT ([facility]);  Surgeon: Birder Robson, MD;  Location: [facility] ORS;  Service: Ophthalmology;  Laterality: Right;  [place] 00:32 AP% 11.3 CDE 3.66 Fluid pack lot # [id number]   CORONARY ANGIOPLASTY     STENT   CORONARY/GRAFT ACUTE MI REVASCULARIZATION N/A 10/17/2018   Procedure: Coronary/Graft Acute MI Revascularization;  Surgeon: Yolonda Kida, MD;  Location: [place] CV LAB;  Service: Cardiovascular;  Laterality: N/A;   EXTRACORPOREAL SHOCK WAVE LITHOTRIPSY Left 12/03/2015   Procedure: EXTRACORPOREAL SHOCK WAVE LITHOTRIPSY (ESWL);  Surgeon: Hollice Espy, MD;  Location: [facility] ORS;  Service: Urology;  Laterality: Left;   LEFT HEART CATH AND CORONARY ANGIOGRAPHY N/A 10/17/2018   Procedure: LEFT HEART CATH AND CORONARY ANGIOGRAPHY;  Surgeon: Yolonda Kida, MD;  Location: [place] CV LAB;  Service: Cardiovascular;  Laterality: N/A;    Prior to Admission medications   Medication Sig Start Date End Date Taking? Authorizing Provider  ALPRAZolam (XANAX) 0.25 MG tablet Take 0.25 mg by mouth 2 (two) times a day. Patient not taking: Reported on 02/05/2021 10/15/18   [provider]  aspirin EC 81 MG tablet Take 81 mg by mouth daily.    [provider]  atorvastatin (LIPITOR) 80 MG tablet Take 1 tablet (80 mg total) by mouth at  bedtime. 10/18/18 12/17/18  Henreitta Leber, MD  atorvastatin (LIPITOR) 80 MG tablet Take by mouth. Patient not taking: Reported on 02/05/2021    [provider]  B Complex Vitamins (VITAMIN B COMPLEX PO) Take 1 tablet by mouth daily.  Patient not taking: Reported on 02/05/2021 08/10/15   [provider]  Denture Care Products (DENTURE ADHESIVE) CREA Apply 1 application topically daily as needed.  08/10/15   [provider]  diphenhydrAMINE (BENADRYL) 25 mg capsule Take 25 mg by mouth every 6 (six) hours as needed for allergies. Patient not taking: Reported on 02/05/2021    [provider]  docusate sodium (COLACE) 100 MG capsule Take 1 capsule (100 mg total) by mouth 2 (two) times daily. Patient not taking: No sig reported 12/03/15   Hollice Espy, MD  donepezil (ARICEPT) 10 MG tablet Take by mouth. 12/31/18 02/05/21  [provider]  hydrochlorothiazide (HYDRODIURIL) 25 MG tablet Take by mouth. 01/05/21   [provider]  lisinopril (ZESTRIL) 2.5 MG tablet Take 1 tablet (2.5 mg total) by mouth daily. 10/19/18 12/18/18  Henreitta Leber, MD  magnesium gluconate (MAGONATE) 500 MG tablet Take 500 mg by mouth daily. Patient not taking: Reported on 02/05/2021    [provider]  meloxicam (MOBIC) 15 MG tablet Take 15 mg by mouth at bedtime.    [provider]  metoCLOPramide (REGLAN) 5 MG tablet Take 5 mg by mouth 2 (two) times daily. Patient not taking: Reported on 02/05/2021 10/03/18   [provider]  metoprolol tartrate (LOPRESSOR) 25 MG tablet Take 0.5 tablets (12.5 mg total) by mouth every 12 (twelve) hours. 10/18/18 12/17/18  Henreitta Leber, MD  Multiple Vitamins-Minerals (BL CENTURY SENIOR PO) Take 1 tablet by mouth daily. Patient not taking: Reported on 02/05/2021    [provider]  nitroGLYCERIN (NITROSTAT) 0.4 MG SL tablet Place 0.4 mg under the tongue every 5 (five) minutes as needed for chest pain. Reported on  11/13/2015    [provider]  olmesartan (BENICAR) 20 MG tablet Take by mouth. 12/31/18 02/05/21  [provider]  Omega-3 Fatty Acids (FISH OIL) 1000 MG CAPS Take 1,000 mg by mouth daily. Patient not taking: Reported on 02/05/2021    [provider]  omeprazole (PRILOSEC) 40 MG capsule Take 40 mg by mouth 2 (two) times daily. Patient not taking: Reported on 02/05/2021 10/01/18   [provider]  sucralfate (CARAFATE) 1 g tablet Take 1 g by mouth 4 (four) times daily. Patient not taking: Reported on 02/05/2021 10/10/18   [provider]  tamsulosin (FLOMAX) 0.4 MG CAPS capsule Take 1 capsule (0.4 mg total) by mouth daily. Patient not taking: Reported on 02/05/2021 12/03/15   Hollice Espy, MD  ticagrelor (BRILINTA) 90 MG TABS tablet Take by mouth. Patient not taking: Reported on 02/05/2021 12/14/18   [provider]  traZODone (DESYREL) 100 MG tablet Take 100 mg by mouth at  bedtime. 06/25/18   [provider]  venlafaxine XR (EFFEXOR-XR) 37.5 MG 24 hr capsule Take 37.5 mg by mouth daily. 09/04/17   [provider]    Allergies Penicillins  No family history on file.  Social History Social History   Tobacco Use   Smoking status: Never   Smokeless tobacco: Current    Types: Chew   Tobacco comments:    May be ready to quit.  Vaping Use   Vaping Use: Never used  Substance Use Topics   Alcohol use: Yes    Comment: OCCAS   Drug use: Not Currently      Review of Systems Constitutional: No fever/chills, generalized weakness now resolved Eyes: No visual changes. ENT: No sore throat. Cardiovascular: Positive chest pain Respiratory: Denies shortness of breath. Gastrointestinal: No abdominal pain.  +1 episode of vomiting and diarrhea.   Genitourinary: Negative for dysuria. Musculoskeletal: Negative for back pain. Skin: Negative for rash. Neurological: Positive headache now resolved, no focal weakness or numbness. All  other ROS negative ____________________________________________   PHYSICAL EXAM:  VITAL SIGNS: ED Triage Vitals  Enc Vitals Group     BP 04/21/21 2033 104/71     Pulse Rate 04/21/21 2033 (!) 106     Resp 04/21/21 2033 18     Temp 04/21/21 2033 98.2 F (36.8 C)     Temp Source 04/21/21 2033 Oral     SpO2 04/21/21 2033 95 %     Weight 04/21/21 2033 167 lb (75.8 kg)     Height 04/21/21 2033 5\' 8"  (1.727 m)     Head Circumference --      Peak Flow --      Pain Score 04/21/21 2036 1     Pain Loc --      Pain Edu? --      Excl. in [place]? --     Constitutional: Alert and oriented. Well appearing and in no acute distress. Eyes: Conjunctivae are normal. EOMI. Head: Atraumatic. Nose: No congestion/rhinnorhea. Mouth/Throat: Mucous membranes are moist.   Neck: No stridor. Trachea Midline. FROM Cardiovascular: Normal rate, regular rhythm. Grossly normal heart sounds.  Good peripheral circulation. Respiratory: Normal respiratory effort.  No retractions. Lungs CTAB. Gastrointestinal: Soft and nontender. No distention. No abdominal bruits.  Musculoskeletal: No lower extremity tenderness nor edema.  No joint effusions. Neurologic:  Normal speech and language. No gross focal neurologic deficits are appreciated.  Skin:  Skin is warm, dry and intact. No rash noted. Psychiatric: Mood and affect are normal. Speech and behavior are normal. GU: Deferred   ____________________________________________   LABS (all labs ordered are listed, but only abnormal results are displayed)  Labs Reviewed  BASIC METABOLIC PANEL - Abnormal; Notable for the following components:      Result Value   Glucose, Bld 122 (*)    Calcium 8.6 (*)    GFR, Estimated 60 (*)    All other components within normal limits  CBC - Abnormal; Notable for the following components:   WBC 16.8 (*)    All other components within normal limits  RESP PANEL BY RT-PCR (FLU A&B, COVID) ARPGX2  PROCALCITONIN  HEPATIC FUNCTION PANEL   LIPASE, BLOOD  TROPONIN I (HIGH SENSITIVITY)  TROPONIN I (HIGH SENSITIVITY)  TROPONIN I (HIGH SENSITIVITY)   ____________________________________________   ED ECG REPORT I, Vanessa [place], the attending physician, personally viewed and interpreted this ECG.  Sinus tachycardia rate of 106, no ST elevation, T wave version in lead III, normal intervals ____________________________________________  RADIOLOGY I, Vanessa Shoshoni, personally viewed and evaluated these images (plain radiographs) as part of my medical decision making, as well as reviewing the written report by the radiologist.  ED MD interpretation: There was concern for questionable gas density but a repeat abdominal x-ray shows no evidence of free air  Official radiology report(s): DG Chest 2 View  Result Date: 04/21/2021 CLINICAL DATA:  Chest pain.  Weakness. EXAM: CHEST - 2 VIEW COMPARISON:  10/17/2018 FINDINGS: Ambiguous appearance for free intraperitoneal gas along the left hemidiaphragm, more suggestive on the frontal projection than the lateral. I would suggest a left side down lateral decubitus view of the abdomen for further characterization. Atherosclerotic calcification of the aortic arch. Art size within normal limits. Faint density projecting over left mid lung probably relates to the known sclerotic lesion of the left anterior fourth rib shown on prior CT. The lungs appear otherwise clear. Heart size within normal limits for projection. IMPRESSION: 1. Ambiguous appearance for free intraperitoneal gas, with questionable gas density along the left hemidiaphragm on the frontal projection but less convincing on the lateral projection. Given that the patient's symptoms are not really abdominal, I recommend a left side down lateral decubitus view of the abdomen to rule out free intraperitoneal gas. 2. Sclerotic lesion the left anterior fourth rib as shown on prior CT. Electronically Signed   By: Van Clines M.D.   On:  04/21/2021 21:03   DG Abdomen 1 View  Result Date: 04/22/2021 CLINICAL DATA:  Rule out free air. EXAM: ABDOMEN - 1 VIEW COMPARISON:  January 15, 2016 FINDINGS: The bowel gas pattern is normal. No free air is identified. A 3 mm linear soft tissue calcification is seen projecting over the expected region of the left kidney. IMPRESSION: 1. No evidence of bowel obstruction or free air. 2. Possible 3 mm left renal calculus. Electronically Signed   By: Virgina Norfolk M.D.   On: 04/22/2021 02:45    ____________________________________________   PROCEDURES  Procedure(s) performed (including Critical Care):  Procedures   ____________________________________________   INITIAL IMPRESSION / ASSESSMENT AND PLAN / ED COURSE   Luisa Dago was evaluated in Emergency Department on 04/22/2021 for the symptoms described in the history of present illness. He was evaluated in the context of the global COVID-19 pandemic, which necessitated consideration that the patient might be at risk for infection with the SARS-CoV-2 virus that causes COVID-19. Institutional protocols and algorithms that pertain to the evaluation of patients at risk for COVID-19 are in a state of rapid change based on information released by regulatory bodies including the CDC and federal and state organizations. These policies and algorithms were followed during the patient's care in the ED.    Most Likely DDx:  -ACS given the patient's symptoms versus GI bug versus COVID, versus flu.  Given patient's initial tachycardia will get a CT PE to evaluate for pulmonary embolism and a CT head given the headache and the weakness to make sure no evidence of any intracranial hemorrhage.  Abdomen is soft and nontender low suspicion for acute abdominal pathology.  There was a kidney stone noted on the x-ray.  Patient reports a history of kidney stones but denies any pains to suggest kidney stone issues   DDx that was also considered d/t  potential to cause harm, but was found less likely based on history and physical (as detailed above): -PNA (no fevers, cough but CXR to evaluate) -PNX (reassured with equal b/l breath sounds, CXR to evaluate) -Symptomatic  anemia (will get H&H) -Aortic Dissection as no tearing pain and no radiation to the mid back, pulses equal -Pericarditis no rub on exam, EKG changes or hx to suggest dx -Tamponade (no notable SOB, tachycardic, hypotensive) -Esophageal rupture (no h/o diffuse vomitting/no crepitus)   Blood work is all reassuring.  Cardiac markers have been negative x3 patient's been here for over 14 hours.  COVID and flu were negative.  CT PE was also negative without any evidence of pneumonia.  White count slightly elevated but denies any infectious symptoms and procalcitonin is essentially negative therefore we will hold off on any antibiotics.  Patient reports feeling at his baseline self.  I suspect that he could have had a GI bug given the multiple symptoms but given the chest pain did resolve with nitro I did discuss my concern for cardiac pathology especially given his extensive history.  I offered admission for cardiac monitoring, stress test inpatient but patient would really like to go home.  He understands however if he develops return of chest pain that he is high risk and can have a future event needs to return the ER immediately.  He understands this.  I did talk to the cardiology team and got him an appointment for 12/27 with his cardiology doctor.  He expressed understanding felt comfortable with this plan.  His son is at bedside will be within the next few days and will monitor him and get him back if he develops any return of symptoms.  I discussed the provisional nature of ED diagnosis, the treatment so far, the ongoing plan of care, follow up appointments and return precautions with the patient and any family or support people present. They expressed understanding and agreed with the  plan, discharged home.         ____________________________________________   FINAL CLINICAL IMPRESSION(S) / ED DIAGNOSES   Final diagnoses:  Chest pain, unspecified type     MEDICATIONS GIVEN DURING THIS VISIT:  Medications  ondansetron (ZOFRAN-ODT) disintegrating tablet 4 mg (4 mg Oral Given 04/21/21 2042)  iohexol (OMNIPAQUE) 350 MG/ML injection 75 mL (75 mLs Intravenous Contrast Given 04/22/21 0818)     ED Discharge Orders     None        Note:  This document was prepared using Dragon voice recognition software and may include unintentional dictation errors.    Vanessa Craig, MD 04/22/21 [phone]

## 2021-04-27 DIAGNOSIS — E782 Mixed hyperlipidemia: Secondary | ICD-10-CM | POA: Diagnosis not present

## 2021-04-27 DIAGNOSIS — Z23 Encounter for immunization: Secondary | ICD-10-CM | POA: Diagnosis not present

## 2021-04-27 DIAGNOSIS — I2510 Atherosclerotic heart disease of native coronary artery without angina pectoris: Secondary | ICD-10-CM | POA: Diagnosis not present

## 2021-04-27 DIAGNOSIS — I10 Essential (primary) hypertension: Secondary | ICD-10-CM | POA: Diagnosis not present

## 2021-04-28 DIAGNOSIS — M62830 Muscle spasm of back: Secondary | ICD-10-CM | POA: Diagnosis not present

## 2021-04-28 DIAGNOSIS — M9905 Segmental and somatic dysfunction of pelvic region: Secondary | ICD-10-CM | POA: Diagnosis not present

## 2021-04-28 DIAGNOSIS — M955 Acquired deformity of pelvis: Secondary | ICD-10-CM | POA: Diagnosis not present

## 2021-04-28 DIAGNOSIS — M9903 Segmental and somatic dysfunction of lumbar region: Secondary | ICD-10-CM | POA: Diagnosis not present

## 2021-05-05 DIAGNOSIS — M9903 Segmental and somatic dysfunction of lumbar region: Secondary | ICD-10-CM | POA: Diagnosis not present

## 2021-05-05 DIAGNOSIS — M9905 Segmental and somatic dysfunction of pelvic region: Secondary | ICD-10-CM | POA: Diagnosis not present

## 2021-05-05 DIAGNOSIS — A084 Viral intestinal infection, unspecified: Secondary | ICD-10-CM | POA: Diagnosis not present

## 2021-05-05 DIAGNOSIS — M955 Acquired deformity of pelvis: Secondary | ICD-10-CM | POA: Diagnosis not present

## 2021-05-05 DIAGNOSIS — M62830 Muscle spasm of back: Secondary | ICD-10-CM | POA: Diagnosis not present

## 2021-05-05 DIAGNOSIS — R5383 Other fatigue: Secondary | ICD-10-CM | POA: Diagnosis not present

## 2021-05-05 DIAGNOSIS — R079 Chest pain, unspecified: Secondary | ICD-10-CM | POA: Diagnosis not present

## 2021-05-11 DIAGNOSIS — J309 Allergic rhinitis, unspecified: Secondary | ICD-10-CM | POA: Diagnosis not present

## 2021-05-11 DIAGNOSIS — J321 Chronic frontal sinusitis: Secondary | ICD-10-CM | POA: Diagnosis not present

## 2021-05-11 DIAGNOSIS — R0981 Nasal congestion: Secondary | ICD-10-CM | POA: Diagnosis not present

## 2021-05-12 ENCOUNTER — Other Ambulatory Visit: Payer: Self-pay | Admitting: Unknown Physician Specialty

## 2021-05-12 DIAGNOSIS — M955 Acquired deformity of pelvis: Secondary | ICD-10-CM | POA: Diagnosis not present

## 2021-05-12 DIAGNOSIS — M9905 Segmental and somatic dysfunction of pelvic region: Secondary | ICD-10-CM | POA: Diagnosis not present

## 2021-05-12 DIAGNOSIS — J301 Allergic rhinitis due to pollen: Secondary | ICD-10-CM | POA: Diagnosis not present

## 2021-05-12 DIAGNOSIS — M9903 Segmental and somatic dysfunction of lumbar region: Secondary | ICD-10-CM | POA: Diagnosis not present

## 2021-05-12 DIAGNOSIS — J329 Chronic sinusitis, unspecified: Secondary | ICD-10-CM

## 2021-05-12 DIAGNOSIS — M62830 Muscle spasm of back: Secondary | ICD-10-CM | POA: Diagnosis not present

## 2021-05-19 ENCOUNTER — Ambulatory Visit
Admission: RE | Admit: 2021-05-19 | Discharge: 2021-05-19 | Disposition: A | Discharge status: Home / Self Care | Payer: Medicare HMO | Source: Ambulatory Visit | Attending: Unknown Physician Specialty | Admitting: Unknown Physician Specialty

## 2021-05-19 DIAGNOSIS — J329 Chronic sinusitis, unspecified: Secondary | ICD-10-CM

## 2021-05-19 DIAGNOSIS — R519 Headache, unspecified: Secondary | ICD-10-CM | POA: Diagnosis not present

## 2021-05-19 DIAGNOSIS — H5713 Ocular pain, bilateral: Secondary | ICD-10-CM | POA: Diagnosis not present

## 2021-05-21 DIAGNOSIS — I10 Essential (primary) hypertension: Secondary | ICD-10-CM | POA: Diagnosis not present

## 2021-05-21 DIAGNOSIS — F03A0 Unspecified dementia, mild, without behavioral disturbance, psychotic disturbance, mood disturbance, and anxiety: Secondary | ICD-10-CM | POA: Diagnosis not present

## 2021-05-21 DIAGNOSIS — I739 Peripheral vascular disease, unspecified: Secondary | ICD-10-CM | POA: Diagnosis not present

## 2021-05-26 DIAGNOSIS — M955 Acquired deformity of pelvis: Secondary | ICD-10-CM | POA: Diagnosis not present

## 2021-05-26 DIAGNOSIS — M9905 Segmental and somatic dysfunction of pelvic region: Secondary | ICD-10-CM | POA: Diagnosis not present

## 2021-05-26 DIAGNOSIS — M62830 Muscle spasm of back: Secondary | ICD-10-CM | POA: Diagnosis not present

## 2021-05-26 DIAGNOSIS — M9903 Segmental and somatic dysfunction of lumbar region: Secondary | ICD-10-CM | POA: Diagnosis not present

## 2021-05-28 DIAGNOSIS — X32XXXA Exposure to sunlight, initial encounter: Secondary | ICD-10-CM | POA: Diagnosis not present

## 2021-05-28 DIAGNOSIS — Z08 Encounter for follow-up examination after completed treatment for malignant neoplasm: Secondary | ICD-10-CM | POA: Diagnosis not present

## 2021-05-28 DIAGNOSIS — D225 Melanocytic nevi of trunk: Secondary | ICD-10-CM | POA: Diagnosis not present

## 2021-05-28 DIAGNOSIS — L570 Actinic keratosis: Secondary | ICD-10-CM | POA: Diagnosis not present

## 2021-05-28 DIAGNOSIS — L821 Other seborrheic keratosis: Secondary | ICD-10-CM | POA: Diagnosis not present

## 2021-05-28 DIAGNOSIS — Z85828 Personal history of other malignant neoplasm of skin: Secondary | ICD-10-CM | POA: Diagnosis not present

## 2021-06-09 DIAGNOSIS — M62830 Muscle spasm of back: Secondary | ICD-10-CM | POA: Diagnosis not present

## 2021-06-09 DIAGNOSIS — M955 Acquired deformity of pelvis: Secondary | ICD-10-CM | POA: Diagnosis not present

## 2021-06-09 DIAGNOSIS — M9905 Segmental and somatic dysfunction of pelvic region: Secondary | ICD-10-CM | POA: Diagnosis not present

## 2021-06-09 DIAGNOSIS — M9903 Segmental and somatic dysfunction of lumbar region: Secondary | ICD-10-CM | POA: Diagnosis not present

## 2021-06-23 DIAGNOSIS — M9903 Segmental and somatic dysfunction of lumbar region: Secondary | ICD-10-CM | POA: Diagnosis not present

## 2021-06-23 DIAGNOSIS — M9905 Segmental and somatic dysfunction of pelvic region: Secondary | ICD-10-CM | POA: Diagnosis not present

## 2021-06-23 DIAGNOSIS — M955 Acquired deformity of pelvis: Secondary | ICD-10-CM | POA: Diagnosis not present

## 2021-06-23 DIAGNOSIS — M62830 Muscle spasm of back: Secondary | ICD-10-CM | POA: Diagnosis not present

## 2021-07-01 DIAGNOSIS — Z125 Encounter for screening for malignant neoplasm of prostate: Secondary | ICD-10-CM | POA: Diagnosis not present

## 2021-07-01 DIAGNOSIS — E538 Deficiency of other specified B group vitamins: Secondary | ICD-10-CM | POA: Diagnosis not present

## 2021-07-01 DIAGNOSIS — E782 Mixed hyperlipidemia: Secondary | ICD-10-CM | POA: Diagnosis not present

## 2021-07-07 DIAGNOSIS — M62830 Muscle spasm of back: Secondary | ICD-10-CM | POA: Diagnosis not present

## 2021-07-07 DIAGNOSIS — M9903 Segmental and somatic dysfunction of lumbar region: Secondary | ICD-10-CM | POA: Diagnosis not present

## 2021-07-07 DIAGNOSIS — M955 Acquired deformity of pelvis: Secondary | ICD-10-CM | POA: Diagnosis not present

## 2021-07-07 DIAGNOSIS — M9905 Segmental and somatic dysfunction of pelvic region: Secondary | ICD-10-CM | POA: Diagnosis not present

## 2021-07-21 DIAGNOSIS — M9903 Segmental and somatic dysfunction of lumbar region: Secondary | ICD-10-CM | POA: Diagnosis not present

## 2021-07-21 DIAGNOSIS — M9905 Segmental and somatic dysfunction of pelvic region: Secondary | ICD-10-CM | POA: Diagnosis not present

## 2021-07-21 DIAGNOSIS — M955 Acquired deformity of pelvis: Secondary | ICD-10-CM | POA: Diagnosis not present

## 2021-07-21 DIAGNOSIS — M62830 Muscle spasm of back: Secondary | ICD-10-CM | POA: Diagnosis not present

## 2021-07-23 DIAGNOSIS — I739 Peripheral vascular disease, unspecified: Secondary | ICD-10-CM | POA: Diagnosis not present

## 2021-07-23 DIAGNOSIS — F0390 Unspecified dementia without behavioral disturbance: Secondary | ICD-10-CM | POA: Diagnosis not present

## 2021-07-23 DIAGNOSIS — Z0000 Encounter for general adult medical examination without abnormal findings: Secondary | ICD-10-CM | POA: Diagnosis not present

## 2021-07-23 DIAGNOSIS — N179 Acute kidney failure, unspecified: Secondary | ICD-10-CM | POA: Diagnosis not present

## 2021-07-23 DIAGNOSIS — Z1389 Encounter for screening for other disorder: Secondary | ICD-10-CM | POA: Diagnosis not present

## 2021-07-23 DIAGNOSIS — F32A Depression, unspecified: Secondary | ICD-10-CM | POA: Diagnosis not present

## 2021-07-23 DIAGNOSIS — F0670 Mild neurocognitive disorder due to known physiological condition without behavioral disturbance: Secondary | ICD-10-CM | POA: Insufficient documentation

## 2021-07-23 DIAGNOSIS — N289 Disorder of kidney and ureter, unspecified: Secondary | ICD-10-CM | POA: Diagnosis not present

## 2021-07-23 DIAGNOSIS — F330 Major depressive disorder, recurrent, mild: Secondary | ICD-10-CM | POA: Insufficient documentation

## 2021-07-23 DIAGNOSIS — L570 Actinic keratosis: Secondary | ICD-10-CM | POA: Diagnosis not present

## 2021-07-26 DIAGNOSIS — I2510 Atherosclerotic heart disease of native coronary artery without angina pectoris: Secondary | ICD-10-CM | POA: Diagnosis not present

## 2021-07-26 DIAGNOSIS — I2119 ST elevation (STEMI) myocardial infarction involving other coronary artery of inferior wall: Secondary | ICD-10-CM | POA: Diagnosis not present

## 2021-07-26 DIAGNOSIS — E782 Mixed hyperlipidemia: Secondary | ICD-10-CM | POA: Diagnosis not present

## 2021-07-26 DIAGNOSIS — Z9889 Other specified postprocedural states: Secondary | ICD-10-CM | POA: Diagnosis not present

## 2021-07-26 DIAGNOSIS — I739 Peripheral vascular disease, unspecified: Secondary | ICD-10-CM | POA: Diagnosis not present

## 2021-08-04 DIAGNOSIS — M9903 Segmental and somatic dysfunction of lumbar region: Secondary | ICD-10-CM | POA: Diagnosis not present

## 2021-08-04 DIAGNOSIS — M955 Acquired deformity of pelvis: Secondary | ICD-10-CM | POA: Diagnosis not present

## 2021-08-04 DIAGNOSIS — M62830 Muscle spasm of back: Secondary | ICD-10-CM | POA: Diagnosis not present

## 2021-08-04 DIAGNOSIS — M9905 Segmental and somatic dysfunction of pelvic region: Secondary | ICD-10-CM | POA: Diagnosis not present

## 2021-08-09 ENCOUNTER — Encounter: Payer: Self-pay | Admitting: Podiatry

## 2021-08-09 ENCOUNTER — Ambulatory Visit: Payer: Medicare HMO | Admitting: Podiatry

## 2021-08-09 ENCOUNTER — Ambulatory Visit (INDEPENDENT_AMBULATORY_CARE_PROVIDER_SITE_OTHER): Payer: Medicare HMO

## 2021-08-09 DIAGNOSIS — M205X2 Other deformities of toe(s) (acquired), left foot: Secondary | ICD-10-CM | POA: Diagnosis not present

## 2021-08-09 DIAGNOSIS — M205X9 Other deformities of toe(s) (acquired), unspecified foot: Secondary | ICD-10-CM

## 2021-08-09 DIAGNOSIS — M205X1 Other deformities of toe(s) (acquired), right foot: Secondary | ICD-10-CM

## 2021-08-09 DIAGNOSIS — G5791 Unspecified mononeuropathy of right lower limb: Secondary | ICD-10-CM | POA: Diagnosis not present

## 2021-08-09 DIAGNOSIS — M778 Other enthesopathies, not elsewhere classified: Secondary | ICD-10-CM

## 2021-08-09 MED ORDER — DEXAMETHASONE SODIUM PHOSPHATE 120 MG/30ML IJ SOLN
2.0000 mg | Freq: Once | INTRAMUSCULAR | Status: AC
  Administered 2021-08-09: 2 mg via INTRA_ARTICULAR
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
## 2021-08-18 DIAGNOSIS — M9905 Segmental and somatic dysfunction of pelvic region: Secondary | ICD-10-CM | POA: Diagnosis not present

## 2021-08-18 DIAGNOSIS — M955 Acquired deformity of pelvis: Secondary | ICD-10-CM | POA: Diagnosis not present

## 2021-08-18 DIAGNOSIS — M9903 Segmental and somatic dysfunction of lumbar region: Secondary | ICD-10-CM | POA: Diagnosis not present

## 2021-08-18 DIAGNOSIS — M62830 Muscle spasm of back: Secondary | ICD-10-CM | POA: Diagnosis not present

## 2021-09-01 DIAGNOSIS — M955 Acquired deformity of pelvis: Secondary | ICD-10-CM | POA: Diagnosis not present

## 2021-09-01 DIAGNOSIS — M9903 Segmental and somatic dysfunction of lumbar region: Secondary | ICD-10-CM | POA: Diagnosis not present

## 2021-09-01 DIAGNOSIS — M9905 Segmental and somatic dysfunction of pelvic region: Secondary | ICD-10-CM | POA: Diagnosis not present

## 2021-09-01 DIAGNOSIS — M62830 Muscle spasm of back: Secondary | ICD-10-CM | POA: Diagnosis not present

## 2021-09-09 ENCOUNTER — Other Ambulatory Visit (INDEPENDENT_AMBULATORY_CARE_PROVIDER_SITE_OTHER): Payer: Self-pay | Admitting: Vascular Surgery

## 2021-09-09 DIAGNOSIS — I739 Peripheral vascular disease, unspecified: Secondary | ICD-10-CM

## 2021-09-10 ENCOUNTER — Ambulatory Visit (INDEPENDENT_AMBULATORY_CARE_PROVIDER_SITE_OTHER): Payer: Medicare HMO

## 2021-09-10 ENCOUNTER — Ambulatory Visit (INDEPENDENT_AMBULATORY_CARE_PROVIDER_SITE_OTHER): Payer: Medicare HMO | Admitting: Vascular Surgery

## 2021-09-10 ENCOUNTER — Encounter (INDEPENDENT_AMBULATORY_CARE_PROVIDER_SITE_OTHER): Payer: Self-pay | Admitting: Vascular Surgery

## 2021-09-10 VITALS — BP 111/80 | HR 84 | Resp 16 | Wt 168.8 lb

## 2021-09-10 DIAGNOSIS — I10 Essential (primary) hypertension: Secondary | ICD-10-CM

## 2021-09-10 DIAGNOSIS — I739 Peripheral vascular disease, unspecified: Secondary | ICD-10-CM | POA: Diagnosis not present

## 2021-09-10 DIAGNOSIS — E782 Mixed hyperlipidemia: Secondary | ICD-10-CM | POA: Diagnosis not present

## 2021-09-10 NOTE — Assessment & Plan Note (Signed)
lipid control important in reducing the progression of atherosclerotic disease. Continue statin therapy  

## 2021-09-10 NOTE — Assessment & Plan Note (Signed)
he has noncompressible vessels from medial calcification with triphasic waveforms and normal digital pressures.  It does not appear as if he has arterial insufficiency as a cause of his lower extremity pain, but with his medial calcification I would plan on checking this every year or so with noninvasive studies.  No role for intervention at this time.  Continue aspirin and statin agent. ?

## 2021-09-10 NOTE — Progress Notes (Signed)
? ? ?MRN : [id number] ? ?Patrick Wilson is a 88 y.o. (06/26/1933) male who presents with chief complaint of  ?Chief Complaint  ?Patient presents with  ? Follow-up  ?  Ref Sabra Heck abi consult,claudication/PAD  ?. ? ?History of Present Illness: Patient returns today in follow up of his peripheral arterial disease and leg pain and cramping.  He is bothered intermittently with cramping pain in both calves particular with activity.  This is generally relieved with rest.  He does still walk up to a mile a day.  No open wounds.  No rest pain.  He was checked last year and found to have noncompressible vessels from medial calcification but triphasic waveforms and normal digital pressures.  Again on noninvasive studies today, he has noncompressible vessels from medial calcification with triphasic waveforms and normal digital pressures. ? ?Current Outpatient Medications  ?Medication Sig Dispense Refill  ? aspirin EC 81 MG tablet Take 81 mg by mouth daily.    ? Denture Care Products (DENTURE ADHESIVE) CREA Apply 1 application topically daily as needed.     ? hydrochlorothiazide (HYDRODIURIL) 25 MG tablet Take by mouth.    ? meloxicam (MOBIC) 15 MG tablet Take 15 mg by mouth at bedtime.    ? nitroGLYCERIN (NITROSTAT) 0.4 MG SL tablet Place 0.4 mg under the tongue every 5 (five) minutes as needed for chest pain. Reported on 11/13/2015    ? traZODone (DESYREL) 100 MG tablet Take 100 mg by mouth at bedtime.    ? venlafaxine XR (EFFEXOR-XR) 37.5 MG 24 hr capsule Take 37.5 mg by mouth daily.  3  ? ALPRAZolam (XANAX) 0.25 MG tablet Take 0.25 mg by mouth 2 (two) times a day. (Patient not taking: Reported on 02/05/2021)    ? atorvastatin (LIPITOR) 80 MG tablet Take 1 tablet (80 mg total) by mouth at bedtime. 30 tablet 1  ? atorvastatin (LIPITOR) 80 MG tablet Take by mouth. (Patient not taking: Reported on 02/05/2021)    ? B Complex Vitamins (VITAMIN B COMPLEX PO) Take 1 tablet by mouth daily.  (Patient not taking: Reported on 02/05/2021)    ?  diphenhydrAMINE (BENADRYL) 25 mg capsule Take 25 mg by mouth every 6 (six) hours as needed for allergies. (Patient not taking: Reported on 02/05/2021)    ? docusate sodium (COLACE) 100 MG capsule Take 1 capsule (100 mg total) by mouth 2 (two) times daily. (Patient not taking: Reported on 04/01/2019) 60 capsule 0  ? donepezil (ARICEPT) 10 MG tablet Take by mouth.    ? lisinopril (ZESTRIL) 2.5 MG tablet Take 1 tablet (2.5 mg total) by mouth daily. 30 tablet 1  ? magnesium gluconate (MAGONATE) 500 MG tablet Take 500 mg by mouth daily. (Patient not taking: Reported on 02/05/2021)    ? metoCLOPramide (REGLAN) 5 MG tablet Take 5 mg by mouth 2 (two) times daily. (Patient not taking: Reported on 02/05/2021)    ? metoprolol tartrate (LOPRESSOR) 25 MG tablet Take 0.5 tablets (12.5 mg total) by mouth every 12 (twelve) hours. 30 tablet 1  ? Multiple Vitamins-Minerals (BL CENTURY SENIOR PO) Take 1 tablet by mouth daily. (Patient not taking: Reported on 02/05/2021)    ? olmesartan (BENICAR) 20 MG tablet Take by mouth.    ? Omega-3 Fatty Acids (FISH OIL) 1000 MG CAPS Take 1,000 mg by mouth daily. (Patient not taking: Reported on 02/05/2021)    ? omeprazole (PRILOSEC) 40 MG capsule Take 40 mg by mouth 2 (two) times daily. (Patient not taking: Reported on 02/05/2021)    ?  sucralfate (CARAFATE) 1 g tablet Take 1 g by mouth 4 (four) times daily. (Patient not taking: Reported on 02/05/2021)    ? tamsulosin (FLOMAX) 0.4 MG CAPS capsule Take 1 capsule (0.4 mg total) by mouth daily. (Patient not taking: Reported on 02/05/2021) 30 capsule 0  ? ticagrelor (BRILINTA) 90 MG TABS tablet Take by mouth. (Patient not taking: Reported on 02/05/2021)    ? ?No current facility-administered medications for this visit.  ? ? ?Past Medical History:  ?Diagnosis Date  ? Cancer [facility])   ? SKIN  ? Coronary artery disease   ? Depression   ? GERD (gastroesophageal reflux disease)   ? History of kidney stones   ? Hypertension   ? Myocardial infarction [facility])   ? 2012   ? ? ?Past Surgical History:  ?Procedure Laterality Date  ? cardiac stents    ? CATARACT EXTRACTION W/PHACO Left 10/24/2017  ? Procedure: CATARACT EXTRACTION PHACO AND INTRAOCULAR LENS PLACEMENT (IOC);  Surgeon: Birder Robson, MD;  Location: [facility] ORS;  Service: Ophthalmology;  Laterality: Left;  [place] 00:34.3 ?AP% 15.8 ?CDE 5.41 ?Fluid Pack lot # [id number]  ? CATARACT EXTRACTION W/PHACO Right 11/28/2017  ? Procedure: CATARACT EXTRACTION PHACO AND INTRAOCULAR LENS PLACEMENT (IOC);  Surgeon: Birder Robson, MD;  Location: [facility] ORS;  Service: Ophthalmology;  Laterality: Right;  [place] 00:32 ?AP% 11.3 ?CDE 3.66 ?Fluid pack lot # [id number]  ? CORONARY ANGIOPLASTY    ? STENT  ? CORONARY/GRAFT ACUTE MI REVASCULARIZATION N/A 10/17/2018  ? Procedure: Coronary/Graft Acute MI Revascularization;  Surgeon: Yolonda Kida, MD;  Location: [place] [facility];  Service: Cardiovascular;  Laterality: N/A;  ? EXTRACORPOREAL SHOCK WAVE LITHOTRIPSY Left 12/03/2015  ? Procedure: EXTRACORPOREAL SHOCK WAVE LITHOTRIPSY (ESWL);  Surgeon: Hollice Espy, MD;  Location: [facility] ORS;  Service: Urology;  Laterality: Left;  ? LEFT HEART CATH AND CORONARY ANGIOGRAPHY N/A 10/17/2018  ? Procedure: LEFT HEART CATH AND CORONARY ANGIOGRAPHY;  Surgeon: Yolonda Kida, MD;  Location: [place] [facility];  Service: Cardiovascular;  Laterality: N/A;  ? ? ? ?Social History  ? ?Tobacco Use  ? Smoking status: Never  ? Smokeless tobacco: Current  ?  Types: Chew  ? Tobacco comments:  ?  May be ready to quit.  ?Vaping Use  ? Vaping Use: Never used  ?Substance Use Topics  ? Alcohol use: Yes  ?  Comment: OCCAS  ? Drug use: Not Currently  ? ?  ? ? ?No family history on file. ? ? ?Allergies  ?Allergen Reactions  ? Penicillins Rash and Other (See Comments)  ?  Has patient had a PCN reaction causing immediate rash, facial/tongue/throat swelling, SOB or lightheadedness with hypotension: No ?Has patient had a PCN reaction causing severe rash involving mucus membranes or  skin necrosis: No ?Has patient had a PCN reaction that required hospitalization No ?Has patient had a PCN reaction occurring within the last 10 years: No ?If all of the above answers are "NO", then may proceed with Cephalosporin use.  ? ? ? ?REVIEW OF SYSTEMS (Negative unless checked) ? ?Constitutional: '[]'$ Weight loss  '[]'$ Fever  '[]'$ Chills ?Cardiac: '[]'$ Chest pain   '[]'$ Chest pressure   '[]'$ Palpitations   '[]'$ Shortness of breath when laying flat   '[]'$ Shortness of breath at rest   '[]'$ Shortness of breath with exertion. ?Vascular:  '[x]'$ Pain in legs with walking   '[]'$ Pain in legs at rest   '[]'$ Pain in legs when laying flat   '[x]'$ Claudication   '[]'$ Pain in feet when walking  '[]'$ Pain in feet at rest  '[]'$   Pain in feet when laying flat   '[]'$ History of DVT   '[]'$ Phlebitis   '[]'$ Swelling in legs   '[]'$ Varicose veins   '[]'$ Non-healing ulcers ?Pulmonary:   '[]'$ Uses home oxygen   '[]'$ Productive cough   '[]'$ Hemoptysis   '[]'$ Wheeze  '[]'$ COPD   '[]'$ Asthma ?Neurologic:  '[]'$ Dizziness  '[]'$ Blackouts   '[]'$ Seizures   '[]'$ History of stroke   '[]'$ History of TIA  '[]'$ Aphasia   '[]'$ Temporary blindness   '[]'$ Dysphagia   '[]'$ Weakness or numbness in arms   '[]'$ Weakness or numbness in legs ?Musculoskeletal:  '[x]'$ Arthritis   '[]'$ Joint swelling   '[]'$ Joint pain   '[]'$ Low back pain ?Hematologic:  '[]'$ Easy bruising  '[]'$ Easy bleeding   '[]'$ Hypercoagulable state   '[]'$ Anemic   ?Gastrointestinal:  '[]'$ Blood in stool   '[]'$ Vomiting blood  '[]'$ Gastroesophageal reflux/heartburn   '[]'$ Abdominal pain ?Genitourinary:  '[]'$ Chronic kidney disease   '[]'$ Difficult urination  '[]'$ Frequent urination  '[]'$ Burning with urination   '[]'$ Hematuria ?Skin:  '[]'$ Rashes   '[]'$ Ulcers   '[]'$ Wounds ?Psychological:  '[]'$ History of anxiety   '[]'$  History of major depression. ? ?Physical Examination ? ?BP 111/80 (BP Location: Right Arm)   Pulse 84   Resp 16   Wt 168 lb 12.8 oz (76.6 kg)   BMI 25.67 kg/m?  ?Gen:  WD/WN, NAD.  Appears younger than stated age ?Head: [place]/AT, No temporalis wasting. ?Ear/Nose/Throat: Hearing grossly intact, nares w/o erythema or drainage ?Eyes:  Conjunctiva clear. Sclera non-icteric ?Neck: Supple.  Trachea midline ?Pulmonary:  Good air movement, no use of accessory muscles.  ?Cardiac: RRR, no JVD ?Vascular:  ?Vessel Right Left  ?Radial Palpable Palpable

## 2021-09-10 NOTE — Assessment & Plan Note (Signed)
blood pressure control important in reducing the progression of atherosclerotic disease. On appropriate oral medications.  

## 2021-09-20 ENCOUNTER — Ambulatory Visit: Payer: Medicare HMO | Admitting: Podiatry

## 2021-09-22 DIAGNOSIS — M9903 Segmental and somatic dysfunction of lumbar region: Secondary | ICD-10-CM | POA: Diagnosis not present

## 2021-09-22 DIAGNOSIS — M955 Acquired deformity of pelvis: Secondary | ICD-10-CM | POA: Diagnosis not present

## 2021-09-22 DIAGNOSIS — M62830 Muscle spasm of back: Secondary | ICD-10-CM | POA: Diagnosis not present

## 2021-09-22 DIAGNOSIS — M9905 Segmental and somatic dysfunction of pelvic region: Secondary | ICD-10-CM | POA: Diagnosis not present

## 2021-10-20 DIAGNOSIS — M955 Acquired deformity of pelvis: Secondary | ICD-10-CM | POA: Diagnosis not present

## 2021-10-20 DIAGNOSIS — M62830 Muscle spasm of back: Secondary | ICD-10-CM | POA: Diagnosis not present

## 2021-10-20 DIAGNOSIS — M9905 Segmental and somatic dysfunction of pelvic region: Secondary | ICD-10-CM | POA: Diagnosis not present

## 2021-10-20 DIAGNOSIS — M9903 Segmental and somatic dysfunction of lumbar region: Secondary | ICD-10-CM | POA: Diagnosis not present

## 2021-11-17 DIAGNOSIS — M62830 Muscle spasm of back: Secondary | ICD-10-CM | POA: Diagnosis not present

## 2021-11-17 DIAGNOSIS — M9905 Segmental and somatic dysfunction of pelvic region: Secondary | ICD-10-CM | POA: Diagnosis not present

## 2021-11-17 DIAGNOSIS — M9903 Segmental and somatic dysfunction of lumbar region: Secondary | ICD-10-CM | POA: Diagnosis not present

## 2021-11-17 DIAGNOSIS — M955 Acquired deformity of pelvis: Secondary | ICD-10-CM | POA: Diagnosis not present

## 2021-11-24 DIAGNOSIS — E782 Mixed hyperlipidemia: Secondary | ICD-10-CM | POA: Diagnosis not present

## 2021-11-24 DIAGNOSIS — I2510 Atherosclerotic heart disease of native coronary artery without angina pectoris: Secondary | ICD-10-CM | POA: Diagnosis not present

## 2021-11-24 DIAGNOSIS — I2119 ST elevation (STEMI) myocardial infarction involving other coronary artery of inferior wall: Secondary | ICD-10-CM | POA: Diagnosis not present

## 2021-12-08 DIAGNOSIS — M9903 Segmental and somatic dysfunction of lumbar region: Secondary | ICD-10-CM | POA: Diagnosis not present

## 2021-12-08 DIAGNOSIS — M62830 Muscle spasm of back: Secondary | ICD-10-CM | POA: Diagnosis not present

## 2021-12-08 DIAGNOSIS — M9905 Segmental and somatic dysfunction of pelvic region: Secondary | ICD-10-CM | POA: Diagnosis not present

## 2021-12-08 DIAGNOSIS — M955 Acquired deformity of pelvis: Secondary | ICD-10-CM | POA: Diagnosis not present

## 2021-12-29 DIAGNOSIS — M62830 Muscle spasm of back: Secondary | ICD-10-CM | POA: Diagnosis not present

## 2021-12-29 DIAGNOSIS — M9905 Segmental and somatic dysfunction of pelvic region: Secondary | ICD-10-CM | POA: Diagnosis not present

## 2021-12-29 DIAGNOSIS — M955 Acquired deformity of pelvis: Secondary | ICD-10-CM | POA: Diagnosis not present

## 2021-12-29 DIAGNOSIS — M9903 Segmental and somatic dysfunction of lumbar region: Secondary | ICD-10-CM | POA: Diagnosis not present

## 2022-01-14 DIAGNOSIS — E782 Mixed hyperlipidemia: Secondary | ICD-10-CM | POA: Diagnosis not present

## 2022-01-21 DIAGNOSIS — Z23 Encounter for immunization: Secondary | ICD-10-CM | POA: Diagnosis not present

## 2022-01-21 DIAGNOSIS — N183 Chronic kidney disease, stage 3 unspecified: Secondary | ICD-10-CM | POA: Diagnosis not present

## 2022-01-21 DIAGNOSIS — F03A0 Unspecified dementia, mild, without behavioral disturbance, psychotic disturbance, mood disturbance, and anxiety: Secondary | ICD-10-CM | POA: Diagnosis not present

## 2022-02-02 DIAGNOSIS — M62830 Muscle spasm of back: Secondary | ICD-10-CM | POA: Diagnosis not present

## 2022-02-02 DIAGNOSIS — M9905 Segmental and somatic dysfunction of pelvic region: Secondary | ICD-10-CM | POA: Diagnosis not present

## 2022-02-02 DIAGNOSIS — M9903 Segmental and somatic dysfunction of lumbar region: Secondary | ICD-10-CM | POA: Diagnosis not present

## 2022-02-02 DIAGNOSIS — M955 Acquired deformity of pelvis: Secondary | ICD-10-CM | POA: Diagnosis not present

## 2022-02-07 DIAGNOSIS — M62830 Muscle spasm of back: Secondary | ICD-10-CM | POA: Diagnosis not present

## 2022-02-07 DIAGNOSIS — M9905 Segmental and somatic dysfunction of pelvic region: Secondary | ICD-10-CM | POA: Diagnosis not present

## 2022-02-07 DIAGNOSIS — M9903 Segmental and somatic dysfunction of lumbar region: Secondary | ICD-10-CM | POA: Diagnosis not present

## 2022-02-07 DIAGNOSIS — M955 Acquired deformity of pelvis: Secondary | ICD-10-CM | POA: Diagnosis not present

## 2022-02-09 DIAGNOSIS — M9903 Segmental and somatic dysfunction of lumbar region: Secondary | ICD-10-CM | POA: Diagnosis not present

## 2022-02-09 DIAGNOSIS — M955 Acquired deformity of pelvis: Secondary | ICD-10-CM | POA: Diagnosis not present

## 2022-02-09 DIAGNOSIS — M62830 Muscle spasm of back: Secondary | ICD-10-CM | POA: Diagnosis not present

## 2022-02-09 DIAGNOSIS — M9905 Segmental and somatic dysfunction of pelvic region: Secondary | ICD-10-CM | POA: Diagnosis not present

## 2022-02-16 DIAGNOSIS — M9903 Segmental and somatic dysfunction of lumbar region: Secondary | ICD-10-CM | POA: Diagnosis not present

## 2022-02-16 DIAGNOSIS — M955 Acquired deformity of pelvis: Secondary | ICD-10-CM | POA: Diagnosis not present

## 2022-02-16 DIAGNOSIS — M62830 Muscle spasm of back: Secondary | ICD-10-CM | POA: Diagnosis not present

## 2022-02-16 DIAGNOSIS — M9905 Segmental and somatic dysfunction of pelvic region: Secondary | ICD-10-CM | POA: Diagnosis not present

## 2022-02-21 DIAGNOSIS — M9903 Segmental and somatic dysfunction of lumbar region: Secondary | ICD-10-CM | POA: Diagnosis not present

## 2022-02-21 DIAGNOSIS — M955 Acquired deformity of pelvis: Secondary | ICD-10-CM | POA: Diagnosis not present

## 2022-02-21 DIAGNOSIS — M62830 Muscle spasm of back: Secondary | ICD-10-CM | POA: Diagnosis not present

## 2022-02-21 DIAGNOSIS — M9905 Segmental and somatic dysfunction of pelvic region: Secondary | ICD-10-CM | POA: Diagnosis not present

## 2022-02-28 ENCOUNTER — Encounter (INDEPENDENT_AMBULATORY_CARE_PROVIDER_SITE_OTHER): Payer: Self-pay

## 2022-03-09 DIAGNOSIS — M9903 Segmental and somatic dysfunction of lumbar region: Secondary | ICD-10-CM | POA: Diagnosis not present

## 2022-03-09 DIAGNOSIS — M62830 Muscle spasm of back: Secondary | ICD-10-CM | POA: Diagnosis not present

## 2022-03-09 DIAGNOSIS — M9905 Segmental and somatic dysfunction of pelvic region: Secondary | ICD-10-CM | POA: Diagnosis not present

## 2022-03-09 DIAGNOSIS — M955 Acquired deformity of pelvis: Secondary | ICD-10-CM | POA: Diagnosis not present

## 2022-03-30 DIAGNOSIS — M9903 Segmental and somatic dysfunction of lumbar region: Secondary | ICD-10-CM | POA: Diagnosis not present

## 2022-03-30 DIAGNOSIS — M9905 Segmental and somatic dysfunction of pelvic region: Secondary | ICD-10-CM | POA: Diagnosis not present

## 2022-03-30 DIAGNOSIS — M955 Acquired deformity of pelvis: Secondary | ICD-10-CM | POA: Diagnosis not present

## 2022-03-30 DIAGNOSIS — M62830 Muscle spasm of back: Secondary | ICD-10-CM | POA: Diagnosis not present

## 2022-04-20 DIAGNOSIS — M9905 Segmental and somatic dysfunction of pelvic region: Secondary | ICD-10-CM | POA: Diagnosis not present

## 2022-04-20 DIAGNOSIS — M9903 Segmental and somatic dysfunction of lumbar region: Secondary | ICD-10-CM | POA: Diagnosis not present

## 2022-04-20 DIAGNOSIS — M62830 Muscle spasm of back: Secondary | ICD-10-CM | POA: Diagnosis not present

## 2022-04-20 DIAGNOSIS — M955 Acquired deformity of pelvis: Secondary | ICD-10-CM | POA: Diagnosis not present

## 2022-05-04 DIAGNOSIS — M9903 Segmental and somatic dysfunction of lumbar region: Secondary | ICD-10-CM | POA: Diagnosis not present

## 2022-05-04 DIAGNOSIS — M9905 Segmental and somatic dysfunction of pelvic region: Secondary | ICD-10-CM | POA: Diagnosis not present

## 2022-05-04 DIAGNOSIS — M955 Acquired deformity of pelvis: Secondary | ICD-10-CM | POA: Diagnosis not present

## 2022-05-04 DIAGNOSIS — M62830 Muscle spasm of back: Secondary | ICD-10-CM | POA: Diagnosis not present

## 2022-05-19 DIAGNOSIS — M9905 Segmental and somatic dysfunction of pelvic region: Secondary | ICD-10-CM | POA: Diagnosis not present

## 2022-05-19 DIAGNOSIS — M9903 Segmental and somatic dysfunction of lumbar region: Secondary | ICD-10-CM | POA: Diagnosis not present

## 2022-05-19 DIAGNOSIS — M955 Acquired deformity of pelvis: Secondary | ICD-10-CM | POA: Diagnosis not present

## 2022-05-19 DIAGNOSIS — M62830 Muscle spasm of back: Secondary | ICD-10-CM | POA: Diagnosis not present

## 2022-05-26 DIAGNOSIS — I10 Essential (primary) hypertension: Secondary | ICD-10-CM | POA: Diagnosis not present

## 2022-05-26 DIAGNOSIS — I700 Atherosclerosis of aorta: Secondary | ICD-10-CM | POA: Diagnosis not present

## 2022-05-26 DIAGNOSIS — I2119 ST elevation (STEMI) myocardial infarction involving other coronary artery of inferior wall: Secondary | ICD-10-CM | POA: Diagnosis not present

## 2022-05-26 DIAGNOSIS — E782 Mixed hyperlipidemia: Secondary | ICD-10-CM | POA: Diagnosis not present

## 2022-05-26 DIAGNOSIS — I2510 Atherosclerotic heart disease of native coronary artery without angina pectoris: Secondary | ICD-10-CM | POA: Diagnosis not present

## 2022-06-01 DIAGNOSIS — B351 Tinea unguium: Secondary | ICD-10-CM | POA: Diagnosis not present

## 2022-06-01 DIAGNOSIS — D485 Neoplasm of uncertain behavior of skin: Secondary | ICD-10-CM | POA: Diagnosis not present

## 2022-06-01 DIAGNOSIS — C44622 Squamous cell carcinoma of skin of right upper limb, including shoulder: Secondary | ICD-10-CM | POA: Diagnosis not present

## 2022-06-01 DIAGNOSIS — X32XXXA Exposure to sunlight, initial encounter: Secondary | ICD-10-CM | POA: Diagnosis not present

## 2022-06-01 DIAGNOSIS — L820 Inflamed seborrheic keratosis: Secondary | ICD-10-CM | POA: Diagnosis not present

## 2022-06-01 DIAGNOSIS — L570 Actinic keratosis: Secondary | ICD-10-CM | POA: Diagnosis not present

## 2022-06-01 DIAGNOSIS — D225 Melanocytic nevi of trunk: Secondary | ICD-10-CM | POA: Diagnosis not present

## 2022-06-01 DIAGNOSIS — D2262 Melanocytic nevi of left upper limb, including shoulder: Secondary | ICD-10-CM | POA: Diagnosis not present

## 2022-06-01 DIAGNOSIS — B353 Tinea pedis: Secondary | ICD-10-CM | POA: Diagnosis not present

## 2022-06-01 DIAGNOSIS — R208 Other disturbances of skin sensation: Secondary | ICD-10-CM | POA: Diagnosis not present

## 2022-06-08 ENCOUNTER — Emergency Department
Admission: EM | Admit: 2022-06-08 | Discharge: 2022-06-08 | Disposition: A | Discharge status: Home / Self Care | Payer: Medicare HMO | Attending: Emergency Medicine | Admitting: Emergency Medicine

## 2022-06-08 ENCOUNTER — Emergency Department: Payer: Medicare HMO

## 2022-06-08 DIAGNOSIS — D72829 Elevated white blood cell count, unspecified: Secondary | ICD-10-CM | POA: Insufficient documentation

## 2022-06-08 DIAGNOSIS — R109 Unspecified abdominal pain: Secondary | ICD-10-CM | POA: Diagnosis present

## 2022-06-08 DIAGNOSIS — N3289 Other specified disorders of bladder: Secondary | ICD-10-CM | POA: Diagnosis not present

## 2022-06-08 DIAGNOSIS — N200 Calculus of kidney: Secondary | ICD-10-CM | POA: Insufficient documentation

## 2022-06-08 DIAGNOSIS — I2510 Atherosclerotic heart disease of native coronary artery without angina pectoris: Secondary | ICD-10-CM | POA: Diagnosis not present

## 2022-06-08 DIAGNOSIS — N210 Calculus in bladder: Secondary | ICD-10-CM | POA: Diagnosis not present

## 2022-06-08 DIAGNOSIS — I10 Essential (primary) hypertension: Secondary | ICD-10-CM | POA: Diagnosis not present

## 2022-06-08 DIAGNOSIS — Q438 Other specified congenital malformations of intestine: Secondary | ICD-10-CM | POA: Diagnosis not present

## 2022-06-08 LAB — CBC
HCT: 44.4 % (ref 39.0–52.0)
Hemoglobin: 15.1 g/dL (ref 13.0–17.0)
MCH: 31.4 pg (ref 26.0–34.0)
MCHC: 34 g/dL (ref 30.0–36.0)
MCV: 92.3 fL (ref 80.0–100.0)
Platelets: 202 10*3/uL (ref 150–400)
RBC: 4.81 MIL/uL (ref 4.22–5.81)
RDW: 12.5 % (ref 11.5–15.5)
WBC: 17.7 10*3/uL — ABNORMAL HIGH (ref 4.0–10.5)
nRBC: 0 % (ref 0.0–0.2)

## 2022-06-08 LAB — URINALYSIS, ROUTINE W REFLEX MICROSCOPIC
Bacteria, UA: NONE SEEN
Bilirubin Urine: NEGATIVE
Glucose, UA: NEGATIVE mg/dL
Ketones, ur: NEGATIVE mg/dL
Nitrite: NEGATIVE
Protein, ur: 100 mg/dL — AB
Specific Gravity, Urine: 1.021 (ref 1.005–1.030)
pH: 5 (ref 5.0–8.0)

## 2022-06-08 LAB — COMPREHENSIVE METABOLIC PANEL
ALT: 34 U/L (ref 0–44)
AST: 47 U/L — ABNORMAL HIGH (ref 15–41)
Albumin: 4.1 g/dL (ref 3.5–5.0)
Alkaline Phosphatase: 68 U/L (ref 38–126)
Anion gap: 11 (ref 5–15)
BUN: 30 mg/dL — ABNORMAL HIGH (ref 8–23)
CO2: 24 mmol/L (ref 22–32)
Calcium: 9.2 mg/dL (ref 8.9–10.3)
Chloride: 102 mmol/L (ref 98–111)
Creatinine, Ser: 2.1 mg/dL — ABNORMAL HIGH (ref 0.61–1.24)
GFR, Estimated: 30 mL/min — ABNORMAL LOW (ref >60)
Glucose, Bld: 128 mg/dL — ABNORMAL HIGH (ref 70–99)
Potassium: 4 mmol/L (ref 3.5–5.1)
Sodium: 137 mmol/L (ref 135–145)
Total Bilirubin: 1.1 mg/dL (ref 0.3–1.2)
Total Protein: 7 g/dL (ref 6.5–8.1)

## 2022-06-08 MED ORDER — OXYCODONE HCL 5 MG PO TABS: 5.0000 mg | ORAL_TABLET | Freq: Four times a day (QID) | ORAL | 0 refills | Status: AC | PRN

## 2022-06-08 MED ORDER — TAMSULOSIN HCL 0.4 MG PO CAPS: 0.4000 mg | ORAL_CAPSULE | Freq: Every day | ORAL | 0 refills | Status: AC

## 2022-06-08 MED ORDER — KETOROLAC TROMETHAMINE 15 MG/ML IJ SOLN
15.0000 mg | Freq: Once | INTRAMUSCULAR | Status: AC
  Administered 2022-06-08: 15 mg via INTRAVENOUS
  Filled 2022-06-08: qty 1

## 2022-06-08 NOTE — ED Triage Notes (Signed)
Pt states that he initially went to the kidney dr for right flank pain, but because he hasn't been seen there since 2018 he's considered a new pt and was sent to the ER  Pt states that the pain started yesterday, denies difficulty urinating

## 2022-06-08 NOTE — ED Notes (Signed)
Patient transported to CT 

## 2022-06-08 NOTE — ED Notes (Signed)
Pt returned from CT °

## 2022-06-08 NOTE — Discharge Instructions (Signed)
Take tylenol 650 mg every 6 hours for pain. Avoid nonsteroidal anti-inflammatories like Motrin, ibuprofen, Advil, meloxicam due to your worsening kidney function.  Take Flomax as directed.  If your pain becomes severe despite taking those pain medications, you may take oxycodone as prescribed.   Thank you for choosing [place] for your health care today!  Please see your primary doctor this week for a follow up appointment.   Sometimes, in the early stages of certain disease courses it is difficult to detect in the emergency department evaluation -- so, it is important that you continue to monitor your symptoms and call your doctor right away or return to the emergency department if you develop any new or worsening symptoms.  Please go to the following website to schedule new (and existing) patient appointments:   [email or web address]  If you do not have a primary doctor try calling the following clinics to establish care:  If you have insurance:  [facility] [phone] [place] [address]   [facility]  [phone] [address] [place] [place] [address]   If you do not have insurance:  [facility]  [phone] [address] [place] [address]   The following is another list of primary care offices in the area who are accepting new patients at this time.  Please reach out to one of them directly and let them know you would like to schedule an appointment to follow up on an Emergency Department visit, and/or to establish a new primary care provider (PCP).  There are likely other primary care clinics in the are who are accepting new patients, but this is an excellent place to start:  [place] physician: Dr Lavon Paganini [address] [place], [place] [address] [phone]  [facility] Lead Physician: Dr Steele Sizer [address], [place], [place] [address] [phone]  [place] Physician: Dr Park Liter [address] [place], [place] [address] [phone]  [facility] Lead Physician: Dr Dewaine Oats [address] [place], [place], [place] [address] [phone]  [place] [facility] [place] Physician: Dr Halina Maidens [address] [place], [place] [address] [phone]   It was my pleasure to care for you today.   Hoover Brunette Jacelyn Grip, MD

## 2022-06-08 NOTE — ED Provider Notes (Signed)
[facility] Provider Note    Event Date/Time   First MD Initiated Contact with Patient 06/08/22 1247     (approximate)   History   Flank Pain   HPI  Patrick Wilson is a 89 y.o. male   Past medical history of current kidney stones, CAD, GERD, hypertension who presents to the emergency department with right flank/right lower quadrant pain consistent with kidney stones experienced in the past.  Colicky intermittent pain started 3 days ago which was mild but increasingly worsened over the last several days.  No dysuria or frequency.  No fevers or chills.  No testicular pain.  Denies chest pain.  No other acute medical complaints.  He went to his urologist at first but urologist sent him to the emergency department given he had not seen the urologist for many years and needs to reestablish as a new patient.  Independent Historian contributed to assessment above: Daughter at bedside  External Medical Documents Reviewed: emergency department visit dated December 2022 with chest pain      Physical Exam   Triage Vital Signs: ED Triage Vitals  Enc Vitals Group     BP 06/08/22 1220 (!) 160/82     Pulse Rate 06/08/22 1220 60     Resp 06/08/22 1220 16     Temp 06/08/22 1220 98 F (36.7 C)     Temp Source 06/08/22 1220 Oral     SpO2 06/08/22 1220 96 %     Weight 06/08/22 1221 168 lb (76.2 kg)     Height 06/08/22 1221 '5\' 8"'$  (1.727 m)     Head Circumference --      Peak Flow --      Pain Score 06/08/22 1221 8     Pain Loc --      Pain Edu? --      Excl. in [place]? --     Most recent vital signs: Vitals:   06/08/22 1220  BP: (!) 160/82  Pulse: 60  Resp: 16  Temp: 98 F (36.7 C)  SpO2: 96%    General: Awake, no distress.  CV:  Good peripheral perfusion.  Resp:  Normal effort.  Abd:  No distention.  Other:  Wake alert comfortable appearing with hemodynamic significant for hypertension 160/80 otherwise within normal limits.  Abdomen is soft nontender to  palpation in all quadrants.  No CVA tenderness.   ED Results / Procedures / Treatments   Labs (all labs ordered are listed, but only abnormal results are displayed) Labs Reviewed  CBC - Abnormal; Notable for the following components:      Result Value   WBC 17.7 (*)    All other components within normal limits  COMPREHENSIVE METABOLIC PANEL - Abnormal; Notable for the following components:   Glucose, Bld 128 (*)    BUN 30 (*)    Creatinine, Ser 2.10 (*)    AST 47 (*)    GFR, Estimated 30 (*)    All other components within normal limits  URINALYSIS, ROUTINE W REFLEX MICROSCOPIC - Abnormal; Notable for the following components:   Color, Urine YELLOW (*)    APPearance CLEAR (*)    Hgb urine dipstick SMALL (*)    Protein, ur 100 (*)    Leukocytes,Ua TRACE (*)    All other components within normal limits     I ordered and reviewed the above labs they are notable for white blood cell count elevated at 17.7 and a creatinine of 2.1 which  appears improved from last creatinine measured in 2023 at 2.5 from [place] I independently reviewed and interpreted multiple small hyperdensities in R kidney    PROCEDURES:  Critical Care performed: No  Procedures   MEDICATIONS ORDERED IN ED: Medications  ketorolac (TORADOL) 15 MG/ML injection 15 mg (15 mg Intravenous Given 06/08/22 1312)      IMPRESSION / MDM / ASSESSMENT AND PLAN / ED COURSE  I reviewed the triage vital signs and the nursing notes.                                Patient's presentation is most consistent with acute presentation with potential threat to life or bodily function.  Differential diagnosis includes, but is not limited to, kidney stone, urine infection or pyelonephritis, intra-abdominal infection   The patient is on the cardiac monitor to evaluate for evidence of arrhythmia and/or significant heart rate changes.  MDM: This is a patient with recurrent kidney stones that has pain consistent with  kidney stone he is experienced in the past, colicky, right lower quadrant/flank.  His urinalysis shows some red blood cells consistent with kidney stone as well.  I considered but less likely intra-abdominal infection given benign abdominal exam ACS given no chest pain.  Kidney function appears to be at baseline compared to prior in 2023 he appears comfortable will offer some pain control in the emergency department as well as a CT flank to assess for size and location and confirm diagnosis.  I considered hospitalization for admission or observation since pain is well-controlled in the emergency department and small 3 mm stone identified in the UVJ, I think outpatient follow-up and management is most appropriate at this time, patient is in agreement.  I prescribed Flomax and advised him to take Tylenol and prescription of oxycodone for breakthrough/severe pain.  Avoid NSAIDs given poor renal function.  Follow-up with urology.        FINAL CLINICAL IMPRESSION(S) / ED DIAGNOSES   Final diagnoses:  Kidney stone     Rx / DC Orders   ED Discharge Orders          Ordered    tamsulosin (FLOMAX) 0.4 MG CAPS capsule  Daily        06/08/22 1417    oxyCODONE (ROXICODONE) 5 MG immediate release tablet  Every 6 hours PRN        06/08/22 1417             Note:  This document was prepared using Dragon voice recognition software and may include unintentional dictation errors.    Lucillie Garfinkel, MD 06/08/22 [phone]

## 2022-06-21 ENCOUNTER — Telehealth: Payer: Self-pay | Admitting: *Deleted

## 2022-06-21 NOTE — Telephone Encounter (Signed)
     Patient  visit on 06/08/2022  at [facility] was treatment   Have you been able to follow up with your primary care physician? Patient passed stone   The patient was able to obtain any needed medicine or equipment.  Are there diet recommendations that you are having difficulty following?  Patient expresses understanding of discharge instructions and education provided has no other needs at this time.   yes [name] [phone] [address] , [place] [address] Email : [email or web address]

## 2022-07-05 DIAGNOSIS — M955 Acquired deformity of pelvis: Secondary | ICD-10-CM | POA: Diagnosis not present

## 2022-07-05 DIAGNOSIS — M9905 Segmental and somatic dysfunction of pelvic region: Secondary | ICD-10-CM | POA: Diagnosis not present

## 2022-07-05 DIAGNOSIS — M62830 Muscle spasm of back: Secondary | ICD-10-CM | POA: Diagnosis not present

## 2022-07-05 DIAGNOSIS — M9903 Segmental and somatic dysfunction of lumbar region: Secondary | ICD-10-CM | POA: Diagnosis not present

## 2022-07-06 DIAGNOSIS — M9903 Segmental and somatic dysfunction of lumbar region: Secondary | ICD-10-CM | POA: Diagnosis not present

## 2022-07-06 DIAGNOSIS — M9905 Segmental and somatic dysfunction of pelvic region: Secondary | ICD-10-CM | POA: Diagnosis not present

## 2022-07-06 DIAGNOSIS — M62830 Muscle spasm of back: Secondary | ICD-10-CM | POA: Diagnosis not present

## 2022-07-06 DIAGNOSIS — M955 Acquired deformity of pelvis: Secondary | ICD-10-CM | POA: Diagnosis not present

## 2022-07-07 DIAGNOSIS — M9905 Segmental and somatic dysfunction of pelvic region: Secondary | ICD-10-CM | POA: Diagnosis not present

## 2022-07-07 DIAGNOSIS — M955 Acquired deformity of pelvis: Secondary | ICD-10-CM | POA: Diagnosis not present

## 2022-07-07 DIAGNOSIS — M9903 Segmental and somatic dysfunction of lumbar region: Secondary | ICD-10-CM | POA: Diagnosis not present

## 2022-07-07 DIAGNOSIS — M62830 Muscle spasm of back: Secondary | ICD-10-CM | POA: Diagnosis not present

## 2022-07-08 DIAGNOSIS — M9905 Segmental and somatic dysfunction of pelvic region: Secondary | ICD-10-CM | POA: Diagnosis not present

## 2022-07-08 DIAGNOSIS — M955 Acquired deformity of pelvis: Secondary | ICD-10-CM | POA: Diagnosis not present

## 2022-07-08 DIAGNOSIS — M62830 Muscle spasm of back: Secondary | ICD-10-CM | POA: Diagnosis not present

## 2022-07-08 DIAGNOSIS — M9903 Segmental and somatic dysfunction of lumbar region: Secondary | ICD-10-CM | POA: Diagnosis not present

## 2022-07-12 DIAGNOSIS — M955 Acquired deformity of pelvis: Secondary | ICD-10-CM | POA: Diagnosis not present

## 2022-07-12 DIAGNOSIS — M9903 Segmental and somatic dysfunction of lumbar region: Secondary | ICD-10-CM | POA: Diagnosis not present

## 2022-07-12 DIAGNOSIS — M62830 Muscle spasm of back: Secondary | ICD-10-CM | POA: Diagnosis not present

## 2022-07-12 DIAGNOSIS — M9905 Segmental and somatic dysfunction of pelvic region: Secondary | ICD-10-CM | POA: Diagnosis not present

## 2022-07-14 DIAGNOSIS — M9903 Segmental and somatic dysfunction of lumbar region: Secondary | ICD-10-CM | POA: Diagnosis not present

## 2022-07-14 DIAGNOSIS — M9905 Segmental and somatic dysfunction of pelvic region: Secondary | ICD-10-CM | POA: Diagnosis not present

## 2022-07-14 DIAGNOSIS — M62830 Muscle spasm of back: Secondary | ICD-10-CM | POA: Diagnosis not present

## 2022-07-14 DIAGNOSIS — M955 Acquired deformity of pelvis: Secondary | ICD-10-CM | POA: Diagnosis not present

## 2022-07-18 DIAGNOSIS — E782 Mixed hyperlipidemia: Secondary | ICD-10-CM | POA: Diagnosis not present

## 2022-07-18 DIAGNOSIS — R739 Hyperglycemia, unspecified: Secondary | ICD-10-CM | POA: Diagnosis not present

## 2022-07-19 DIAGNOSIS — M955 Acquired deformity of pelvis: Secondary | ICD-10-CM | POA: Diagnosis not present

## 2022-07-19 DIAGNOSIS — M9905 Segmental and somatic dysfunction of pelvic region: Secondary | ICD-10-CM | POA: Diagnosis not present

## 2022-07-19 DIAGNOSIS — M62830 Muscle spasm of back: Secondary | ICD-10-CM | POA: Diagnosis not present

## 2022-07-19 DIAGNOSIS — M9903 Segmental and somatic dysfunction of lumbar region: Secondary | ICD-10-CM | POA: Diagnosis not present

## 2022-07-20 DIAGNOSIS — C44622 Squamous cell carcinoma of skin of right upper limb, including shoulder: Secondary | ICD-10-CM | POA: Diagnosis not present

## 2022-07-20 DIAGNOSIS — L905 Scar conditions and fibrosis of skin: Secondary | ICD-10-CM | POA: Diagnosis not present

## 2022-07-21 DIAGNOSIS — M9905 Segmental and somatic dysfunction of pelvic region: Secondary | ICD-10-CM | POA: Diagnosis not present

## 2022-07-21 DIAGNOSIS — M9903 Segmental and somatic dysfunction of lumbar region: Secondary | ICD-10-CM | POA: Diagnosis not present

## 2022-07-21 DIAGNOSIS — M955 Acquired deformity of pelvis: Secondary | ICD-10-CM | POA: Diagnosis not present

## 2022-07-21 DIAGNOSIS — M62830 Muscle spasm of back: Secondary | ICD-10-CM | POA: Diagnosis not present

## 2022-07-27 DIAGNOSIS — M955 Acquired deformity of pelvis: Secondary | ICD-10-CM | POA: Diagnosis not present

## 2022-07-27 DIAGNOSIS — M9905 Segmental and somatic dysfunction of pelvic region: Secondary | ICD-10-CM | POA: Diagnosis not present

## 2022-07-27 DIAGNOSIS — M9903 Segmental and somatic dysfunction of lumbar region: Secondary | ICD-10-CM | POA: Diagnosis not present

## 2022-07-27 DIAGNOSIS — M62830 Muscle spasm of back: Secondary | ICD-10-CM | POA: Diagnosis not present

## 2022-07-28 DIAGNOSIS — M1611 Unilateral primary osteoarthritis, right hip: Secondary | ICD-10-CM | POA: Diagnosis not present

## 2022-07-28 DIAGNOSIS — N184 Chronic kidney disease, stage 4 (severe): Secondary | ICD-10-CM | POA: Insufficient documentation

## 2022-07-28 DIAGNOSIS — Z1331 Encounter for screening for depression: Secondary | ICD-10-CM | POA: Diagnosis not present

## 2022-07-28 DIAGNOSIS — Z0000 Encounter for general adult medical examination without abnormal findings: Secondary | ICD-10-CM | POA: Diagnosis not present

## 2022-07-28 DIAGNOSIS — M5136 Other intervertebral disc degeneration, lumbar region: Secondary | ICD-10-CM | POA: Diagnosis not present

## 2022-07-28 DIAGNOSIS — G8929 Other chronic pain: Secondary | ICD-10-CM | POA: Diagnosis not present

## 2022-07-28 DIAGNOSIS — F32A Depression, unspecified: Secondary | ICD-10-CM | POA: Diagnosis not present

## 2022-07-28 DIAGNOSIS — M5416 Radiculopathy, lumbar region: Secondary | ICD-10-CM | POA: Diagnosis not present

## 2022-07-28 DIAGNOSIS — M5116 Intervertebral disc disorders with radiculopathy, lumbar region: Secondary | ICD-10-CM | POA: Diagnosis not present

## 2022-07-28 DIAGNOSIS — M25551 Pain in right hip: Secondary | ICD-10-CM | POA: Diagnosis not present

## 2022-08-17 DIAGNOSIS — M62830 Muscle spasm of back: Secondary | ICD-10-CM | POA: Diagnosis not present

## 2022-08-17 DIAGNOSIS — M955 Acquired deformity of pelvis: Secondary | ICD-10-CM | POA: Diagnosis not present

## 2022-08-17 DIAGNOSIS — M9903 Segmental and somatic dysfunction of lumbar region: Secondary | ICD-10-CM | POA: Diagnosis not present

## 2022-08-17 DIAGNOSIS — M9905 Segmental and somatic dysfunction of pelvic region: Secondary | ICD-10-CM | POA: Diagnosis not present

## 2022-08-31 ENCOUNTER — Other Ambulatory Visit: Payer: Self-pay

## 2022-08-31 ENCOUNTER — Emergency Department: Payer: Medicare HMO

## 2022-08-31 ENCOUNTER — Encounter: Payer: Self-pay | Admitting: Emergency Medicine

## 2022-08-31 ENCOUNTER — Emergency Department
Admission: EM | Admit: 2022-08-31 | Discharge: 2022-08-31 | Disposition: A | Discharge status: Home / Self Care | Payer: Medicare HMO | Attending: Emergency Medicine | Admitting: Emergency Medicine

## 2022-08-31 DIAGNOSIS — R8289 Other abnormal findings on cytological and histological examination of urine: Secondary | ICD-10-CM | POA: Diagnosis not present

## 2022-08-31 DIAGNOSIS — R0602 Shortness of breath: Secondary | ICD-10-CM | POA: Diagnosis not present

## 2022-08-31 DIAGNOSIS — R001 Bradycardia, unspecified: Secondary | ICD-10-CM | POA: Diagnosis not present

## 2022-08-31 DIAGNOSIS — R42 Dizziness and giddiness: Secondary | ICD-10-CM | POA: Diagnosis not present

## 2022-08-31 DIAGNOSIS — S0990XA Unspecified injury of head, initial encounter: Secondary | ICD-10-CM | POA: Diagnosis not present

## 2022-08-31 DIAGNOSIS — J9811 Atelectasis: Secondary | ICD-10-CM | POA: Diagnosis not present

## 2022-08-31 DIAGNOSIS — I10 Essential (primary) hypertension: Secondary | ICD-10-CM | POA: Diagnosis not present

## 2022-08-31 DIAGNOSIS — G309 Alzheimer's disease, unspecified: Secondary | ICD-10-CM | POA: Diagnosis not present

## 2022-08-31 DIAGNOSIS — Z20822 Contact with and (suspected) exposure to covid-19: Secondary | ICD-10-CM | POA: Diagnosis not present

## 2022-08-31 DIAGNOSIS — R531 Weakness: Secondary | ICD-10-CM

## 2022-08-31 DIAGNOSIS — R1010 Upper abdominal pain, unspecified: Secondary | ICD-10-CM | POA: Diagnosis not present

## 2022-08-31 DIAGNOSIS — Z85828 Personal history of other malignant neoplasm of skin: Secondary | ICD-10-CM | POA: Diagnosis not present

## 2022-08-31 DIAGNOSIS — I2510 Atherosclerotic heart disease of native coronary artery without angina pectoris: Secondary | ICD-10-CM | POA: Diagnosis not present

## 2022-08-31 DIAGNOSIS — F0280 Dementia in other diseases classified elsewhere without behavioral disturbance: Secondary | ICD-10-CM | POA: Diagnosis not present

## 2022-08-31 DIAGNOSIS — R059 Cough, unspecified: Secondary | ICD-10-CM | POA: Insufficient documentation

## 2022-08-31 DIAGNOSIS — R4182 Altered mental status, unspecified: Secondary | ICD-10-CM | POA: Insufficient documentation

## 2022-08-31 LAB — BASIC METABOLIC PANEL
Anion gap: 10 (ref 5–15)
BUN: 32 mg/dL — ABNORMAL HIGH (ref 8–23)
CO2: 23 mmol/L (ref 22–32)
Calcium: 9.2 mg/dL (ref 8.9–10.3)
Chloride: 105 mmol/L (ref 98–111)
Creatinine, Ser: 1.54 mg/dL — ABNORMAL HIGH (ref 0.61–1.24)
GFR, Estimated: 44 mL/min — ABNORMAL LOW (ref >60)
Glucose, Bld: 96 mg/dL (ref 70–99)
Potassium: 3.8 mmol/L (ref 3.5–5.1)
Sodium: 138 mmol/L (ref 135–145)

## 2022-08-31 LAB — CBC
HCT: 44 % (ref 39.0–52.0)
Hemoglobin: 14.7 g/dL (ref 13.0–17.0)
MCH: 31.2 pg (ref 26.0–34.0)
MCHC: 33.4 g/dL (ref 30.0–36.0)
MCV: 93.4 fL (ref 80.0–100.0)
Platelets: 214 10*3/uL (ref 150–400)
RBC: 4.71 MIL/uL (ref 4.22–5.81)
RDW: 13.4 % (ref 11.5–15.5)
WBC: 10.4 10*3/uL (ref 4.0–10.5)
nRBC: 0 % (ref 0.0–0.2)

## 2022-08-31 LAB — HEPATIC FUNCTION PANEL
ALT: 25 U/L (ref 0–44)
AST: 28 U/L (ref 15–41)
Albumin: 3.5 g/dL (ref 3.5–5.0)
Alkaline Phosphatase: 45 U/L (ref 38–126)
Bilirubin, Direct: 0.1 mg/dL (ref 0.0–0.2)
Indirect Bilirubin: 0.7 mg/dL (ref 0.3–0.9)
Total Bilirubin: 0.8 mg/dL (ref 0.3–1.2)
Total Protein: 6.4 g/dL — ABNORMAL LOW (ref 6.5–8.1)

## 2022-08-31 LAB — URINALYSIS, ROUTINE W REFLEX MICROSCOPIC
Bilirubin Urine: NEGATIVE
Glucose, UA: NEGATIVE mg/dL
Hgb urine dipstick: NEGATIVE
Ketones, ur: NEGATIVE mg/dL
Leukocytes,Ua: NEGATIVE
Nitrite: NEGATIVE
Protein, ur: 30 mg/dL — AB
Specific Gravity, Urine: 1.023 (ref 1.005–1.030)
pH: 5 (ref 5.0–8.0)

## 2022-08-31 LAB — SARS CORONAVIRUS 2 BY RT PCR: SARS Coronavirus 2 by RT PCR: NEGATIVE

## 2022-08-31 LAB — LIPASE, BLOOD: Lipase: 50 U/L (ref 11–51)

## 2022-08-31 MED ORDER — PROPOFOL 10 MG/ML IV BOLUS
INTRAVENOUS | Status: AC
  Filled 2022-08-31: qty 20

## 2022-08-31 MED ORDER — FENTANYL CITRATE (PF) 100 MCG/2ML IJ SOLN
INTRAMUSCULAR | Status: AC
  Filled 2022-08-31: qty 2

## 2022-08-31 MED ORDER — LACTATED RINGERS IV BOLUS
1000.0000 mL | Freq: Once | INTRAVENOUS | Status: AC
  Administered 2022-08-31: 1000 mL via INTRAVENOUS

## 2022-08-31 MED ORDER — MIDAZOLAM HCL 2 MG/2ML IJ SOLN
INTRAMUSCULAR | Status: AC
  Filled 2022-08-31: qty 2

## 2022-08-31 NOTE — ED Provider Notes (Signed)
[facility] Provider Note    Event Date/Time   First MD Initiated Contact with Patient 08/31/22 [phone]     (approximate)   History   Altered Mental Status   HPI  Patrick Wilson is a 89 y.o. male past medical history hypertension MI GERD coronary artery disease who presents because of memory issues and difficulty ambulating.  Patient's family and him both note that over the weekend he had cold and hot sweats and felt very ill.  Was not eating much.  He did have several times when he slid out of bed onto his butt and neighbors had to help him up.  Says he does not remember most the weekend.  He has been improving however since that time.  Says he feels significantly better is still having some bilateral lower extremity weakness and difficulty ambulating.  Says he feels lightheaded upon standing.  He has had some cough and runny nose denies shortness of breath or chest pain.  Does endorse some mild upper abdominal pain.  He is hungry at the moment.  Denies vision change focal numbness.  Says that he is weak in bilateral lower extremities.     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) 07/23/2021   Aortic atherosclerosis (HCC) 01/01/2020   PAD (peripheral artery disease) (HCC) 03/06/2019   STEMI involving right coronary artery (HCC) 10/17/2018   STEMI (ST elevation myocardial infarction) (HCC) 10/17/2018   Medicare annual wellness visit, initial 06/08/2016   Ureteral calculus 11/24/2015   Arterial vascular disease 11/13/2015   Calculus of kidney 11/13/2015   Cervical disc disease 03/05/2014    Combined fat and carbohydrate induced hyperlipemia 03/05/2014   Essential (primary) hypertension 03/05/2014   History of cardiac catheterization 01/02/2014   ED (erectile dysfunction) of organic origin 03/04/2013   H/O urinary stone 03/04/2013     Physical Exam  Triage Vital Signs: ED Triage Vitals  Enc Vitals Group     BP 08/31/22 0954 113/78     Pulse Rate 08/31/22 0954 62     Resp 08/31/22 0954 20     Temp 08/31/22 0954 98.6 F (37 C)     Temp Source 08/31/22 0954 Oral     SpO2 08/31/22 0954 98 %     Weight 08/31/22 0946 163 lb (73.9 kg)     Height 08/31/22 0946 5\' 8"  (1.727 m)     Head Circumference --      Peak Flow --      Pain Score 08/31/22 0946 0     Pain Loc --      Pain Edu? --      Excl. in GC? --     Most recent vital signs: Vitals:   08/31/22 0954  BP: 113/78  Pulse: 62  Resp: 20  Temp: 98.6 F (37 C)  SpO2: 98%     General: Awake, no distress.  CV:  Good peripheral perfusion. Resp:  Normal effort.  Abd:  No distention. abdomen is soft and nontender throughout Neuro:  Awake, Alert, Oriented x 3  Other:  Aox3, nml speech  PERRL, EOMI, face symmetric, nml tongue movement  Patient does have several beats of vertical nystagmus on upward gaze 5/5 strength in the BL upper and lower extremities  Sensation grossly intact in the BL upper and lower extremities  Finger-nose-finger intact BL Patient is able to ambulate with assistance no ataxia   ED Results / Procedures / Treatments  Labs (all labs ordered are listed, but only abnormal results are displayed) Labs Reviewed  BASIC METABOLIC PANEL - Abnormal; Notable for the following components:      Result Value   BUN 32 (*)    Creatinine, Ser 1.54 (*)    GFR, Estimated 44 (*)    All other components within normal limits  URINALYSIS, ROUTINE W REFLEX MICROSCOPIC - Abnormal; Notable for the following components:   Color, Urine YELLOW (*)    APPearance CLEAR (*)    Protein, ur 30 (*)     Bacteria, UA RARE (*)    All other components within normal limits  HEPATIC FUNCTION PANEL - Abnormal; Notable for the following components:   Total Protein 6.4 (*)    All other components within normal limits  SARS CORONAVIRUS 2 BY RT PCR  CBC  LIPASE, BLOOD  CBG MONITORING, ED     EKG  EKG reviewed interpreted myself shows sinus rhythm with first-degree AV block inferior Q waves similar to prior no acute ischemic changes   RADIOLOGY I reviewed and interpreted the CT scan of the brain which does not show any acute intracranial process    PROCEDURES:  Critical Care performed: No  Procedures  The patient is on the cardiac monitor to evaluate for evidence of arrhythmia and/or significant heart rate changes.   MEDICATIONS ORDERED IN ED: Medications  lactated ringers bolus 1,000 mL (1,000 mLs Intravenous Bolus 08/31/22 1103)     IMPRESSION / MDM / ASSESSMENT AND PLAN / ED COURSE  I reviewed the triage vital signs and the nursing notes.                              Patient's presentation is most consistent with acute presentation with potential threat to life or bodily function.  Differential diagnosis includes, but is not limited to, posterior stroke, metabolic abnormality, infectious process such as viral illness UTI pneumonia  The patient is a 89 year old male who presents today because of weakness and difficulty with memory.  Most of his symptoms occurred this weekend apparently he was having hot and cold sweats onset he had fever was not eating and had several times when he slipped out of bed.  Denies head strike.  He does not remember much of the weekend.  Family says he was confused at the time.  Since Monday he has been improving and is now almost back to baseline.  Does endorse some bilateral lower extremity weakness.  He has had some cough but denies dyspnea denies chest pain.  He does endorse some mild upper abdominal pain but denies vomiting or diarrhea.  He denies  diplopia numbness or focal weakness that is just weak in the bilateral lower extremities.  Vital signs are reassuring.  Patient looks nontoxic.  He is alert and oriented x 3.  His only abnormal neurologic finding is several beats of vertical nystagmus on upward gaze.  He is however not ataxic and family says that his current gait is at its baseline he  was able to walk with his cane.  Abdominal exam is benign.  Labs including CBC, BMP and urinalysis are reassuring.  Urine has 6-10 white cells rare bacteria however he is not having urinary symptoms low suspicion for UTI.  Creatinine is near baseline.  CT head is negative for acute abnormality.  Given the vertical nystagmus I did obtain MRI of the brain to evaluate for posterior stroke and this was negative.  Chest x-ray shows bronchitic changes.    Given patient is improving and essentially back to baseline and his only abnormal neurologic finding is the nystagmus with no stroke on MRI I do feel that he can safely be discharged.  Discussed with him close return precautions for any new neurologic symptoms.  Recommend he follow-up with primary care.  My suspicion is that he likely had some sort of viral illness that is now resolving.  Did give a bolus of fluid in the ED as he was describing some orthostatic symptoms.      FINAL CLINICAL IMPRESSION(S) / ED DIAGNOSES   Final diagnoses:  Weakness     Rx / DC Orders   ED Discharge Orders     None        Note:  This document was prepared using Dragon voice recognition software and may include unintentional dictation errors.   Georga Hacking, MD 08/31/22 1329

## 2022-08-31 NOTE — ED Notes (Signed)
D/C and reasons to return to discussed with pt, pt and family verbalized understanding. NAD noted.

## 2022-08-31 NOTE — ED Triage Notes (Signed)
Pt via POV from home. Per daughter pt has been having increased confusion since Friday. Pt was sent over by Dr. Hyacinth Meeker for possible stroke. Denies pain. States he does feel lightheaded and dizzy. States he has been having increased falls but denies any head injury associated with it. Pt is A&OX4 and NAD at this time.

## 2022-08-31 NOTE — ED Notes (Signed)
Pt presents to ED with family with concerns of possible TIA due to bouts of confusion and increasing falls a since Thursday.   Pt denies hitting head or any injury due to these falls. Pt does have HX of dementia but is A&Ox4 at this time. Pt denies any pain or complaints to his RN other than "I can't remember anything over the past few days". NAD noted.

## 2022-08-31 NOTE — Discharge Instructions (Signed)
Your MRI did not demonstrate any acute stroke.  Your blood work overall was reassuring.  You likely had a viral illness

## 2022-09-09 ENCOUNTER — Other Ambulatory Visit (INDEPENDENT_AMBULATORY_CARE_PROVIDER_SITE_OTHER): Payer: Self-pay | Admitting: Vascular Surgery

## 2022-09-09 DIAGNOSIS — I739 Peripheral vascular disease, unspecified: Secondary | ICD-10-CM

## 2022-09-13 ENCOUNTER — Encounter (INDEPENDENT_AMBULATORY_CARE_PROVIDER_SITE_OTHER): Payer: Medicare HMO

## 2022-09-13 ENCOUNTER — Ambulatory Visit (INDEPENDENT_AMBULATORY_CARE_PROVIDER_SITE_OTHER): Payer: Medicare HMO | Admitting: Vascular Surgery

## 2022-10-10 DIAGNOSIS — R531 Weakness: Secondary | ICD-10-CM | POA: Diagnosis not present

## 2022-10-10 DIAGNOSIS — I739 Peripheral vascular disease, unspecified: Secondary | ICD-10-CM | POA: Diagnosis not present

## 2022-10-10 DIAGNOSIS — F03A0 Unspecified dementia, mild, without behavioral disturbance, psychotic disturbance, mood disturbance, and anxiety: Secondary | ICD-10-CM | POA: Diagnosis not present

## 2022-10-10 DIAGNOSIS — N183 Chronic kidney disease, stage 3 unspecified: Secondary | ICD-10-CM | POA: Diagnosis not present

## 2022-10-12 DIAGNOSIS — Z6825 Body mass index (BMI) 25.0-25.9, adult: Secondary | ICD-10-CM | POA: Diagnosis not present

## 2022-10-12 DIAGNOSIS — R634 Abnormal weight loss: Secondary | ICD-10-CM | POA: Diagnosis not present

## 2022-10-12 DIAGNOSIS — E785 Hyperlipidemia, unspecified: Secondary | ICD-10-CM | POA: Diagnosis not present

## 2022-10-12 DIAGNOSIS — I739 Peripheral vascular disease, unspecified: Secondary | ICD-10-CM | POA: Diagnosis not present

## 2022-10-12 DIAGNOSIS — I129 Hypertensive chronic kidney disease with stage 1 through stage 4 chronic kidney disease, or unspecified chronic kidney disease: Secondary | ICD-10-CM | POA: Diagnosis not present

## 2022-10-12 DIAGNOSIS — I2510 Atherosclerotic heart disease of native coronary artery without angina pectoris: Secondary | ICD-10-CM | POA: Diagnosis not present

## 2022-10-12 DIAGNOSIS — F03A0 Unspecified dementia, mild, without behavioral disturbance, psychotic disturbance, mood disturbance, and anxiety: Secondary | ICD-10-CM | POA: Diagnosis not present

## 2022-10-12 DIAGNOSIS — N183 Chronic kidney disease, stage 3 unspecified: Secondary | ICD-10-CM | POA: Diagnosis not present

## 2022-10-12 DIAGNOSIS — N400 Enlarged prostate without lower urinary tract symptoms: Secondary | ICD-10-CM | POA: Diagnosis not present

## 2022-10-13 DIAGNOSIS — M62830 Muscle spasm of back: Secondary | ICD-10-CM | POA: Diagnosis not present

## 2022-10-13 DIAGNOSIS — M9903 Segmental and somatic dysfunction of lumbar region: Secondary | ICD-10-CM | POA: Diagnosis not present

## 2022-10-13 DIAGNOSIS — M955 Acquired deformity of pelvis: Secondary | ICD-10-CM | POA: Diagnosis not present

## 2022-10-13 DIAGNOSIS — M9905 Segmental and somatic dysfunction of pelvic region: Secondary | ICD-10-CM | POA: Diagnosis not present

## 2022-10-17 DIAGNOSIS — R634 Abnormal weight loss: Secondary | ICD-10-CM | POA: Diagnosis not present

## 2022-10-17 DIAGNOSIS — I129 Hypertensive chronic kidney disease with stage 1 through stage 4 chronic kidney disease, or unspecified chronic kidney disease: Secondary | ICD-10-CM | POA: Diagnosis not present

## 2022-10-17 DIAGNOSIS — N400 Enlarged prostate without lower urinary tract symptoms: Secondary | ICD-10-CM | POA: Diagnosis not present

## 2022-10-17 DIAGNOSIS — Z6825 Body mass index (BMI) 25.0-25.9, adult: Secondary | ICD-10-CM | POA: Diagnosis not present

## 2022-10-17 DIAGNOSIS — F03A0 Unspecified dementia, mild, without behavioral disturbance, psychotic disturbance, mood disturbance, and anxiety: Secondary | ICD-10-CM | POA: Diagnosis not present

## 2022-10-17 DIAGNOSIS — I739 Peripheral vascular disease, unspecified: Secondary | ICD-10-CM | POA: Diagnosis not present

## 2022-10-17 DIAGNOSIS — N183 Chronic kidney disease, stage 3 unspecified: Secondary | ICD-10-CM | POA: Diagnosis not present

## 2022-10-17 DIAGNOSIS — I2510 Atherosclerotic heart disease of native coronary artery without angina pectoris: Secondary | ICD-10-CM | POA: Diagnosis not present

## 2022-10-17 DIAGNOSIS — E785 Hyperlipidemia, unspecified: Secondary | ICD-10-CM | POA: Diagnosis not present

## 2022-10-17 IMAGING — CT CT MAXILLOFACIAL W/O CM
3 of 5 series · 15 of 47 positions shown, 18 images · non-contrast
Comparison: None.

CLINICAL DATA: Sinus drainage with frontal headaches.

EXAM:
CT MAXILLOFACIAL WITHOUT CONTRAST
TECHNIQUE: Multidetector CT imaging of the maxillofacial structures was
performed. Multiplanar CT image reconstructions were also generated.

[Series 7: sinus 2.00 cor · coronal · 0.25mm/px · 3 of 70 slices shown]
[im 24/70  bone]
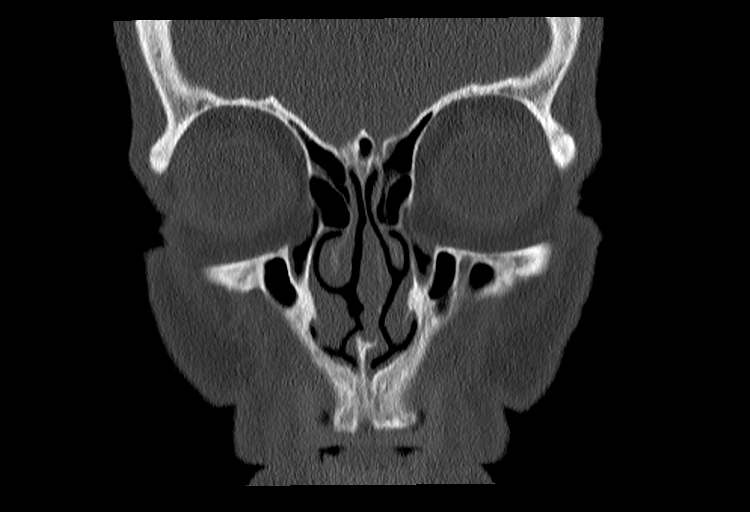
[im 31/70  bone]
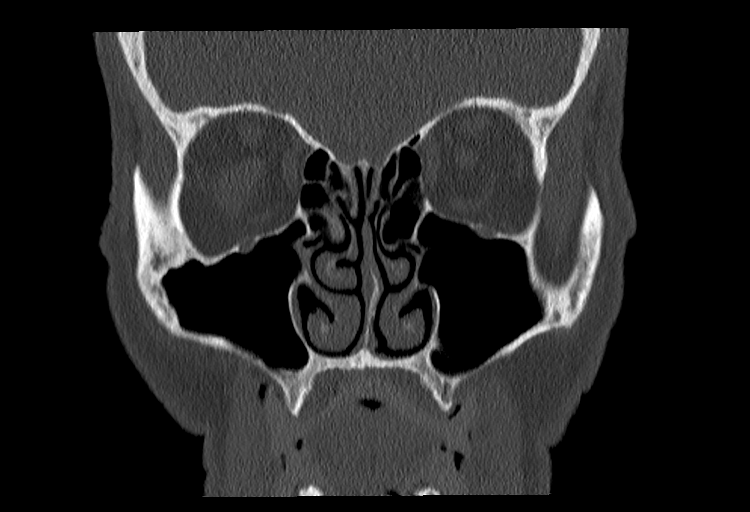
[im 39/70  bone]
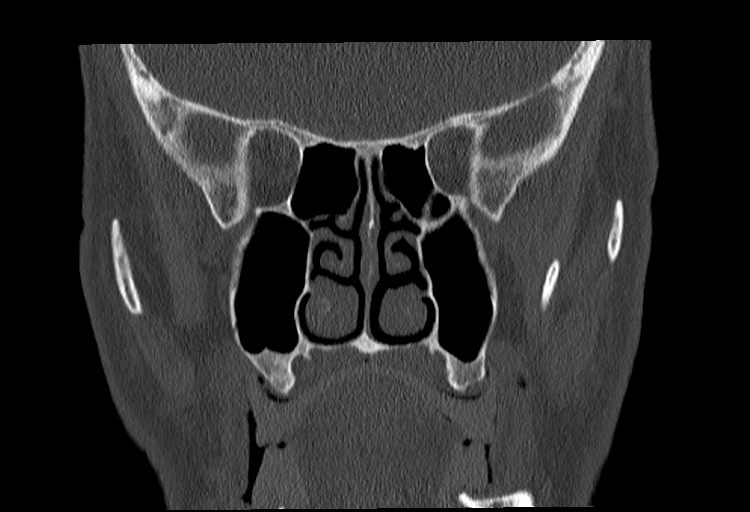

[Series 9: sinus 2.00 sag · sagittal · 0.25mm/px · 3 of 94 slices shown]
[im 32/94  bone]
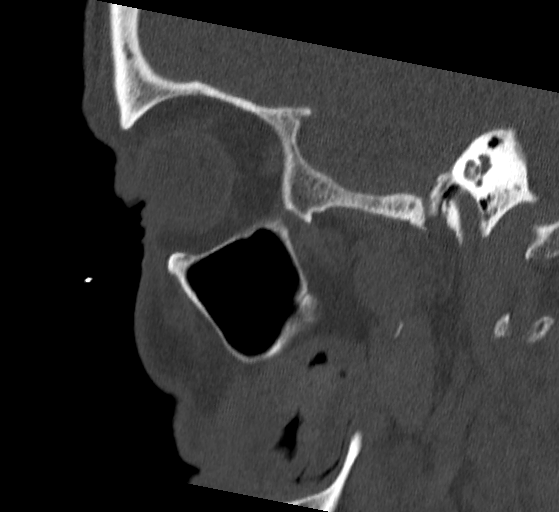
[im 47/94  bone]
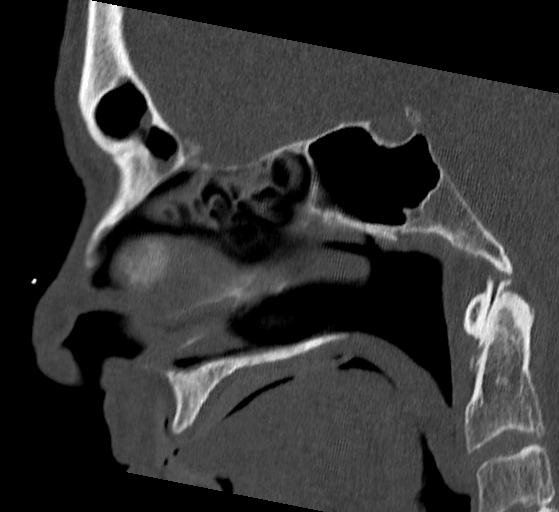
[im 63/94  bone]
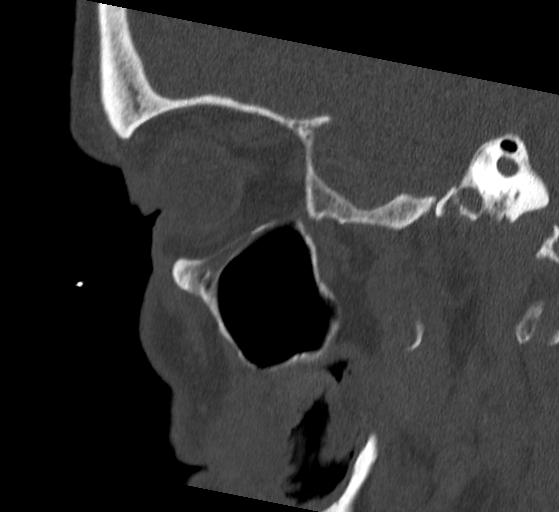

[Series 11: thins sinus 1.00 · axial · 0.32mm/px · z∈[-543,-451]mm · 9 of 110 slices shown, 12 images]
[im 9/110  brain]
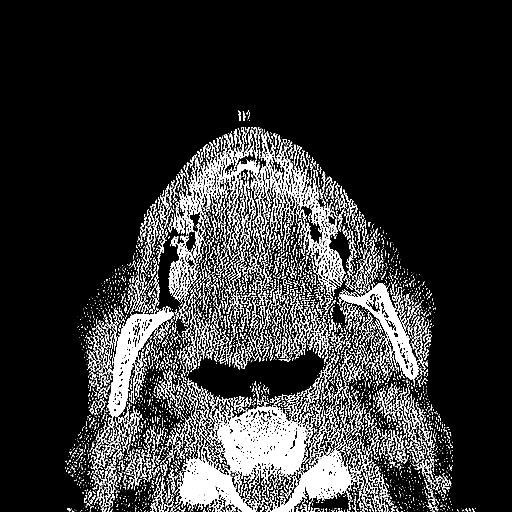
[im 9/110  bone]
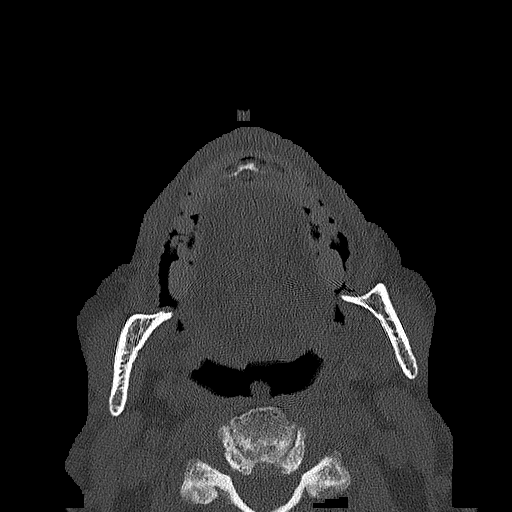
[im 26/110  bone]
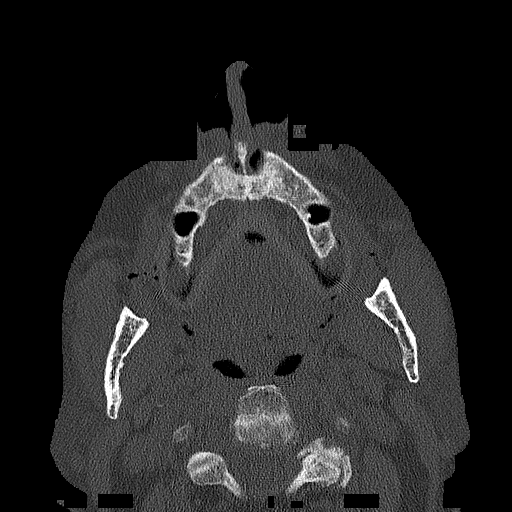
[im 34/110  bone]
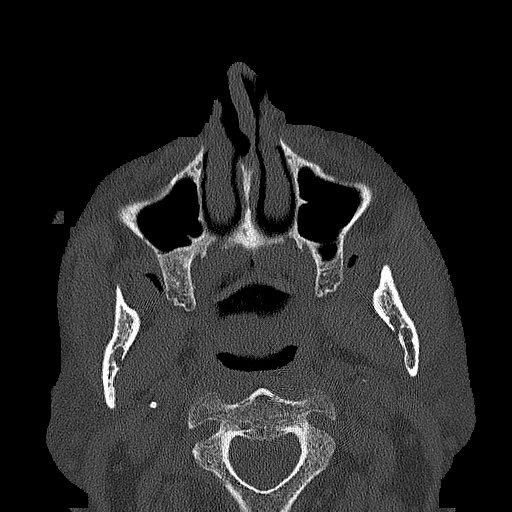
[im 42/110  bone]
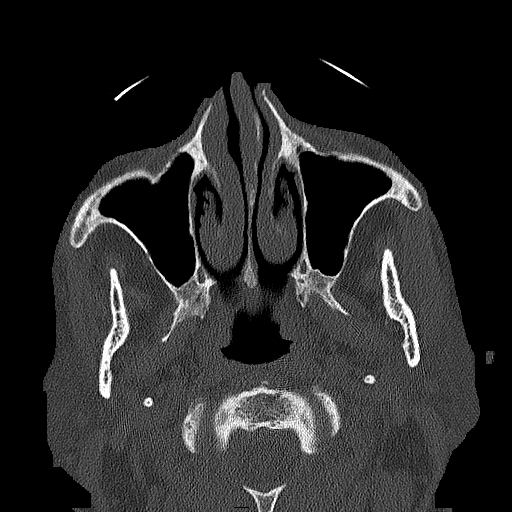
[im 59/110  brain]
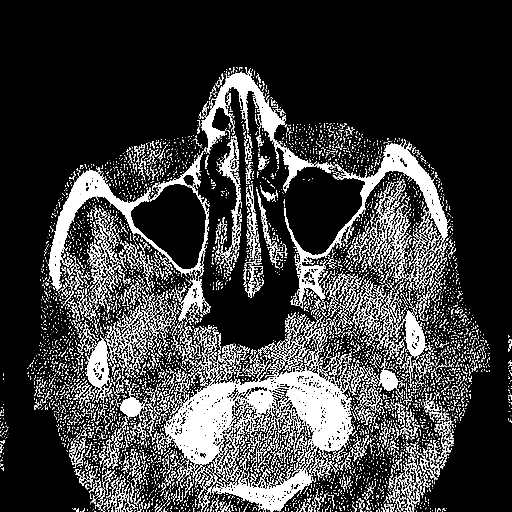
[im 59/110  bone]
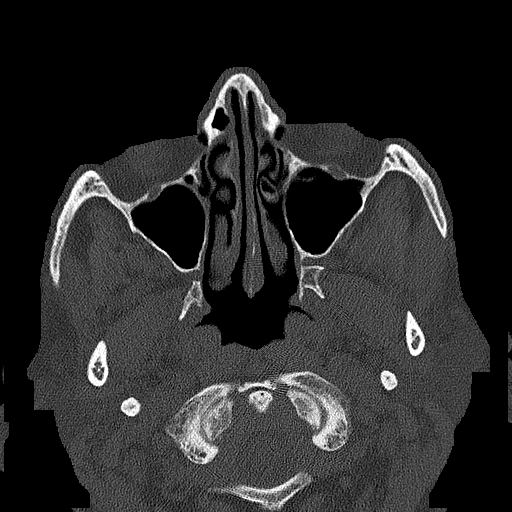
[im 68/110  bone]
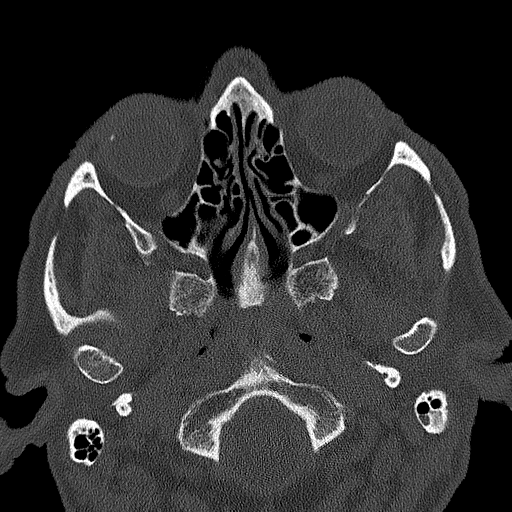
[im 76/110  bone]
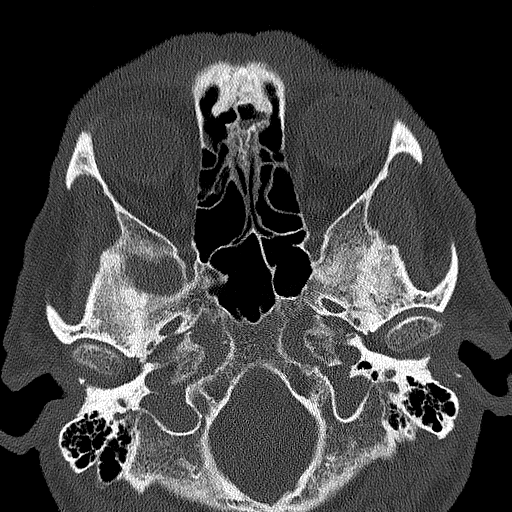
[im 93/110  bone]
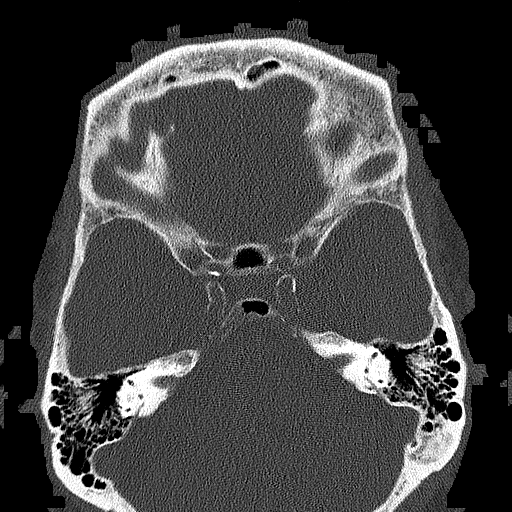
[im 101/110  brain]
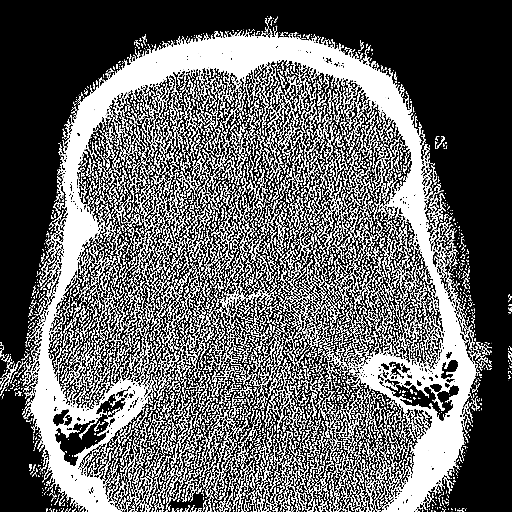
[im 101/110  bone]
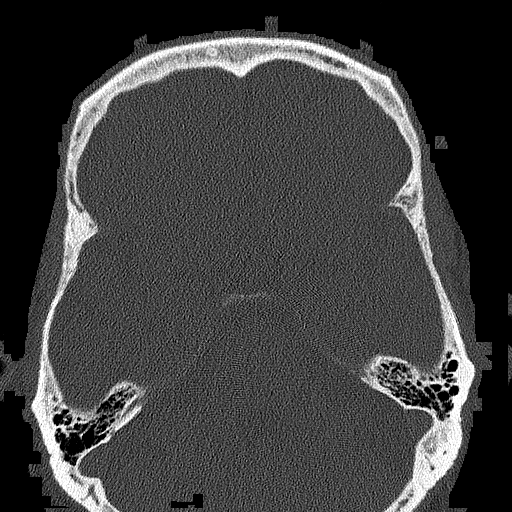

[15 of 47 positions shown; findings below may reference images not displayed]

FINDINGS: Osseous: No fracture or mandibular dislocation. No destructive
process.

Orbits: Negative. No traumatic or inflammatory finding.

Sinuses: Mild mucosal thickening of the left frontal sinus and a few
ethmoid air cells. Otherwise, the sinuses are clear. No air-fluid
levels. Mild leftward nasal septal deviation.

Soft tissues: Negative.

Limited intracranial: No significant or unexpected finding.

Other: No mastoid effusions.
IMPRESSION: No substantial paranasal sinus disease.

## 2022-10-20 DIAGNOSIS — R634 Abnormal weight loss: Secondary | ICD-10-CM | POA: Diagnosis not present

## 2022-10-20 DIAGNOSIS — Z6825 Body mass index (BMI) 25.0-25.9, adult: Secondary | ICD-10-CM | POA: Diagnosis not present

## 2022-10-20 DIAGNOSIS — I129 Hypertensive chronic kidney disease with stage 1 through stage 4 chronic kidney disease, or unspecified chronic kidney disease: Secondary | ICD-10-CM | POA: Diagnosis not present

## 2022-10-20 DIAGNOSIS — I2510 Atherosclerotic heart disease of native coronary artery without angina pectoris: Secondary | ICD-10-CM | POA: Diagnosis not present

## 2022-10-20 DIAGNOSIS — F03A0 Unspecified dementia, mild, without behavioral disturbance, psychotic disturbance, mood disturbance, and anxiety: Secondary | ICD-10-CM | POA: Diagnosis not present

## 2022-10-20 DIAGNOSIS — E785 Hyperlipidemia, unspecified: Secondary | ICD-10-CM | POA: Diagnosis not present

## 2022-10-20 DIAGNOSIS — I739 Peripheral vascular disease, unspecified: Secondary | ICD-10-CM | POA: Diagnosis not present

## 2022-10-20 DIAGNOSIS — N400 Enlarged prostate without lower urinary tract symptoms: Secondary | ICD-10-CM | POA: Diagnosis not present

## 2022-10-20 DIAGNOSIS — N183 Chronic kidney disease, stage 3 unspecified: Secondary | ICD-10-CM | POA: Diagnosis not present

## 2022-10-21 DIAGNOSIS — I2510 Atherosclerotic heart disease of native coronary artery without angina pectoris: Secondary | ICD-10-CM | POA: Diagnosis not present

## 2022-10-21 DIAGNOSIS — E785 Hyperlipidemia, unspecified: Secondary | ICD-10-CM | POA: Diagnosis not present

## 2022-10-21 DIAGNOSIS — F03A0 Unspecified dementia, mild, without behavioral disturbance, psychotic disturbance, mood disturbance, and anxiety: Secondary | ICD-10-CM | POA: Diagnosis not present

## 2022-10-21 DIAGNOSIS — N183 Chronic kidney disease, stage 3 unspecified: Secondary | ICD-10-CM | POA: Diagnosis not present

## 2022-10-21 DIAGNOSIS — I739 Peripheral vascular disease, unspecified: Secondary | ICD-10-CM | POA: Diagnosis not present

## 2022-10-21 DIAGNOSIS — I129 Hypertensive chronic kidney disease with stage 1 through stage 4 chronic kidney disease, or unspecified chronic kidney disease: Secondary | ICD-10-CM | POA: Diagnosis not present

## 2022-10-21 DIAGNOSIS — N400 Enlarged prostate without lower urinary tract symptoms: Secondary | ICD-10-CM | POA: Diagnosis not present

## 2022-10-25 DIAGNOSIS — I2510 Atherosclerotic heart disease of native coronary artery without angina pectoris: Secondary | ICD-10-CM | POA: Diagnosis not present

## 2022-10-25 DIAGNOSIS — I739 Peripheral vascular disease, unspecified: Secondary | ICD-10-CM | POA: Diagnosis not present

## 2022-10-25 DIAGNOSIS — Z6825 Body mass index (BMI) 25.0-25.9, adult: Secondary | ICD-10-CM | POA: Diagnosis not present

## 2022-10-25 DIAGNOSIS — N400 Enlarged prostate without lower urinary tract symptoms: Secondary | ICD-10-CM | POA: Diagnosis not present

## 2022-10-25 DIAGNOSIS — E785 Hyperlipidemia, unspecified: Secondary | ICD-10-CM | POA: Diagnosis not present

## 2022-10-25 DIAGNOSIS — F03A0 Unspecified dementia, mild, without behavioral disturbance, psychotic disturbance, mood disturbance, and anxiety: Secondary | ICD-10-CM | POA: Diagnosis not present

## 2022-10-25 DIAGNOSIS — I129 Hypertensive chronic kidney disease with stage 1 through stage 4 chronic kidney disease, or unspecified chronic kidney disease: Secondary | ICD-10-CM | POA: Diagnosis not present

## 2022-10-25 DIAGNOSIS — R634 Abnormal weight loss: Secondary | ICD-10-CM | POA: Diagnosis not present

## 2022-10-25 DIAGNOSIS — N183 Chronic kidney disease, stage 3 unspecified: Secondary | ICD-10-CM | POA: Diagnosis not present

## 2022-11-01 DIAGNOSIS — I129 Hypertensive chronic kidney disease with stage 1 through stage 4 chronic kidney disease, or unspecified chronic kidney disease: Secondary | ICD-10-CM | POA: Diagnosis not present

## 2022-11-01 DIAGNOSIS — N400 Enlarged prostate without lower urinary tract symptoms: Secondary | ICD-10-CM | POA: Diagnosis not present

## 2022-11-01 DIAGNOSIS — Z6825 Body mass index (BMI) 25.0-25.9, adult: Secondary | ICD-10-CM | POA: Diagnosis not present

## 2022-11-01 DIAGNOSIS — I739 Peripheral vascular disease, unspecified: Secondary | ICD-10-CM | POA: Diagnosis not present

## 2022-11-01 DIAGNOSIS — R634 Abnormal weight loss: Secondary | ICD-10-CM | POA: Diagnosis not present

## 2022-11-01 DIAGNOSIS — I2510 Atherosclerotic heart disease of native coronary artery without angina pectoris: Secondary | ICD-10-CM | POA: Diagnosis not present

## 2022-11-01 DIAGNOSIS — N183 Chronic kidney disease, stage 3 unspecified: Secondary | ICD-10-CM | POA: Diagnosis not present

## 2022-11-01 DIAGNOSIS — F03A0 Unspecified dementia, mild, without behavioral disturbance, psychotic disturbance, mood disturbance, and anxiety: Secondary | ICD-10-CM | POA: Diagnosis not present

## 2022-11-01 DIAGNOSIS — E785 Hyperlipidemia, unspecified: Secondary | ICD-10-CM | POA: Diagnosis not present

## 2022-11-09 DIAGNOSIS — Z6825 Body mass index (BMI) 25.0-25.9, adult: Secondary | ICD-10-CM | POA: Diagnosis not present

## 2022-11-09 DIAGNOSIS — I129 Hypertensive chronic kidney disease with stage 1 through stage 4 chronic kidney disease, or unspecified chronic kidney disease: Secondary | ICD-10-CM | POA: Diagnosis not present

## 2022-11-09 DIAGNOSIS — R634 Abnormal weight loss: Secondary | ICD-10-CM | POA: Diagnosis not present

## 2022-11-09 DIAGNOSIS — N183 Chronic kidney disease, stage 3 unspecified: Secondary | ICD-10-CM | POA: Diagnosis not present

## 2022-11-09 DIAGNOSIS — I2510 Atherosclerotic heart disease of native coronary artery without angina pectoris: Secondary | ICD-10-CM | POA: Diagnosis not present

## 2022-11-09 DIAGNOSIS — E785 Hyperlipidemia, unspecified: Secondary | ICD-10-CM | POA: Diagnosis not present

## 2022-11-09 DIAGNOSIS — N400 Enlarged prostate without lower urinary tract symptoms: Secondary | ICD-10-CM | POA: Diagnosis not present

## 2022-11-09 DIAGNOSIS — I739 Peripheral vascular disease, unspecified: Secondary | ICD-10-CM | POA: Diagnosis not present

## 2022-11-09 DIAGNOSIS — F03A0 Unspecified dementia, mild, without behavioral disturbance, psychotic disturbance, mood disturbance, and anxiety: Secondary | ICD-10-CM | POA: Diagnosis not present

## 2022-11-11 DIAGNOSIS — E785 Hyperlipidemia, unspecified: Secondary | ICD-10-CM | POA: Diagnosis not present

## 2022-11-11 DIAGNOSIS — I739 Peripheral vascular disease, unspecified: Secondary | ICD-10-CM | POA: Diagnosis not present

## 2022-11-11 DIAGNOSIS — F03A0 Unspecified dementia, mild, without behavioral disturbance, psychotic disturbance, mood disturbance, and anxiety: Secondary | ICD-10-CM | POA: Diagnosis not present

## 2022-11-11 DIAGNOSIS — I129 Hypertensive chronic kidney disease with stage 1 through stage 4 chronic kidney disease, or unspecified chronic kidney disease: Secondary | ICD-10-CM | POA: Diagnosis not present

## 2022-11-11 DIAGNOSIS — I2510 Atherosclerotic heart disease of native coronary artery without angina pectoris: Secondary | ICD-10-CM | POA: Diagnosis not present

## 2022-11-11 DIAGNOSIS — Z6825 Body mass index (BMI) 25.0-25.9, adult: Secondary | ICD-10-CM | POA: Diagnosis not present

## 2022-11-11 DIAGNOSIS — R634 Abnormal weight loss: Secondary | ICD-10-CM | POA: Diagnosis not present

## 2022-11-11 DIAGNOSIS — N400 Enlarged prostate without lower urinary tract symptoms: Secondary | ICD-10-CM | POA: Diagnosis not present

## 2022-11-11 DIAGNOSIS — N183 Chronic kidney disease, stage 3 unspecified: Secondary | ICD-10-CM | POA: Diagnosis not present

## 2022-11-15 DIAGNOSIS — N183 Chronic kidney disease, stage 3 unspecified: Secondary | ICD-10-CM | POA: Diagnosis not present

## 2022-11-15 DIAGNOSIS — I739 Peripheral vascular disease, unspecified: Secondary | ICD-10-CM | POA: Diagnosis not present

## 2022-11-15 DIAGNOSIS — N400 Enlarged prostate without lower urinary tract symptoms: Secondary | ICD-10-CM | POA: Diagnosis not present

## 2022-11-15 DIAGNOSIS — I2510 Atherosclerotic heart disease of native coronary artery without angina pectoris: Secondary | ICD-10-CM | POA: Diagnosis not present

## 2022-11-15 DIAGNOSIS — F03A0 Unspecified dementia, mild, without behavioral disturbance, psychotic disturbance, mood disturbance, and anxiety: Secondary | ICD-10-CM | POA: Diagnosis not present

## 2022-11-15 DIAGNOSIS — Z6825 Body mass index (BMI) 25.0-25.9, adult: Secondary | ICD-10-CM | POA: Diagnosis not present

## 2022-11-15 DIAGNOSIS — I129 Hypertensive chronic kidney disease with stage 1 through stage 4 chronic kidney disease, or unspecified chronic kidney disease: Secondary | ICD-10-CM | POA: Diagnosis not present

## 2022-11-15 DIAGNOSIS — R634 Abnormal weight loss: Secondary | ICD-10-CM | POA: Diagnosis not present

## 2022-11-15 DIAGNOSIS — E785 Hyperlipidemia, unspecified: Secondary | ICD-10-CM | POA: Diagnosis not present

## 2022-11-23 DIAGNOSIS — N183 Chronic kidney disease, stage 3 unspecified: Secondary | ICD-10-CM | POA: Diagnosis not present

## 2022-11-23 DIAGNOSIS — Z6825 Body mass index (BMI) 25.0-25.9, adult: Secondary | ICD-10-CM | POA: Diagnosis not present

## 2022-11-23 DIAGNOSIS — I129 Hypertensive chronic kidney disease with stage 1 through stage 4 chronic kidney disease, or unspecified chronic kidney disease: Secondary | ICD-10-CM | POA: Diagnosis not present

## 2022-11-23 DIAGNOSIS — F03A0 Unspecified dementia, mild, without behavioral disturbance, psychotic disturbance, mood disturbance, and anxiety: Secondary | ICD-10-CM | POA: Diagnosis not present

## 2022-11-23 DIAGNOSIS — R634 Abnormal weight loss: Secondary | ICD-10-CM | POA: Diagnosis not present

## 2022-11-23 DIAGNOSIS — I739 Peripheral vascular disease, unspecified: Secondary | ICD-10-CM | POA: Diagnosis not present

## 2022-11-23 DIAGNOSIS — N400 Enlarged prostate without lower urinary tract symptoms: Secondary | ICD-10-CM | POA: Diagnosis not present

## 2022-11-23 DIAGNOSIS — E785 Hyperlipidemia, unspecified: Secondary | ICD-10-CM | POA: Diagnosis not present

## 2022-11-23 DIAGNOSIS — I2510 Atherosclerotic heart disease of native coronary artery without angina pectoris: Secondary | ICD-10-CM | POA: Diagnosis not present

## 2022-11-24 DIAGNOSIS — E782 Mixed hyperlipidemia: Secondary | ICD-10-CM | POA: Diagnosis not present

## 2022-11-24 DIAGNOSIS — I2510 Atherosclerotic heart disease of native coronary artery without angina pectoris: Secondary | ICD-10-CM | POA: Diagnosis not present

## 2022-11-24 DIAGNOSIS — I2119 ST elevation (STEMI) myocardial infarction involving other coronary artery of inferior wall: Secondary | ICD-10-CM | POA: Diagnosis not present

## 2022-11-28 DIAGNOSIS — I739 Peripheral vascular disease, unspecified: Secondary | ICD-10-CM | POA: Diagnosis not present

## 2022-11-28 DIAGNOSIS — Z6825 Body mass index (BMI) 25.0-25.9, adult: Secondary | ICD-10-CM | POA: Diagnosis not present

## 2022-11-28 DIAGNOSIS — R634 Abnormal weight loss: Secondary | ICD-10-CM | POA: Diagnosis not present

## 2022-11-28 DIAGNOSIS — N183 Chronic kidney disease, stage 3 unspecified: Secondary | ICD-10-CM | POA: Diagnosis not present

## 2022-11-28 DIAGNOSIS — I2510 Atherosclerotic heart disease of native coronary artery without angina pectoris: Secondary | ICD-10-CM | POA: Diagnosis not present

## 2022-11-28 DIAGNOSIS — N400 Enlarged prostate without lower urinary tract symptoms: Secondary | ICD-10-CM | POA: Diagnosis not present

## 2022-11-28 DIAGNOSIS — E785 Hyperlipidemia, unspecified: Secondary | ICD-10-CM | POA: Diagnosis not present

## 2022-11-28 DIAGNOSIS — I129 Hypertensive chronic kidney disease with stage 1 through stage 4 chronic kidney disease, or unspecified chronic kidney disease: Secondary | ICD-10-CM | POA: Diagnosis not present

## 2022-11-28 DIAGNOSIS — F03A0 Unspecified dementia, mild, without behavioral disturbance, psychotic disturbance, mood disturbance, and anxiety: Secondary | ICD-10-CM | POA: Diagnosis not present

## 2022-12-06 DIAGNOSIS — N183 Chronic kidney disease, stage 3 unspecified: Secondary | ICD-10-CM | POA: Diagnosis not present

## 2022-12-06 DIAGNOSIS — R634 Abnormal weight loss: Secondary | ICD-10-CM | POA: Diagnosis not present

## 2022-12-06 DIAGNOSIS — Z6825 Body mass index (BMI) 25.0-25.9, adult: Secondary | ICD-10-CM | POA: Diagnosis not present

## 2022-12-06 DIAGNOSIS — F03A0 Unspecified dementia, mild, without behavioral disturbance, psychotic disturbance, mood disturbance, and anxiety: Secondary | ICD-10-CM | POA: Diagnosis not present

## 2022-12-06 DIAGNOSIS — I739 Peripheral vascular disease, unspecified: Secondary | ICD-10-CM | POA: Diagnosis not present

## 2022-12-06 DIAGNOSIS — E785 Hyperlipidemia, unspecified: Secondary | ICD-10-CM | POA: Diagnosis not present

## 2022-12-06 DIAGNOSIS — N400 Enlarged prostate without lower urinary tract symptoms: Secondary | ICD-10-CM | POA: Diagnosis not present

## 2022-12-06 DIAGNOSIS — I129 Hypertensive chronic kidney disease with stage 1 through stage 4 chronic kidney disease, or unspecified chronic kidney disease: Secondary | ICD-10-CM | POA: Diagnosis not present

## 2022-12-06 DIAGNOSIS — I2510 Atherosclerotic heart disease of native coronary artery without angina pectoris: Secondary | ICD-10-CM | POA: Diagnosis not present

## 2023-02-08 DIAGNOSIS — S41112A Laceration without foreign body of left upper arm, initial encounter: Secondary | ICD-10-CM | POA: Diagnosis not present

## 2023-02-08 DIAGNOSIS — G309 Alzheimer's disease, unspecified: Secondary | ICD-10-CM | POA: Diagnosis not present

## 2023-02-08 DIAGNOSIS — N184 Chronic kidney disease, stage 4 (severe): Secondary | ICD-10-CM | POA: Diagnosis not present

## 2023-02-08 DIAGNOSIS — F02A0 Dementia in other diseases classified elsewhere, mild, without behavioral disturbance, psychotic disturbance, mood disturbance, and anxiety: Secondary | ICD-10-CM | POA: Diagnosis not present

## 2023-02-08 DIAGNOSIS — Z23 Encounter for immunization: Secondary | ICD-10-CM | POA: Diagnosis not present

## 2023-02-08 DIAGNOSIS — E782 Mixed hyperlipidemia: Secondary | ICD-10-CM | POA: Diagnosis not present

## 2023-02-22 DIAGNOSIS — D2271 Melanocytic nevi of right lower limb, including hip: Secondary | ICD-10-CM | POA: Diagnosis not present

## 2023-02-22 DIAGNOSIS — Z08 Encounter for follow-up examination after completed treatment for malignant neoplasm: Secondary | ICD-10-CM | POA: Diagnosis not present

## 2023-02-22 DIAGNOSIS — D2261 Melanocytic nevi of right upper limb, including shoulder: Secondary | ICD-10-CM | POA: Diagnosis not present

## 2023-02-22 DIAGNOSIS — D2272 Melanocytic nevi of left lower limb, including hip: Secondary | ICD-10-CM | POA: Diagnosis not present

## 2023-02-22 DIAGNOSIS — L728 Other follicular cysts of the skin and subcutaneous tissue: Secondary | ICD-10-CM | POA: Diagnosis not present

## 2023-02-22 DIAGNOSIS — L821 Other seborrheic keratosis: Secondary | ICD-10-CM | POA: Diagnosis not present

## 2023-02-22 DIAGNOSIS — Z85828 Personal history of other malignant neoplasm of skin: Secondary | ICD-10-CM | POA: Diagnosis not present

## 2023-02-22 DIAGNOSIS — D225 Melanocytic nevi of trunk: Secondary | ICD-10-CM | POA: Diagnosis not present

## 2023-02-22 DIAGNOSIS — M9903 Segmental and somatic dysfunction of lumbar region: Secondary | ICD-10-CM | POA: Diagnosis not present

## 2023-02-22 DIAGNOSIS — M955 Acquired deformity of pelvis: Secondary | ICD-10-CM | POA: Diagnosis not present

## 2023-02-22 DIAGNOSIS — D2262 Melanocytic nevi of left upper limb, including shoulder: Secondary | ICD-10-CM | POA: Diagnosis not present

## 2023-02-22 DIAGNOSIS — M9905 Segmental and somatic dysfunction of pelvic region: Secondary | ICD-10-CM | POA: Diagnosis not present

## 2023-02-22 DIAGNOSIS — M62830 Muscle spasm of back: Secondary | ICD-10-CM | POA: Diagnosis not present

## 2023-02-27 DIAGNOSIS — M955 Acquired deformity of pelvis: Secondary | ICD-10-CM | POA: Diagnosis not present

## 2023-02-27 DIAGNOSIS — M9903 Segmental and somatic dysfunction of lumbar region: Secondary | ICD-10-CM | POA: Diagnosis not present

## 2023-02-27 DIAGNOSIS — M9905 Segmental and somatic dysfunction of pelvic region: Secondary | ICD-10-CM | POA: Diagnosis not present

## 2023-02-27 DIAGNOSIS — M62830 Muscle spasm of back: Secondary | ICD-10-CM | POA: Diagnosis not present

## 2023-03-23 DIAGNOSIS — E782 Mixed hyperlipidemia: Secondary | ICD-10-CM | POA: Diagnosis not present

## 2023-03-23 DIAGNOSIS — N184 Chronic kidney disease, stage 4 (severe): Secondary | ICD-10-CM | POA: Diagnosis not present

## 2023-03-23 DIAGNOSIS — I2119 ST elevation (STEMI) myocardial infarction involving other coronary artery of inferior wall: Secondary | ICD-10-CM | POA: Diagnosis not present

## 2023-03-23 DIAGNOSIS — I2510 Atherosclerotic heart disease of native coronary artery without angina pectoris: Secondary | ICD-10-CM | POA: Diagnosis not present

## 2023-05-29 DIAGNOSIS — G309 Alzheimer's disease, unspecified: Secondary | ICD-10-CM | POA: Diagnosis not present

## 2023-05-29 DIAGNOSIS — N183 Chronic kidney disease, stage 3 unspecified: Secondary | ICD-10-CM | POA: Diagnosis not present

## 2023-05-29 DIAGNOSIS — F02A0 Dementia in other diseases classified elsewhere, mild, without behavioral disturbance, psychotic disturbance, mood disturbance, and anxiety: Secondary | ICD-10-CM | POA: Diagnosis not present

## 2023-05-29 DIAGNOSIS — I129 Hypertensive chronic kidney disease with stage 1 through stage 4 chronic kidney disease, or unspecified chronic kidney disease: Secondary | ICD-10-CM | POA: Diagnosis not present

## 2023-08-02 DIAGNOSIS — J45909 Unspecified asthma, uncomplicated: Secondary | ICD-10-CM | POA: Diagnosis not present

## 2023-08-02 DIAGNOSIS — Z1331 Encounter for screening for depression: Secondary | ICD-10-CM | POA: Diagnosis not present

## 2023-08-02 DIAGNOSIS — F0390 Unspecified dementia without behavioral disturbance: Secondary | ICD-10-CM | POA: Diagnosis not present

## 2023-08-02 DIAGNOSIS — Z0000 Encounter for general adult medical examination without abnormal findings: Secondary | ICD-10-CM | POA: Diagnosis not present

## 2023-08-02 DIAGNOSIS — Z125 Encounter for screening for malignant neoplasm of prostate: Secondary | ICD-10-CM | POA: Diagnosis not present

## 2023-08-02 DIAGNOSIS — E782 Mixed hyperlipidemia: Secondary | ICD-10-CM | POA: Diagnosis not present

## 2023-09-21 DIAGNOSIS — I739 Peripheral vascular disease, unspecified: Secondary | ICD-10-CM | POA: Diagnosis not present

## 2023-09-21 DIAGNOSIS — I2510 Atherosclerotic heart disease of native coronary artery without angina pectoris: Secondary | ICD-10-CM | POA: Diagnosis not present

## 2023-09-21 DIAGNOSIS — E782 Mixed hyperlipidemia: Secondary | ICD-10-CM | POA: Diagnosis not present

## 2023-09-21 DIAGNOSIS — I2119 ST elevation (STEMI) myocardial infarction involving other coronary artery of inferior wall: Secondary | ICD-10-CM | POA: Diagnosis not present

## 2023-09-25 ENCOUNTER — Emergency Department

## 2023-09-25 ENCOUNTER — Other Ambulatory Visit: Payer: Self-pay

## 2023-09-25 ENCOUNTER — Observation Stay
Admission: EM | Admit: 2023-09-25 | Discharge: 2023-09-26 | Disposition: A | Discharge status: Home / Self Care | Attending: Obstetrics and Gynecology | Admitting: Obstetrics and Gynecology

## 2023-09-25 ENCOUNTER — Observation Stay

## 2023-09-25 DIAGNOSIS — R9082 White matter disease, unspecified: Secondary | ICD-10-CM | POA: Diagnosis not present

## 2023-09-25 DIAGNOSIS — G309 Alzheimer's disease, unspecified: Secondary | ICD-10-CM | POA: Diagnosis present

## 2023-09-25 DIAGNOSIS — R42 Dizziness and giddiness: Secondary | ICD-10-CM | POA: Diagnosis not present

## 2023-09-25 DIAGNOSIS — E785 Hyperlipidemia, unspecified: Secondary | ICD-10-CM | POA: Diagnosis not present

## 2023-09-25 DIAGNOSIS — I6523 Occlusion and stenosis of bilateral carotid arteries: Secondary | ICD-10-CM | POA: Diagnosis not present

## 2023-09-25 DIAGNOSIS — Z7982 Long term (current) use of aspirin: Secondary | ICD-10-CM | POA: Diagnosis not present

## 2023-09-25 DIAGNOSIS — I10 Essential (primary) hypertension: Secondary | ICD-10-CM | POA: Diagnosis not present

## 2023-09-25 DIAGNOSIS — Z85828 Personal history of other malignant neoplasm of skin: Secondary | ICD-10-CM | POA: Insufficient documentation

## 2023-09-25 DIAGNOSIS — Z955 Presence of coronary angioplasty implant and graft: Secondary | ICD-10-CM | POA: Insufficient documentation

## 2023-09-25 DIAGNOSIS — R29818 Other symptoms and signs involving the nervous system: Secondary | ICD-10-CM | POA: Diagnosis not present

## 2023-09-25 DIAGNOSIS — R0789 Other chest pain: Secondary | ICD-10-CM | POA: Diagnosis not present

## 2023-09-25 DIAGNOSIS — G3184 Mild cognitive impairment, so stated: Secondary | ICD-10-CM | POA: Diagnosis not present

## 2023-09-25 DIAGNOSIS — Z79899 Other long term (current) drug therapy: Secondary | ICD-10-CM | POA: Diagnosis not present

## 2023-09-25 DIAGNOSIS — R079 Chest pain, unspecified: Secondary | ICD-10-CM | POA: Diagnosis not present

## 2023-09-25 DIAGNOSIS — N1831 Chronic kidney disease, stage 3a: Secondary | ICD-10-CM | POA: Insufficient documentation

## 2023-09-25 DIAGNOSIS — I129 Hypertensive chronic kidney disease with stage 1 through stage 4 chronic kidney disease, or unspecified chronic kidney disease: Secondary | ICD-10-CM | POA: Diagnosis not present

## 2023-09-25 DIAGNOSIS — R001 Bradycardia, unspecified: Secondary | ICD-10-CM | POA: Diagnosis not present

## 2023-09-25 DIAGNOSIS — I2510 Atherosclerotic heart disease of native coronary artery without angina pectoris: Secondary | ICD-10-CM | POA: Diagnosis not present

## 2023-09-25 DIAGNOSIS — I739 Peripheral vascular disease, unspecified: Secondary | ICD-10-CM | POA: Diagnosis not present

## 2023-09-25 LAB — BASIC METABOLIC PANEL WITH GFR
Anion gap: 7 (ref 5–15)
BUN: 18 mg/dL (ref 8–23)
CO2: 28 mmol/L (ref 22–32)
Calcium: 8 mg/dL — ABNORMAL LOW (ref 8.9–10.3)
Chloride: 105 mmol/L (ref 98–111)
Creatinine, Ser: 1.21 mg/dL (ref 0.61–1.24)
GFR, Estimated: 58 mL/min — ABNORMAL LOW (ref >60)
Glucose, Bld: 129 mg/dL — ABNORMAL HIGH (ref 70–99)
Potassium: 4 mmol/L (ref 3.5–5.1)
Sodium: 140 mmol/L (ref 135–145)

## 2023-09-25 LAB — CBC WITH DIFFERENTIAL/PLATELET
Abs Immature Granulocytes: 0.03 10*3/uL (ref 0.00–0.07)
Basophils Absolute: 0.1 10*3/uL (ref 0.0–0.1)
Basophils Relative: 1 %
Eosinophils Absolute: 0.3 10*3/uL (ref 0.0–0.5)
Eosinophils Relative: 2 %
HCT: 45.3 % (ref 39.0–52.0)
Hemoglobin: 15.2 g/dL (ref 13.0–17.0)
Immature Granulocytes: 0 %
Lymphocytes Relative: 13 %
Lymphs Abs: 1.5 10*3/uL (ref 0.7–4.0)
MCH: 31.7 pg (ref 26.0–34.0)
MCHC: 33.6 g/dL (ref 30.0–36.0)
MCV: 94.4 fL (ref 80.0–100.0)
Monocytes Absolute: 1 10*3/uL (ref 0.1–1.0)
Monocytes Relative: 9 %
Neutro Abs: 8.4 10*3/uL — ABNORMAL HIGH (ref 1.7–7.7)
Neutrophils Relative %: 75 %
Platelets: 223 10*3/uL (ref 150–400)
RBC: 4.8 MIL/uL (ref 4.22–5.81)
RDW: 12.8 % (ref 11.5–15.5)
WBC: 11.2 10*3/uL — ABNORMAL HIGH (ref 4.0–10.5)
nRBC: 0 % (ref 0.0–0.2)

## 2023-09-25 LAB — TROPONIN I (HIGH SENSITIVITY)
Troponin I (High Sensitivity): 10 ng/L (ref <18)
Troponin I (High Sensitivity): 11 ng/L (ref <18)
Troponin I (High Sensitivity): 12 ng/L (ref <18)
Troponin I (High Sensitivity): 11 ng/L (ref <18)

## 2023-09-25 MED ORDER — ONDANSETRON HCL 4 MG PO TABS: 4.0000 mg | ORAL_TABLET | Freq: Four times a day (QID) | ORAL | Status: DC | PRN

## 2023-09-25 MED ORDER — AMLODIPINE BESYLATE 5 MG PO TABS
5.0000 mg | ORAL_TABLET | Freq: Every day | ORAL | Status: DC
  Administered 2023-09-25 – 2023-09-26 (×2): 5 mg via ORAL
  Filled 2023-09-25 (×2): qty 1

## 2023-09-25 MED ORDER — DONEPEZIL HCL 5 MG PO TABS
10.0000 mg | ORAL_TABLET | Freq: Every day | ORAL | Status: DC
  Administered 2023-09-25: 10 mg via ORAL
  Filled 2023-09-25: qty 2

## 2023-09-25 MED ORDER — ONDANSETRON HCL 4 MG/2ML IJ SOLN: 4.0000 mg | Freq: Four times a day (QID) | INTRAMUSCULAR | Status: DC | PRN

## 2023-09-25 MED ORDER — ALBUTEROL SULFATE (2.5 MG/3ML) 0.083% IN NEBU: 2.5000 mg | INHALATION_SOLUTION | RESPIRATORY_TRACT | Status: DC | PRN

## 2023-09-25 MED ORDER — ENOXAPARIN SODIUM 40 MG/0.4ML IJ SOSY
40.0000 mg | PREFILLED_SYRINGE | INTRAMUSCULAR | Status: DC
  Administered 2023-09-25: 40 mg via SUBCUTANEOUS
  Filled 2023-09-25: qty 0.4

## 2023-09-25 MED ORDER — ASPIRIN 81 MG PO TBEC
81.0000 mg | DELAYED_RELEASE_TABLET | Freq: Every day | ORAL | Status: DC
Start: 2023-09-26 — End: 2023-09-26
  Administered 2023-09-26: 81 mg via ORAL
  Filled 2023-09-25: qty 1

## 2023-09-25 MED ORDER — ACETAMINOPHEN 325 MG PO TABS
650.0000 mg | ORAL_TABLET | Freq: Four times a day (QID) | ORAL | Status: DC | PRN
  Administered 2023-09-25: 650 mg via ORAL
  Filled 2023-09-25: qty 2

## 2023-09-25 MED ORDER — ACETAMINOPHEN 650 MG RE SUPP: 650.0000 mg | Freq: Four times a day (QID) | RECTAL | Status: DC | PRN

## 2023-09-25 MED ORDER — ATORVASTATIN CALCIUM 20 MG PO TABS
20.0000 mg | ORAL_TABLET | Freq: Every day | ORAL | Status: DC
  Administered 2023-09-26: 20 mg via ORAL
  Filled 2023-09-25: qty 1

## 2023-09-25 MED ORDER — NITROGLYCERIN 0.4 MG SL SUBL: 0.4000 mg | SUBLINGUAL_TABLET | SUBLINGUAL | Status: DC | PRN

## 2023-09-25 MED ORDER — TRAZODONE HCL 50 MG PO TABS
50.0000 mg | ORAL_TABLET | Freq: Every day | ORAL | Status: DC
  Administered 2023-09-25: 50 mg via ORAL
  Filled 2023-09-25: qty 1

## 2023-09-25 NOTE — ED Notes (Signed)
 Pt to CT

## 2023-09-25 NOTE — ED Notes (Signed)
 Pt gave verbal permission to provide updates to [facility], niece.

## 2023-09-25 NOTE — ED Provider Notes (Signed)
 [facility] Provider Note    Event Date/Time   First MD Initiated Contact with Patient 09/25/23 1238     (approximate)   History   Chief Complaint Chest Pain   HPI  Patrick Wilson is a 89 y.o. male with past medical history of hypertension, CAD, PAD, and Alzheimer's disease who presents to the ED complaining of chest pain.  Patient reports that he has been dealing with intermittent sharp pain in the left side of his chest over the past couple of days.  He describes the pain as worse with movement, but also can have pain at rest.  He denies any fevers, cough, or difficulty breathing, has not had any pain or swelling in his legs.  He does state that he has been feeling dizzy since the onset of this chest pain, which he describes as feeling like the room is spinning around him.  He denies any associated vision changes, speech changes, numbness, or focal weakness in his extremities but does describe feeling generally weak.     Physical Exam   Triage Vital Signs: ED Triage Vitals  Encounter Vitals Group     BP      Systolic BP Percentile      Diastolic BP Percentile      Pulse      Resp      Temp      Temp src      SpO2      Weight      Height      Head Circumference      Peak Flow      Pain Score      Pain Loc      Pain Education      Exclude from Growth Chart     Most recent vital signs: Vitals:   09/25/23 1245 09/25/23 1300  BP: (!) 164/75 (!) 152/61  Pulse: 79 82  Resp: 18 18  Temp:    SpO2: 100% 99%    Constitutional: Alert and oriented. Eyes: Conjunctivae are normal. Head: Atraumatic. Nose: No congestion/rhinnorhea. Mouth/Throat: Mucous membranes are moist.  Cardiovascular: Normal rate, regular rhythm. Grossly normal heart sounds.  2+ radial pulses bilaterally. Respiratory: Normal respiratory effort.  No retractions. Lungs CTAB. Gastrointestinal: Soft and nontender. No distention. Musculoskeletal: No lower extremity tenderness nor  edema.  Neurologic:  Normal speech and language. No gross focal neurologic deficits are appreciated.    ED Results / Procedures / Treatments   Labs (all labs ordered are listed, but only abnormal results are displayed) Labs Reviewed  CBC WITH DIFFERENTIAL/PLATELET - Abnormal; Notable for the following components:      Result Value   WBC 11.2 (*)    Neutro Abs 8.4 (*)    All other components within normal limits  BASIC METABOLIC PANEL WITH GFR - Abnormal; Notable for the following components:   Glucose, Bld 129 (*)    Calcium  8.0 (*)    GFR, Estimated 58 (*)    All other components within normal limits  TROPONIN I (HIGH SENSITIVITY)  TROPONIN I (HIGH SENSITIVITY)     EKG  ED ECG REPORT I, Twilla Galea, the attending physician, personally viewed and interpreted this ECG.   Date: 09/25/2023  EKG Time: 12:40  Rate: 78  Rhythm: normal sinus rhythm  Axis: Normal  Intervals:nonspecific intraventricular conduction delay  ST&T Change: None  RADIOLOGY Chest x-ray reviewed and interpreted by me with no infiltrate, edema, or effusion.  PROCEDURES:  Critical Care performed:  No  Procedures   MEDICATIONS ORDERED IN ED: Medications - No data to display   IMPRESSION / MDM / ASSESSMENT AND PLAN / ED COURSE  I reviewed the triage vital signs and the nursing notes.                              89 y.o. male with past medical history of hypertension, CAD, PAD, and Alzheimer's disease who presents to the ED complaining of intermittent left-sided chest pain over the past couple of days associated with dizziness.  Patient's presentation is most consistent with acute presentation with potential threat to life or bodily function.  Differential diagnosis includes, but is not limited to, ACS, PE, pneumonia, pneumothorax, musculoskeletal pain, GERD, anxiety, stroke, TIA, anemia, electrolyte abnormality, AKI.  Patient nontoxic-appearing and in no acute distress, vital signs are  unremarkable.  EKG shows no evidence of arrhythmia or ischemia and symptoms seem atypical for ACS, labs including troponin are pending at this time.  Chest x-ray is unremarkable, will check CT head given his dizziness and sensation of the room spinning, but no focal neurologic deficits noted on exam.  CT head is negative for acute process, labs without significant anemia, leukocytosis, electrolyte abnormality, or AKI.  Troponin within normal limits but will check second set.  Family at bedside states that patient has been unable to walk over the past couple of days, which is an acute change for him.  We will further assess with MRI to rule out stroke, case discussed with hospitalist for admission.      FINAL CLINICAL IMPRESSION(S) / ED DIAGNOSES   Final diagnoses:  Atypical chest pain  Dizziness     Rx / DC Orders   ED Discharge Orders     None        Note:  This document was prepared using Dragon voice recognition software and may include unintentional dictation errors.   Twilla Galea, MD 09/25/23 [phone]

## 2023-09-25 NOTE — H&P (Signed)
 History and Physical  Patrick Wilson:[id number] DOB: May 23, 1933 DOA: 09/25/2023  PCP: Sari Cunning, MD   Chief Complaint: Chest pain, dizziness  HPI: Patrick Wilson is a 89 y.o. male with medical history significant for CAD status post multiple RCA stents, hypertension, GERD being admitted to the hospital for evaluation of intermittent dizziness and chest pain.  Regarding the dizziness, patient states it is more of a fullness in the frontal part of his head, it seems to come and go.  It makes it a little bit difficult for him to move around, makes him feel little unsteady on his feet.  States this has been going on for weeks if not months, and his primary care physician is aware of the issue.  What actually brought him in today is chest pain, it is sharp, it is substernal, and nonradiating.  There is no associated shortness of breath, it is not pleuritic.  Denies any recent cough, nausea, vomiting, fever, chills or other concerns.  He was concerned about the chest pain, and knowing his history he called EMS.  He took an aspirin  and sublingual nitroglycerin  at home prior to being picked up by EMS, and his chest pain started to subside.  Currently he states that he has no chest pain, but intermittently he has a sudden twinge of pain which just lasts a few seconds and resolves on its own.  Review of Systems: Please see HPI for pertinent positives and negatives. A complete 10 system review of systems are otherwise negative.  Past Medical History:  Diagnosis Date   Cancer [facility])    SKIN   Coronary artery disease    Depression    GERD (gastroesophageal reflux disease)    History of kidney stones    Hypertension    Myocardial infarction [facility])    2012   Past Surgical History:  Procedure Laterality Date   cardiac stents     CATARACT EXTRACTION W/PHACO Left 10/24/2017   Procedure: CATARACT EXTRACTION PHACO AND INTRAOCULAR LENS PLACEMENT (IOC);  Surgeon: Clair Crews, MD;  Location: [facility] ORS;   Service: Ophthalmology;  Laterality: Left;  US  00:34.3 AP% 15.8 CDE 5.41 Fluid Pack lot # [id number]   CATARACT EXTRACTION W/PHACO Right 11/28/2017   Procedure: CATARACT EXTRACTION PHACO AND INTRAOCULAR LENS PLACEMENT (IOC);  Surgeon: Clair Crews, MD;  Location: [facility] ORS;  Service: Ophthalmology;  Laterality: Right;  US  00:32 AP% 11.3 CDE 3.66 Fluid pack lot # [id number]   CORONARY ANGIOPLASTY     STENT   CORONARY/GRAFT ACUTE MI REVASCULARIZATION N/A 10/17/2018   Procedure: Coronary/Graft Acute MI Revascularization;  Surgeon: Antonette Batters, MD;  Location: [facility] INVASIVE CV LAB;  Service: Cardiovascular;  Laterality: N/A;   EXTRACORPOREAL SHOCK WAVE LITHOTRIPSY Left 12/03/2015   Procedure: EXTRACORPOREAL SHOCK WAVE LITHOTRIPSY (ESWL);  Surgeon: Dustin Gimenez, MD;  Location: [facility] ORS;  Service: Urology;  Laterality: Left;   LEFT HEART CATH AND CORONARY ANGIOGRAPHY N/A 10/17/2018   Procedure: LEFT HEART CATH AND CORONARY ANGIOGRAPHY;  Surgeon: Antonette Batters, MD;  Location: [facility] INVASIVE CV LAB;  Service: Cardiovascular;  Laterality: N/A;   Social History:  reports that he has never smoked. His smokeless tobacco use includes chew. He reports current alcohol use. He reports that he does not currently use drugs.  Allergies  Allergen Reactions   Penicillins Rash and Other (See Comments)    Has patient had a PCN reaction causing immediate rash, facial/tongue/throat swelling, SOB or lightheadedness with hypotension: No Has patient had a PCN reaction  causing severe rash involving mucus membranes or skin necrosis: No Has patient had a PCN reaction that required hospitalization No Has patient had a PCN reaction occurring within the last 10 years: No If all of the above answers are "NO", then may proceed with Cephalosporin use.    History reviewed. No pertinent family history.   Prior to Admission medications   Medication Sig Start Date End Date Taking? Authorizing Provider  traZODone   (DESYREL ) 50 MG tablet Take 50 mg by mouth at bedtime. 08/24/23  Yes [provider]  amLODipine  (NORVASC ) 5 MG tablet Take 5 mg by mouth daily.    [provider]  aspirin  EC 81 MG tablet Take 81 mg by mouth daily.    [provider]  atorvastatin  (LIPITOR ) 20 MG tablet Take 20 mg by mouth daily.    [provider]  B Complex Vitamins (VITAMIN B COMPLEX PO) Take 1 tablet by mouth daily.  Patient not taking: Reported on 02/05/2021 08/10/15   [provider]  Denture Care Products (DENTURE ADHESIVE) CREA Apply 1 application topically daily as needed.  08/10/15   [provider]  diphenhydrAMINE  (BENADRYL ) 25 mg capsule Take 25 mg by mouth every 6 (six) hours as needed for allergies. Patient not taking: Reported on 02/05/2021    [provider]  docusate sodium  (COLACE) 100 MG capsule Take 1 capsule (100 mg total) by mouth 2 (two) times daily. Patient not taking: Reported on 04/01/2019 12/03/15   Dustin Gimenez, MD  hydrochlorothiazide  (HYDRODIURIL ) 25 MG tablet Take by mouth. 01/05/21   [provider]  lisinopril  (ZESTRIL ) 2.5 MG tablet Take 1 tablet (2.5 mg total) by mouth daily. 10/19/18 12/18/18  Sainani, Vivek J, MD  magnesium  gluconate (MAGONATE) 500 MG tablet Take 500 mg by mouth daily. Patient not taking: Reported on 02/05/2021    [provider]  meloxicam (MOBIC) 15 MG tablet Take 15 mg by mouth at bedtime.    [provider]  metoCLOPramide (REGLAN) 5 MG tablet Take 5 mg by mouth 2 (two) times daily. Patient not taking: Reported on 02/05/2021 10/03/18   [provider]  metoprolol  tartrate (LOPRESSOR ) 25 MG tablet Take 0.5 tablets (12.5 mg total) by mouth every 12 (twelve) hours. 10/18/18 12/17/18  Sainani, Vivek J, MD  Multiple Vitamins-Minerals (BL CENTURY SENIOR PO) Take 1 tablet by mouth daily. Patient not taking: Reported on 02/05/2021    [provider]  nitroGLYCERIN  (NITROSTAT ) 0.4 MG SL  tablet Place 0.4 mg under the tongue every 5 (five) minutes as needed for chest pain. Reported on 11/13/2015    [provider]  olmesartan (BENICAR) 20 MG tablet Take by mouth. 12/31/18 02/05/21  [provider]  Omega-3 Fatty Acids (FISH OIL) 1000 MG CAPS Take 1,000 mg by mouth daily. Patient not taking: Reported on 02/05/2021    [provider]  omeprazole (PRILOSEC) 40 MG capsule Take 40 mg by mouth 2 (two) times daily. Patient not taking: Reported on 02/05/2021 10/01/18   [provider]  oxyCODONE  (ROXICODONE ) 5 MG immediate release tablet Take 1 tablet (5 mg total) by mouth every 6 (six) hours as needed for up to 5 doses for breakthrough pain or severe pain. 06/08/22   Buell Carmin, MD  sucralfate (CARAFATE) 1 g tablet Take 1 g by mouth 4 (four) times daily. Patient not taking: Reported on 02/05/2021 10/10/18   [provider]  ticagrelor  (BRILINTA ) 90 MG TABS tablet Take by mouth. Patient not taking: Reported on 02/05/2021 12/14/18  [provider]  traZODone  (DESYREL ) 100 MG tablet Take 100 mg by mouth at bedtime. 06/25/18   [provider]  venlafaxine  XR (EFFEXOR -XR) 37.5 MG 24 hr capsule Take 37.5 mg by mouth daily. 09/04/17   [provider]    Physical Exam: BP (!) 152/61   Pulse 82   Temp 97.7 F (36.5 C) (Oral)   Resp 18   Ht 5\' 8"  (1.727 m)   Wt 82.2 kg   SpO2 99%   BMI 27.57 kg/m  General:  Alert, oriented, calm, in no acute distress, resting comfortably on room air.  Chest is nontender to palpation.  His niece and her husband are at the bedside. Cardiovascular: RRR, no murmurs or rubs, no peripheral edema  Respiratory: clear to auscultation bilaterally, no wheezes, no crackles  Abdomen: soft, nontender, nondistended, normal bowel tones heard  Skin: dry, no rashes  Musculoskeletal: no joint effusions, normal range of motion  Psychiatric: appropriate affect, normal speech  Neurologic: extraocular muscles intact,  clear speech, moving all extremities with intact sensorium         Labs on Admission:  Basic Metabolic Panel: Recent Labs  Lab 09/25/23 1255  NA 140  K 4.0  CL 105  CO2 28  GLUCOSE 129*  BUN 18  CREATININE 1.21  CALCIUM  8.0*   Liver Function Tests: No results for input(s): "AST", "ALT", "ALKPHOS", "BILITOT", "PROT", "ALBUMIN" in the last 168 hours. No results for input(s): "LIPASE", "AMYLASE" in the last 168 hours. No results for input(s): "AMMONIA" in the last 168 hours. CBC: Recent Labs  Lab 09/25/23 1255  WBC 11.2*  NEUTROABS 8.4*  HGB 15.2  HCT 45.3  MCV 94.4  PLT 223   Cardiac Enzymes: No results for input(s): "CKTOTAL", "CKMB", "CKMBINDEX", "TROPONINI" in the last 168 hours. BNP (last 3 results) No results for input(s): "BNP" in the last 8760 hours.  ProBNP (last 3 results) No results for input(s): "PROBNP" in the last 8760 hours.  CBG: No results for input(s): "GLUCAP" in the last 168 hours.  Radiological Exams on Admission: CT Head Wo Contrast Result Date: 09/25/2023 EXAM: CT HEAD WITHOUT 09/25/2023 01:32:00 PM TECHNIQUE: CT of the head was performed without the administration of intravenous contrast. Automated exposure control, iterative reconstruction, and/or weight based adjustment of the mA/kV was utilized to reduce the radiation dose to as low as reasonably achievable. COMPARISON: MR head without contrast 08/31/2022. CT head without contrast 08/31/2022. CLINICAL HISTORY: Neuro deficit, acute, stroke suspected. FINDINGS: BRAIN AND VENTRICLES: There is no acute intracranial hemorrhage, mass effect or midline shift. No abnormal extra-axial fluid collection. The gray-white differentiation is maintained without evidence of an acute infarct. There is no evidence of hydrocephalus. Periventricular and scattered subcortical white matter hypoattenuation is similar to the prior study. ORBITS: Bilateral lens replacements are noted. The globes and orbits are otherwise  within normal limits. SINUSES: The visualized paranasal sinuses and mastoid air cells demonstrate no acute abnormality. SOFT TISSUES AND SKULL: No acute abnormality of the visualized skull or soft tissues. VASCULATURE: Atherosclerotic calcifications are present in the cavernous carotid arteries bilaterally. No hyperdense vessel is present. IMPRESSION: 1. No acute intracranial abnormality. 2. Periventricular and scattered subcortical white matter disease, similar to the prior study. Electronically signed by: Audree Leas MD 09/25/2023 01:44 PM EDT RP Workstation: [id number]   DG Chest 2 View Result Date: 09/25/2023 CLINICAL DATA:  Acute central chest pain. EXAM: CHEST - 2 VIEW COMPARISON:  08/31/2022 FINDINGS: The heart size and mediastinal contours are within normal  limits. Both lungs are clear. The visualized skeletal structures are unremarkable. IMPRESSION: No active cardiopulmonary disease. Electronically Signed   By: Marlyce Sine M.D.   On: 09/25/2023 13:10   Assessment/Plan Patrick Wilson is a 89 y.o. male with medical history significant for CAD status post multiple RCA stents, hypertension, GERD being admitted to the hospital for evaluation of intermittent dizziness and chest pain.    Chest pain-in the setting of known coronary artery disease and history of mid RCA stent in 2012, with in-stent restenosis 2013.  He also suffered inferior STEMI with ostial RCA stent in 2020.  Notably, he was admitted to the hospital in 2020 with chest pain and negative troponins, he had a syncopal episode during his stress test and was taken urgently to Cath Lab with a STEMI. -Observation admission -Continue to trend troponin -Continue daily aspirin  -Telemetry monitoring -Sublingual nitro in case of recurrent chest pain -Consider inpatient cardiology evaluation, pending the above  Dizziness-this is a chronic problem for the patient, has been affecting him intermittently.  Noncontrast CT of the head as  above without any acute findings.  ER provider has ordered MRI brain.  Currently, patient states that he has ambulated in the room and to the bathroom and back, without dizziness. -Follow-up MRI brain -PT/OT  Hypertension-Norvasc   Hyperlipidemia-Lipitor   DVT prophylaxis: Lovenox     Code Status: Limited: Do not attempt resuscitation (DNR) -DNR-LIMITED -Do Not Intubate/DNI , confirmed with the patient and his niece at the bedside at the time of admission.  Consults called: None  Admission status: Observation  Time spent: 56 minutes  Patrick Wilson Charm MD [facility] Pager [phone]  If 7PM-7AM, please contact night-coverage [email or web address] Password [facility]  09/25/2023, 2:48 PM

## 2023-09-25 NOTE — ED Triage Notes (Signed)
 Pt to ED via [facility] from home for c/o chest pain that began 25 minutes prior to EMS arrival, described as stinging, non-radiating, initially 5/10, central chest. Pt states no pain currently. Pt took 1 nitroglycerin  prior to EMS arrival, given 324 aspirin  by EMS.

## 2023-09-25 NOTE — ED Notes (Signed)
 Patient transported to MRI

## 2023-09-26 DIAGNOSIS — I2510 Atherosclerotic heart disease of native coronary artery without angina pectoris: Secondary | ICD-10-CM | POA: Insufficient documentation

## 2023-09-26 DIAGNOSIS — R0789 Other chest pain: Secondary | ICD-10-CM | POA: Diagnosis not present

## 2023-09-26 DIAGNOSIS — N1831 Chronic kidney disease, stage 3a: Secondary | ICD-10-CM | POA: Insufficient documentation

## 2023-09-26 LAB — BASIC METABOLIC PANEL WITH GFR
Anion gap: 6 (ref 5–15)
BUN: 20 mg/dL (ref 8–23)
CO2: 24 mmol/L (ref 22–32)
Calcium: 8.7 mg/dL — ABNORMAL LOW (ref 8.9–10.3)
Chloride: 108 mmol/L (ref 98–111)
Creatinine, Ser: 1.28 mg/dL — ABNORMAL HIGH (ref 0.61–1.24)
GFR, Estimated: 54 mL/min — ABNORMAL LOW (ref >60)
Glucose, Bld: 88 mg/dL (ref 70–99)
Potassium: 4 mmol/L (ref 3.5–5.1)
Sodium: 138 mmol/L (ref 135–145)

## 2023-09-26 LAB — CBC
HCT: 39.2 % (ref 39.0–52.0)
Hemoglobin: 13.6 g/dL (ref 13.0–17.0)
MCH: 31.9 pg (ref 26.0–34.0)
MCHC: 34.7 g/dL (ref 30.0–36.0)
MCV: 92 fL (ref 80.0–100.0)
Platelets: 223 10*3/uL (ref 150–400)
RBC: 4.26 MIL/uL (ref 4.22–5.81)
RDW: 13.1 % (ref 11.5–15.5)
WBC: 11.2 10*3/uL — ABNORMAL HIGH (ref 4.0–10.5)
nRBC: 0 % (ref 0.0–0.2)

## 2023-09-26 NOTE — Progress Notes (Signed)
 Transition of Care [facility]) - Inpatient Brief Assessment   Patient Details  Name: Patrick Wilson MRN: [id number] Date of Birth: January 17, 1934  [facility] CM/SW Contact:    Zollie Ellery C Khalen Styer, RN Phone Number: 09/26/2023, 10:00 AM   Clinical Narrative: TOC continuing to follow patient's progress throughout discharge planning.   Transition of Care Asessment: Insurance and Status: Insurance coverage has been reviewed Patient has primary care physician: Yes   Prior level of function:: Independent Prior/Current Home Services: No current home services Social Drivers of Health Review: SDOH reviewed no interventions necessary

## 2023-09-26 NOTE — Progress Notes (Signed)
 CCMD called and said pt's HR went down to 29, but non sustained, pt is asleep, other VSS. HR currently 50's. asked CCMD to save a strip to [facility] for provider to review. pt did not received any cardiac medications currently. Pt came in for chest pain and dizziness. Notify Dr. Vallarie Gauze. No other concern at the moment. Plan of care continued.

## 2023-09-26 NOTE — Plan of Care (Signed)
  Problem: Clinical Measurements: Goal: Ability to maintain clinical measurements within normal limits will improve Outcome: Progressing Goal: Will remain free from infection Outcome: Progressing Goal: Diagnostic test results will improve Outcome: Progressing Goal: Cardiovascular complication will be avoided Outcome: Progressing   Problem: Activity: Goal: Risk for activity intolerance will decrease Outcome: Progressing   Problem: Pain Managment: Goal: General experience of comfort will improve and/or be controlled Outcome: Progressing   Problem: Safety: Goal: Ability to remain free from injury will improve Outcome: Progressing

## 2023-09-26 NOTE — Discharge Summary (Signed)
 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 male with medical history significant for CAD status post multiple RCA stents, hypertension, GERD being admitted to the hospital for evaluation of intermittent dizziness and chest pain.  Regarding the dizziness, patient states it is more of a fullness in the frontal part of his head, it seems to come and go.  It makes it a little bit difficult for him to move around, makes him feel little unsteady on his feet.  States this has been going on for weeks if not months, and his primary care physician is aware of the issue.  What actually brought him in today is chest pain, it is sharp, it is substernal, and nonradiating.  There is no associated shortness of breath, it is not pleuritic.  Denies any recent cough, nausea, vomiting, fever, chills or other concerns.  He was concerned about the chest pain, and knowing his history he called EMS.  He took an aspirin  and sublingual nitroglycerin  at home prior to being picked up by EMS, and his chest pain started to subside.  Currently he states that he has no chest pain, but intermittently he has a sudden twinge of pain which just lasts a few seconds and resolves on its own.   Hospital Course:  Patient presents with concern for lightheadedness and  chest pain. Lightheadedness is long-standing and intermittent. Chest pain developed day of admission, mild, substernal, no radiation. For the chest pain patient was treated with aspirin , monitored with EKG and telemetry, and monitored with serial troponins. No ischemic changes on EKG, troponins negative x3. Chest pain resolved. For intermittent lightheadedness EKG reveals no high degree block but tele reveals bradycardia. MRI showed nothing acute. This does not appear to be acs or other acute cardiopulmonary process, patient is stable for d/c home. For lightheadedness with bradycardia I advised holding home metoprolol , and I advised close f/u with cardiology. Other chronic medical problems stable, patient is feeling back to baseline and requests discharge.   Procedures: none   Consultations: none  Discharge Exam: Vitals:   09/26/23 0258 09/26/23 0721  BP: 114/64 121/69  Pulse: (!) 59 (!) 57  Resp: 18 18  Temp: 98.1 F (36.7 C) 97.9 F (36.6 C)  SpO2: 97% 94%    General: NAD Cardiovascular: RRR, distant heart sounds Respiratory: CTAB  Discharge Instructions   Discharge Instructions     Diet - low sodium heart healthy   Complete by: As directed    Increase activity slowly   Complete by: As directed       Allergies as of 09/26/2023       Reactions   Penicillins Rash, Other (See Comments)   Has patient had a PCN reaction causing immediate rash, facial/tongue/throat swelling, SOB or lightheadedness with hypotension: No Has patient had a  PCN reaction causing severe rash involving mucus membranes or skin necrosis: No Has patient had a PCN reaction that required hospitalization No Has patient had a PCN reaction occurring within the last 10 years: No If all of the above answers are "NO", then may proceed with Cephalosporin use.        Medication List     PAUSE taking these medications    metoprolol  tartrate 25 MG tablet Wait to take this until your doctor or other care  provider tells you to start again. Commonly known as: LOPRESSOR  Take 0.5 tablets (12.5 mg total) by mouth every 12 (twelve) hours.       STOP taking these medications    docusate sodium  100 MG capsule Commonly known as: COLACE   metoCLOPramide 5 MG tablet Commonly known as: REGLAN   omeprazole 40 MG capsule Commonly known as: PRILOSEC   sucralfate 1 g tablet Commonly known as: CARAFATE       TAKE these medications    amLODipine  5 MG tablet Commonly known as: NORVASC  Take 5 mg by mouth daily.   aspirin  EC 81 MG tablet Take 81 mg by mouth daily.   atorvastatin  20 MG tablet Commonly known as: LIPITOR  Take 20 mg by mouth daily.   BL CENTURY SENIOR PO Take 1 tablet by mouth daily.   Denture Adhesive Crea Apply 1 application topically daily as needed.   diphenhydrAMINE  25 mg capsule Commonly known as: BENADRYL  Take 25 mg by mouth every 6 (six) hours as needed for allergies.   Fish Oil 1000 MG Caps Take 1,000 mg by mouth daily.   hydrochlorothiazide  25 MG tablet Commonly known as: HYDRODIURIL  Take by mouth.   lisinopril  2.5 MG tablet Commonly known as: ZESTRIL  Take 1 tablet (2.5 mg total) by mouth daily.   magnesium  gluconate 500 MG tablet Commonly known as: MAGONATE Take 500 mg by mouth daily.   meloxicam 15 MG tablet Commonly known as: MOBIC Take 15 mg by mouth at bedtime.   nitroGLYCERIN  0.4 MG SL tablet Commonly known as: NITROSTAT  Place 0.4 mg under the tongue every 5 (five) minutes as needed for chest pain. Reported on 11/13/2015   olmesartan 20 MG tablet Commonly known as: BENICAR Take by mouth.   oxyCODONE  5 MG immediate release tablet Commonly known as: Roxicodone  Take 1 tablet (5 mg total) by mouth every 6 (six) hours as needed for up to 5 doses for breakthrough pain or severe pain.   ticagrelor  90 MG Tabs tablet Commonly known as: BRILINTA  Take by mouth.   traZODone  100 MG tablet Commonly known as: DESYREL  Take 100 mg by mouth at  bedtime.   traZODone  50 MG tablet Commonly known as: DESYREL  Take 50 mg by mouth at bedtime.   venlafaxine  XR 37.5 MG 24 hr capsule Commonly known as: EFFEXOR -XR Take 37.5 mg by mouth daily.   VITAMIN B COMPLEX PO Take 1 tablet by mouth daily.       Allergies  Allergen Reactions   Penicillins Rash and Other (See Comments)    Has patient had a PCN reaction causing immediate rash, facial/tongue/throat swelling, SOB or lightheadedness with hypotension: No Has patient had a PCN reaction causing severe rash involving mucus membranes or skin necrosis: No Has patient had a PCN reaction that required hospitalization No Has patient had a PCN reaction occurring within the last 10 years: No If all of the above answers are "NO", then may proceed with Cephalosporin use.    Follow-up Information     Anthonette Bastos,  Antony Baumgartner, PA-C Follow up.   Contact information: [address] [facility] [place] [address] [phone]         Sari Cunning, MD Follow up.   Specialty: Internal Medicine Contact information: [phone] [facility] [address] [facility] [place] [address] [phone]                  The results of significant diagnostics from this hospitalization (including imaging, microbiology, ancillary and laboratory) are listed below for reference.    Significant Diagnostic Studies: MR BRAIN WO CONTRAST Result Date: 09/25/2023 EXAM: MRI BRAIN WITHOUT CONTRAST 09/25/2023 03:33:56 PM TECHNIQUE: Multiplanar multisequence MRI of the head/brain was performed without the administration of intravenous contrast. COMPARISON: CT head without contrast 09/25/2023. MR head without contrast 08/31/2022. CLINICAL HISTORY: Neuro deficit, acute, stroke suspected. Intermittent dizziness and fullness in the front of his head. Unsteady on feet. FINDINGS: BRAIN AND VENTRICLES: No acute infarct. No intracranial hemorrhage. No mass. No midline shift. No hydrocephalus. The  sella is unremarkable. Normal flow voids. Periventricular and subcortical white matter T2 hyperintensities are mildly advanced for age, similar to the prior exam. ORBITS: Bilateral lens replacements are noted. The globes and orbits are otherwise within normal limits. SINUSES AND MASTOIDS: No acute abnormality. BONES AND SOFT TISSUES: Normal marrow signal. No acute soft tissue abnormality. IMPRESSION: 1. No acute intracranial abnormality. 2. Mildly advanced periventricular and subcortical white matter T2 hyperintensities, similar to the prior exam. Electronically signed by: Audree Leas MD 09/25/2023 03:43 PM EDT RP Workstation: [id number]   CT Head Wo Contrast Result Date: 09/25/2023 EXAM: CT HEAD WITHOUT 09/25/2023 01:32:00 PM TECHNIQUE: CT of the head was performed without the administration of intravenous contrast. Automated exposure control, iterative reconstruction, and/or weight based adjustment of the mA/kV was utilized to reduce the radiation dose to as low as reasonably achievable. COMPARISON: MR head without contrast 08/31/2022. CT head without contrast 08/31/2022. CLINICAL HISTORY: Neuro deficit, acute, stroke suspected. FINDINGS: BRAIN AND VENTRICLES: There is no acute intracranial hemorrhage, mass effect or midline shift. No abnormal extra-axial fluid collection. The gray-white differentiation is maintained without evidence of an acute infarct. There is no evidence of hydrocephalus. Periventricular and scattered subcortical white matter hypoattenuation is similar to the prior study. ORBITS: Bilateral lens replacements are noted. The globes and orbits are otherwise within normal limits. SINUSES: The visualized paranasal sinuses and mastoid air cells demonstrate no acute abnormality. SOFT TISSUES AND SKULL: No acute abnormality of the visualized skull or soft tissues. VASCULATURE: Atherosclerotic calcifications are present in the cavernous carotid arteries bilaterally. No hyperdense vessel is  present. IMPRESSION: 1. No acute intracranial abnormality. 2. Periventricular and scattered subcortical white matter disease, similar to the prior study. Electronically signed by: Audree Leas MD 09/25/2023 01:44 PM EDT RP Workstation: [id number]   DG Chest 2 View Result Date: 09/25/2023 CLINICAL DATA:  Acute central chest pain. EXAM: CHEST - 2 VIEW COMPARISON:  08/31/2022 FINDINGS: The heart size and mediastinal contours are within normal limits. Both lungs are clear. The visualized skeletal structures are unremarkable. IMPRESSION: No active cardiopulmonary disease. Electronically Signed   By: Marlyce Sine M.D.   On: 09/25/2023 13:10    Microbiology: No results found for this or any previous visit (from the past 240 hours).   Labs: Basic Metabolic Panel: Recent Labs  Lab 09/25/23 1255 09/26/23 0452  NA 140 138  K 4.0 4.0  CL 105 108  CO2 28 24  GLUCOSE 129* 88  BUN 18 20  CREATININE 1.21 1.28*  CALCIUM  8.0* 8.7*   Liver Function Tests: No results for input(s): "AST", "ALT", "ALKPHOS", "BILITOT", "PROT", "ALBUMIN" in the last 168 hours. No results for input(s): "LIPASE", "AMYLASE" in the last 168 hours. No results for input(s): "AMMONIA" in the last 168 hours. CBC: Recent Labs  Lab 09/25/23 1255 09/26/23 0452  WBC 11.2* 11.2*  NEUTROABS 8.4*  --   HGB 15.2 13.6  HCT 45.3 39.2  MCV 94.4 92.0  PLT 223 223   Cardiac Enzymes: No results for input(s): "CKTOTAL", "CKMB", "CKMBINDEX", "TROPONINI" in the last 168 hours. BNP: BNP (last 3 results) No results for input(s): "BNP" in the last 8760 hours.  ProBNP (last 3 results) No results for input(s): "PROBNP" in the last 8760 hours.  CBG: No results for input(s): "GLUCAP" in the last 168 hours.     Signed:  Raymonde Calico MD.  [facility] 09/26/2023, 9:19 AM

## 2024-01-22 DIAGNOSIS — M62830 Muscle spasm of back: Secondary | ICD-10-CM | POA: Diagnosis not present

## 2024-01-22 DIAGNOSIS — M9903 Segmental and somatic dysfunction of lumbar region: Secondary | ICD-10-CM | POA: Diagnosis not present

## 2024-01-22 DIAGNOSIS — M955 Acquired deformity of pelvis: Secondary | ICD-10-CM | POA: Diagnosis not present

## 2024-01-22 DIAGNOSIS — Z23 Encounter for immunization: Secondary | ICD-10-CM | POA: Diagnosis not present

## 2024-01-22 DIAGNOSIS — Z125 Encounter for screening for malignant neoplasm of prostate: Secondary | ICD-10-CM | POA: Diagnosis not present

## 2024-01-22 DIAGNOSIS — I739 Peripheral vascular disease, unspecified: Secondary | ICD-10-CM | POA: Diagnosis not present

## 2024-01-22 DIAGNOSIS — N184 Chronic kidney disease, stage 4 (severe): Secondary | ICD-10-CM | POA: Diagnosis not present

## 2024-01-22 DIAGNOSIS — Z79899 Other long term (current) drug therapy: Secondary | ICD-10-CM | POA: Diagnosis not present

## 2024-01-22 DIAGNOSIS — G301 Alzheimer's disease with late onset: Secondary | ICD-10-CM | POA: Diagnosis not present

## 2024-01-22 DIAGNOSIS — M9905 Segmental and somatic dysfunction of pelvic region: Secondary | ICD-10-CM | POA: Diagnosis not present
